# Patient Record
Sex: Male | Born: 1960 | Race: White | Hispanic: No | Marital: Married
Health system: Southern US, Academic
[De-identification: ages and names within clinical notes are randomized; demographics above are authoritative.]

## PROBLEM LIST (undated history)

## (undated) ENCOUNTER — Encounter

## (undated) ENCOUNTER — Ambulatory Visit

## (undated) ENCOUNTER — Encounter: Attending: Family | Primary: Family

## (undated) ENCOUNTER — Encounter: Attending: Infectious Disease | Primary: Infectious Disease

## (undated) ENCOUNTER — Telehealth

## (undated) ENCOUNTER — Ambulatory Visit: Payer: PRIVATE HEALTH INSURANCE

## (undated) ENCOUNTER — Encounter: Attending: Internal Medicine | Primary: Internal Medicine

## (undated) ENCOUNTER — Telehealth: Attending: Family | Primary: Family

## (undated) ENCOUNTER — Ambulatory Visit: Payer: Medicare (Managed Care)

## (undated) ENCOUNTER — Encounter: Attending: Pulmonary Disease | Primary: Pulmonary Disease

## (undated) ENCOUNTER — Encounter
Attending: Student in an Organized Health Care Education/Training Program | Primary: Student in an Organized Health Care Education/Training Program

## (undated) ENCOUNTER — Encounter
Attending: Pharmacist Clinician (PhC)/ Clinical Pharmacy Specialist | Primary: Pharmacist Clinician (PhC)/ Clinical Pharmacy Specialist

## (undated) ENCOUNTER — Ambulatory Visit: Payer: BLUE CROSS/BLUE SHIELD

## (undated) ENCOUNTER — Ambulatory Visit
Payer: BLUE CROSS/BLUE SHIELD | Attending: Student in an Organized Health Care Education/Training Program | Primary: Student in an Organized Health Care Education/Training Program

## (undated) ENCOUNTER — Ambulatory Visit: Payer: BLUE CROSS/BLUE SHIELD | Attending: Internal Medicine | Primary: Internal Medicine

## (undated) ENCOUNTER — Telehealth
Attending: Student in an Organized Health Care Education/Training Program | Primary: Student in an Organized Health Care Education/Training Program

## (undated) ENCOUNTER — Ambulatory Visit
Payer: PRIVATE HEALTH INSURANCE | Attending: Student in an Organized Health Care Education/Training Program | Primary: Student in an Organized Health Care Education/Training Program

## (undated) ENCOUNTER — Other Ambulatory Visit

## (undated) ENCOUNTER — Ambulatory Visit: Payer: PRIVATE HEALTH INSURANCE | Attending: Registered" | Primary: Registered"

## (undated) ENCOUNTER — Ambulatory Visit
Attending: Student in an Organized Health Care Education/Training Program | Primary: Student in an Organized Health Care Education/Training Program

## (undated) ENCOUNTER — Telehealth: Attending: Otolaryngology | Primary: Otolaryngology

## (undated) ENCOUNTER — Encounter: Payer: PRIVATE HEALTH INSURANCE | Attending: Infectious Disease | Primary: Infectious Disease

## (undated) ENCOUNTER — Ambulatory Visit: Payer: BLUE CROSS/BLUE SHIELD | Attending: Family | Primary: Family

## (undated) ENCOUNTER — Encounter: Attending: Dermatology | Primary: Dermatology

## (undated) ENCOUNTER — Ambulatory Visit: Payer: PRIVATE HEALTH INSURANCE | Attending: Internal Medicine | Primary: Internal Medicine

## (undated) ENCOUNTER — Ambulatory Visit: Payer: PRIVATE HEALTH INSURANCE | Attending: MOHS-Micrographic Surgery | Primary: MOHS-Micrographic Surgery

## (undated) ENCOUNTER — Non-Acute Institutional Stay: Payer: PRIVATE HEALTH INSURANCE

## (undated) ENCOUNTER — Ambulatory Visit: Payer: BLUE CROSS/BLUE SHIELD | Attending: Infectious Disease | Primary: Infectious Disease

## (undated) ENCOUNTER — Telehealth: Attending: Internal Medicine | Primary: Internal Medicine

## (undated) ENCOUNTER — Ambulatory Visit: Payer: PRIVATE HEALTH INSURANCE | Attending: Family | Primary: Family

## (undated) ENCOUNTER — Telehealth: Attending: Infectious Disease | Primary: Infectious Disease

## (undated) ENCOUNTER — Encounter: Attending: MOHS-Micrographic Surgery | Primary: MOHS-Micrographic Surgery

## (undated) DIAGNOSIS — J189 Pneumonia, unspecified organism: Secondary | ICD-10-CM

## (undated) DIAGNOSIS — E669 Obesity, unspecified: Secondary | ICD-10-CM

## (undated) DIAGNOSIS — Q348 Other specified congenital malformations of respiratory system: Secondary | ICD-10-CM

## (undated) DIAGNOSIS — R0602 Shortness of breath: Secondary | ICD-10-CM

## (undated) DIAGNOSIS — L309 Dermatitis, unspecified: Secondary | ICD-10-CM

## (undated) HISTORY — PX: APPENDECTOMY: SHX54

## (undated) HISTORY — DX: Dermatitis, unspecified: L30.9

## (undated) HISTORY — DX: Obesity, unspecified: E66.9

## (undated) HISTORY — PX: HERNIA REPAIR: SHX51

## (undated) HISTORY — PX: TONSILLECTOMY: SUR1361

## (undated) HISTORY — DX: Other specified congenital malformations of respiratory system: Q34.8

## (undated) HISTORY — PX: NASAL POLYP EXCISION: SHX2068

## (undated) HISTORY — DX: Pneumonia, unspecified organism: J18.9

---
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
## 2000-08-07 ENCOUNTER — Encounter: Payer: Self-pay | Admitting: Surgery

## 2000-08-07 ENCOUNTER — Encounter (INDEPENDENT_AMBULATORY_CARE_PROVIDER_SITE_OTHER): Payer: Self-pay | Admitting: Specialist

## 2000-08-07 ENCOUNTER — Inpatient Hospital Stay (HOSPITAL_COMMUNITY): Admission: EM | Admit: 2000-08-07 | Discharge: 2000-08-09 | Payer: Self-pay

## 2000-08-08 ENCOUNTER — Encounter: Payer: Self-pay | Admitting: General Surgery

## 2000-08-11 ENCOUNTER — Encounter: Payer: Self-pay | Admitting: Emergency Medicine

## 2000-08-11 ENCOUNTER — Emergency Department (HOSPITAL_COMMUNITY): Admission: EM | Admit: 2000-08-11 | Discharge: 2000-08-11 | Payer: Self-pay | Admitting: Emergency Medicine

## 2000-08-28 ENCOUNTER — Encounter: Admission: RE | Admit: 2000-08-28 | Discharge: 2000-08-28 | Payer: Self-pay | Admitting: Surgery

## 2000-08-28 ENCOUNTER — Encounter: Payer: Self-pay | Admitting: Surgery

## 2002-03-03 ENCOUNTER — Encounter: Payer: Self-pay | Admitting: Emergency Medicine

## 2002-03-03 ENCOUNTER — Emergency Department (HOSPITAL_COMMUNITY): Admission: EM | Admit: 2002-03-03 | Discharge: 2002-03-03 | Payer: Self-pay | Admitting: Emergency Medicine

## 2004-04-21 ENCOUNTER — Ambulatory Visit: Payer: Self-pay | Admitting: Internal Medicine

## 2004-09-15 ENCOUNTER — Ambulatory Visit: Payer: Self-pay | Admitting: Internal Medicine

## 2005-01-12 ENCOUNTER — Ambulatory Visit: Payer: Self-pay | Admitting: Internal Medicine

## 2005-12-19 ENCOUNTER — Ambulatory Visit: Payer: Self-pay | Admitting: Internal Medicine

## 2006-02-06 ENCOUNTER — Ambulatory Visit: Payer: Self-pay | Admitting: Internal Medicine

## 2006-09-18 ENCOUNTER — Ambulatory Visit: Payer: Self-pay | Admitting: Pulmonary Disease

## 2006-11-25 DIAGNOSIS — J310 Chronic rhinitis: Secondary | ICD-10-CM | POA: Insufficient documentation

## 2006-11-25 DIAGNOSIS — Q348 Other specified congenital malformations of respiratory system: Secondary | ICD-10-CM | POA: Insufficient documentation

## 2006-11-25 DIAGNOSIS — J42 Unspecified chronic bronchitis: Secondary | ICD-10-CM | POA: Insufficient documentation

## 2006-12-31 ENCOUNTER — Telehealth (INDEPENDENT_AMBULATORY_CARE_PROVIDER_SITE_OTHER): Payer: Self-pay | Admitting: *Deleted

## 2007-01-30 ENCOUNTER — Encounter: Payer: Self-pay | Admitting: Internal Medicine

## 2007-02-04 ENCOUNTER — Telehealth (INDEPENDENT_AMBULATORY_CARE_PROVIDER_SITE_OTHER): Payer: Self-pay | Admitting: *Deleted

## 2007-02-11 ENCOUNTER — Telehealth: Payer: Self-pay | Admitting: Internal Medicine

## 2007-02-14 ENCOUNTER — Ambulatory Visit: Payer: Self-pay | Admitting: Internal Medicine

## 2007-02-14 DIAGNOSIS — I270 Primary pulmonary hypertension: Secondary | ICD-10-CM

## 2007-02-14 DIAGNOSIS — J329 Chronic sinusitis, unspecified: Secondary | ICD-10-CM | POA: Insufficient documentation

## 2007-04-23 ENCOUNTER — Encounter: Payer: Self-pay | Admitting: Internal Medicine

## 2007-08-08 ENCOUNTER — Telehealth: Payer: Self-pay | Admitting: Internal Medicine

## 2007-11-26 ENCOUNTER — Telehealth (INDEPENDENT_AMBULATORY_CARE_PROVIDER_SITE_OTHER): Payer: Self-pay | Admitting: *Deleted

## 2007-12-31 ENCOUNTER — Encounter: Payer: Self-pay | Admitting: Internal Medicine

## 2008-01-06 ENCOUNTER — Ambulatory Visit: Payer: Self-pay | Admitting: Internal Medicine

## 2008-01-12 ENCOUNTER — Encounter: Payer: Self-pay | Admitting: Internal Medicine

## 2008-01-15 ENCOUNTER — Ambulatory Visit: Payer: Self-pay | Admitting: Internal Medicine

## 2008-01-16 DIAGNOSIS — R0602 Shortness of breath: Secondary | ICD-10-CM | POA: Insufficient documentation

## 2008-01-28 ENCOUNTER — Telehealth: Payer: Self-pay | Admitting: Internal Medicine

## 2008-02-02 ENCOUNTER — Telehealth (INDEPENDENT_AMBULATORY_CARE_PROVIDER_SITE_OTHER): Payer: Self-pay | Admitting: *Deleted

## 2008-02-03 ENCOUNTER — Encounter: Payer: Self-pay | Admitting: Internal Medicine

## 2008-02-03 LAB — CONVERTED CEMR LAB
ALT: 22 units/L (ref 0–53)
AST: 22 units/L (ref 0–37)
Albumin: 3.7 g/dL (ref 3.5–5.2)
Alkaline Phosphatase: 50 units/L (ref 39–117)
BUN: 13 mg/dL (ref 6–23)
Basophils Absolute: 0 10*3/uL (ref 0.0–0.1)
Basophils Relative: 0.3 % (ref 0.0–3.0)
Bilirubin, Direct: 0.1 mg/dL (ref 0.0–0.3)
CO2: 31 meq/L (ref 19–32)
Calcium: 9.4 mg/dL (ref 8.4–10.5)
Chloride: 108 meq/L (ref 96–112)
Creatinine, Ser: 1.1 mg/dL (ref 0.4–1.5)
Eosinophils Absolute: 0.1 10*3/uL (ref 0.0–0.7)
Eosinophils Relative: 2.3 % (ref 0.0–5.0)
GFR calc Af Amer: 93 mL/min
GFR calc non Af Amer: 77 mL/min
Glucose, Bld: 93 mg/dL (ref 70–99)
HCT: 43.5 % (ref 39.0–52.0)
Hemoglobin: 15.2 g/dL (ref 13.0–17.0)
Lymphocytes Relative: 30.3 % (ref 12.0–46.0)
MCHC: 35 g/dL (ref 30.0–36.0)
MCV: 90.1 fL (ref 78.0–100.0)
Monocytes Absolute: 0.4 10*3/uL (ref 0.1–1.0)
Monocytes Relative: 7.7 % (ref 3.0–12.0)
Neutro Abs: 3.5 10*3/uL (ref 1.4–7.7)
Neutrophils Relative %: 59.4 % (ref 43.0–77.0)
Platelets: 173 10*3/uL (ref 150–400)
Potassium: 4.7 meq/L (ref 3.5–5.1)
RBC: 4.83 M/uL (ref 4.22–5.81)
RDW: 12.1 % (ref 11.5–14.6)
Sodium: 143 meq/L (ref 135–145)
Total Bilirubin: 1 mg/dL (ref 0.3–1.2)
Total Protein: 6.7 g/dL (ref 6.0–8.3)
WBC: 5.7 10*3/uL (ref 4.5–10.5)

## 2008-02-04 ENCOUNTER — Encounter: Payer: Self-pay | Admitting: Internal Medicine

## 2008-04-06 ENCOUNTER — Encounter: Payer: Self-pay | Admitting: Internal Medicine

## 2008-08-05 ENCOUNTER — Ambulatory Visit: Payer: Self-pay | Admitting: Internal Medicine

## 2008-09-24 ENCOUNTER — Telehealth: Payer: Self-pay | Admitting: Internal Medicine

## 2008-10-19 ENCOUNTER — Telehealth (INDEPENDENT_AMBULATORY_CARE_PROVIDER_SITE_OTHER): Payer: Self-pay | Admitting: *Deleted

## 2008-12-14 ENCOUNTER — Encounter: Payer: Self-pay | Admitting: Internal Medicine

## 2008-12-28 ENCOUNTER — Encounter: Payer: Self-pay | Admitting: Internal Medicine

## 2009-04-12 ENCOUNTER — Encounter: Payer: Self-pay | Admitting: Internal Medicine

## 2009-06-09 ENCOUNTER — Ambulatory Visit: Payer: Self-pay | Admitting: Internal Medicine

## 2010-01-09 ENCOUNTER — Telehealth (INDEPENDENT_AMBULATORY_CARE_PROVIDER_SITE_OTHER): Payer: Self-pay | Admitting: *Deleted

## 2010-01-10 ENCOUNTER — Ambulatory Visit: Payer: Self-pay | Admitting: Internal Medicine

## 2010-03-14 NOTE — Progress Notes (Signed)
Summary: cough- req to see cy or rx  Phone Note Call from Patient   Caller: Spouse-karen goldsmith Call For: young Summary of Call: pt still coughing green phlegm x 2 wks. levaquin not helping. should pt see dr young or can a rx be called in. pt also having low grade fevers as well "off and on". [facility] [address]. call karen at [phone] asap (she hasn't gone to lunch yet).  Initial call taken by: Tivis Ringer, CNA,  January 09, 2010 12:16 PM  Follow-up for Phone Call        spoke with patients wife-pt is having sinus troubles and congestion increase in lungs (fluid sounds); has had two rds of levaquin and not helping. I offered appt for today but pt can not leave work-id off on Tuesdays so appt was set up for Tuesday 01-10-10 at 130pm.Katie [facility] CMA  January 09, 2010 12:22 PM

## 2010-03-14 NOTE — Assessment & Plan Note (Signed)
Summary: Jesus Cummings   Primary Provider/Referring Provider:  none  CC:  Follow up visit.  History of Present Illness:  01/06/08-  Cystiic fibrosis, chronic bronchitis, chronic sinusitis He feels key is to keep rhinosinusitis controlled- the lungs follow that. Neti pot does help. Levaquin helps sinuses. Cipro clears chest exacerbations better. 21 days of Tobi does help, also using pulmozyme, neti pot. Also swimming regularly. Got flu shot. Sals and Mentos make his nose run. No trend seen He is interested in Cipro as nasal therapy.   08/05/08- Cystic fibrosis, chronic bronchitis, chronic sinusitis Had difficulty last year getting electronic subnmission of prescriptions to go through- discussed. He continues to swim and "sprints" seem to help him open his airway.. Fall and Spring are usually worst times. He is doing fairly well now in mid summer. He has not had problems with vitamins, diet etc. Nose and chest feel really clear now. last used TOBI 2 days ago. 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 PFT- mild to mod obstrucive dis, FEV1/ FVC 0.65, NL volumes and DLCO. 6 MWT- 94, 93, 96% on room air, 673 meters. CXR- over-expansion with basilar scarring.  June 09, 2009- Cystic fibrosis, chronic bronchitis, chronic sinusitis..................Marland Kitchenwife here No major problems in past year. Did need Levaquin a couple of times for infection flares, especialy in sinuses. Coughs "fluid" which is white. Wheezing for last 2 days, which is when he notes any dyspnea. He swims daily.  He still uses Tobi, 3 weeks on and off. We discussed new meds in the pipeline.  Current Medications (verified): 1)  Combivent 103-18 Mcg/act  Aero (Albuterol-Ipratropium) .... 2 Puffs Three Times A Day 2)  Pulmicort Flexhaler 180 Mcg/act  Inha (Budesonide (Inhalation)) .... 4 Puffs Two Times A Day 3)  Guafenisin/pse .Marland Kitchen.. 1 Tab Two Times A Day 4)  Tobi 300 Mg/5ml  Nebu (Tobramycin)  .... Use Twice A Day For 3 Weeks On Then 3 Weeks Off 5)  Nasonex 50 Mcg/act  Susp (Mometasone Furoate) .... 2 Sprays Each Nostril Two Times A Day 6)  Pulmozyme 1 Mg/ml  Soln (Dornase Alfa) .Marland Kitchen.. 1 Neb Daily 7)  Portable Nebulizer and Supplies  Allergies (verified): No Known Drug Allergies  Past History:  Past Medical History: Last updated: Mar 01, 2007 Cystic fibrosis Appendectomy Tonsils Myringotomy tubes Sinus surgery  Past Surgical History: Last updated: 08/05/2008 Nasal polypectomy Appendectomy hernia repair Tonsillectomy  Family History: Last updated: 2007/03/01 No FHX of CF GF died Lung Ca- smoker F- MI age 61 No exposure TB- all PPD's neg  Social History: Last updated: 2007/03/01 Patient never smoked.  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    Vital Signs:  Patient profile:   50 year old male Height:      69 inches Weight:      194.50 pounds BMI:     28.83 O2 Sat:      97 % on Room air Pulse rate:   81 / minute BP sitting:   112 / 70  (left arm) Cuff size:   regular  Vitals Entered By: Reynaldo Minium CMA (June 09, 2009 9:48 AM)  O2 Flow:  Room air  Physical Exam  Additional Exam:  General: A/Ox3; pleasant and  cooperative, NAD, SKIN: no rash, lesions NODES: no lymphadenopathy HEENT: [place]/AT, EOM- WNL, Conjuctivae- clear, PERRLA, periorbital edema and shadowing, TM-WNL, Nose- mucosa normal, no visible polyps, Throat- clear and wnl NECK: Supple w/ fair ROM, JVD- none, normal carotid impulses w/o bruits Thyroid-  CHEST: Trace wheeze right scapula, trace wheeze at left base HEART: RRR, no m/g/r heard ABDOMEN: Soft and nl;  XBM:WUXL,  nl pulses, no edema  NEURO: Grossly intact to observation      Impression & Recommendations:  Problem # 1:  SINUSITIS, CHRONIC (ICD-473.9) Nasonex remains effective. Occasional levaquin has worked  Problem # 2:  CYSTIC FIBROSIS (ICD-277.00) He has a mild chtronic bronchitis pattern. We will continue meds.  Medications Added to Medication List This Visit: 1)  Lloyd Huger Med Sinus Rinse  .Marland Kitchen.. 2 packs three times a day 2)  Mucinex D [phone] Mg Xr12h-tab (Pseudoephedrine-guaifenesin) .Marland Kitchen.. 1 two times a day  Other Orders: Est. Patient Level III (24401)  Patient Instructions: 1)  Please schedule a follow-up appointment in 1 year. Call sooner if needed. 2)  Meds refilled Prescriptions: PULMOZYME 1 MG/ML  SOLN (DORNASE ALFA) 1 neb daily  #30 x prn   Entered and Authorized by:   Waymon Budge MD   Signed by:   Waymon Budge MD on 06/09/2009   Method used:   Print then Give to Patient   RxID:   [id number] NASONEX 50 MCG/ACT  SUSP (MOMETASONE FUROATE) 2 sprays each nostril two times a day  #2 x prn   Entered and Authorized by:   Waymon Budge MD   Signed by:   Waymon Budge MD on 06/09/2009   Method used:   Print then Give to Patient   RxID:   [id number] TOBI 300 MG/5ML  NEBU (TOBRAMYCIN) Use twice a day for 3 weeks on then 3 weeks off  #42 x prn   Entered and Authorized by:   Waymon Budge MD   Signed by:   Waymon Budge MD on 06/09/2009   Method used:   Print then Give to Patient   RxID:   [id number] PULMICORT FLEXHALER 180 MCG/ACT  INHA (BUDESONIDE (INHALATION)) 4 puffs two times a day  #2 x prn   Entered and Authorized by:   Waymon Budge MD   Signed by:   Waymon Budge MD on 06/09/2009   Method used:   Print then Give to Patient   RxID:   [id number] COMBIVENT 103-18 MCG/ACT  AERO (ALBUTEROL-IPRATROPIUM) 2 puffs three times a day  #1 x prn   Entered and Authorized by:   Waymon Budge MD   Signed by:   Waymon Budge MD on 06/09/2009    Method used:   Print then Give to Patient   RxID:   [id number] VPXTGGY D [phone] MG XR12H-TAB (PSEUDOEPHEDRINE-GUAIFENESIN) 1 two times a day  #60 x prn   Entered and Authorized by:   Waymon Budge MD   Signed by:   Waymon Budge MD on 06/09/2009   Method used:   Print then Give to Patient   RxID:   [id number] NEIL MED SINUS RINSE 2 packs three times a day  #180 x prn   Entered and Authorized by:   Waymon Budge MD   Signed by:   Waymon Budge MD on 06/09/2009   Method used:   Print then Give to Patient   RxID:   [id number]

## 2010-03-14 NOTE — Assessment & Plan Note (Signed)
Summary: acute sick visit-congestion/sinus infection/kcw   Primary Nayelie Gionfriddo/Referring Aarica Wax:  none  CC:  Acute visit-sinus troubles and chest congestion-has had 2 rounds of abx(Levaquin); not helping.Marland Kitchen  History of Present Illness: 08/05/08- Cystic fibrosis, chronic bronchitis, chronic sinusitis Had difficulty last year getting electronic subnmission of prescriptions to go through- discussed. He continues to swim and "sprints" seem to help him open his airway.. Fall and Spring are usually worst times. He is doing fairly well now in mid summer. He has not had problems with vitamins, diet etc. Nose and chest feel really clear now. last used TOBI 2 days ago. 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 PFT- mild to mod obstrucive dis, FEV1/ FVC 0.65, NL volumes and DLCO. 6 MWT- 94, 93, 96% on room air, 673 meters. CXR- over-expansion with basilar scarring.  June 09, 2009-Cystic fibrosis, chronic bronchitis, chronic sinusitis............Marland Kitchenwife here No major problems in past year. Did need Levaquin a couple of times for infection flares, especialy in sinuses. Coughs "fluid" which is white. Wheezing for last 2 days, which is when he notes any dyspnea. He swims daily.  He still uses Tobi, 3 weeks on and off. We discussed new meds in the pipeline.  January 10, 2010 Cystic fibrosis, chronic bronchitis, chronic sinusitis..........Marland Kitchenwife here Nurse-CC: Acute visit-sinus troubles and chest congestion-has had 2 rounds of abx(Levaquin); not helping. Acute visit- 3 weeks  "increased fluid" from nose and chest- green.. Few headaches, some chills/ achey,  Now on Tobi at his regular cycle and continues mucinex, vibrating vest, pulmozyme and swimming. He associates this exacerbation with Fall and Spring.   Preventive Screening-Counseling & Management  Alcohol-Tobacco     Smoking Status: never  Current Medications (verified): 1)  Combivent 103-18 Mcg/act   Aero (Albuterol-Ipratropium) .... 2 Puffs Three Times A Day 2)  Pulmicort Flexhaler 180 Mcg/act  Inha (Budesonide (Inhalation)) .... 4 Puffs Two Times A Day 3)  Tobi 300 Mg/59ml  Nebu (Tobramycin) .... Use Twice A Day For 3 Weeks On Then 3 Weeks Off 4)  Nasonex 50 Mcg/act  Susp (Mometasone Furoate) .... 2 Sprays Each Nostril Two Times A Day 5)  Pulmozyme 1 Mg/ml  Soln (Dornase Alfa) .Marland Kitchen.. 1 Neb Daily 6)  Portable Nebulizer and Supplies 7)  Neil Med Sinus Rinse .Marland Kitchen.. 2 Packs Three Times A Day 8)  Mucinex D [phone] Mg Xr12h-Tab (Pseudoephedrine-Guaifenesin) .Marland Kitchen.. 1 Two Times A Day 9)  Levaquin 500 Mg Tabs (Levofloxacin) .... Take One Tablet By Mouth Once Daily  Allergies (verified): No Known Drug Allergies  Past History:  Past Medical History: Last updated: 04-Mar-2007 Cystic fibrosis Appendectomy Tonsils Myringotomy tubes Sinus surgery  Past Surgical History: Last updated: 08/05/2008 Nasal polypectomy Appendectomy hernia repair Tonsillectomy  Family History: Last updated: 2007-03-04 No FHX of CF GF died Lung Ca- smoker F- MI age 79 No exposure TB- all PPD's neg  Social History: Last updated: Mar 04, 2007 Patient never smoked.  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  The patient denies shortness of breath at rest, coughing up blood, chest pain, irregular heartbeats, acid heartburn, indigestion, loss of appetite, weight change, abdominal pain, difficulty swallowing, sore throat, tooth/dental problems, headaches, sneezing, itching, hand/feet swelling, rash, change in color of mucus, and fever.    Vital Signs:  Patient profile:   50 year old male Height:      69 inches Weight:      196.25 pounds BMI:     29.09 O2 Sat:      93 % on Room air Pulse rate:    84 / minute BP sitting:   116 / 72  (left arm) Cuff size:   regular  Vitals Entered By: Reynaldo Minium CMA (January 10, 2010 1:42 PM)  O2 Flow:  Room air CC: Acute visit-sinus troubles and chest congestion-has had 2 rounds of abx(Levaquin); not helping.   Physical Exam  Additional Exam:  General: A/Ox3; pleasant and cooperative, NAD, SKIN: no rash, lesions NODES: no lymphadenopathy HEENT: [place]/AT, EOM- WNL, Conjuctivae- clear, PERRLA, periorbital edema and shadowing, TM-WNL, Nose- mucosa normal, no visible polyps, Throat- clear and wnl NECK: Supple w/ fair ROM, JVD- none, normal carotid impulses w/o bruits Thyroid-  CHEST: Coarse rhonchi bilaterally HEART: RRR, no m/g/r heard ABDOMEN: Soft and nl;  ZOX:WRUE, nl pulses, no edema  NEURO: Grossly intact to observation      Impression & Recommendations:  Problem # 1:  BRONCHITIS, CHRONIC (ICD-491.9)  Exacerbation of chronic bronchitis with impression that he may have an unidentified CF variant. We are going to try Factive. We reviewed his meds and response to exacerbations.  Consider trial of Daliresp as an alternative to reduce episodes fo bronchitis.   Medications Added to Medication List This Visit: 1)  Factive 320 Mg Tabs (Gemifloxacin mesylate) .Marland Kitchen.. 1 daily  Other Orders: Est. Patient Level III (45409)  Patient Instructions: 1)  Please schedule a follow-up appointment in 6 months. 2)  Try Factive as an antibiotic, continuing the usual treatments as you have done. Call if problems  Prescriptions: FACTIVE 320 MG TABS (GEMIFLOXACIN MESYLATE) 1 daily  #7 x 0   Entered and Authorized by:   Waymon Budge MD   Signed by:   Waymon Budge MD on 01/10/2010   Method used:   Print then Give to Patient   RxID:   [id number]

## 2010-03-14 NOTE — Letter (Signed)
Summary: [facility]   Imported By: Lester Hudson 04/14/2009 10:07:08  _____________________________________________________________________  External Attachment:    Type:   Image     Comment:   External Document

## 2010-05-09 ENCOUNTER — Other Ambulatory Visit: Payer: Self-pay | Admitting: Internal Medicine

## 2010-05-11 ENCOUNTER — Telehealth: Payer: Self-pay | Admitting: Internal Medicine

## 2010-05-11 NOTE — Telephone Encounter (Signed)
This was sent as a escribe med and was just appoved by CDY-left message at number given that this was taken care of.

## 2010-05-11 NOTE — Telephone Encounter (Signed)
Please advise if okay to refill. Thanks.  

## 2010-05-11 NOTE — Telephone Encounter (Signed)
Spoke with pt's spouse.  She states that pt has been c/o prod cough with green sputum and lowgrade fever. Also has green nasal d/c. Would like rx for cipro to be sent.  Please advise thanks. NKDA

## 2010-05-11 NOTE — Telephone Encounter (Signed)
Ok to refill this time 

## 2010-05-11 NOTE — Telephone Encounter (Signed)
LMOMTCB x 1 

## 2010-06-02 ENCOUNTER — Telehealth: Payer: Self-pay | Admitting: Internal Medicine

## 2010-06-02 ENCOUNTER — Telehealth: Payer: Self-pay

## 2010-06-02 MED ORDER — CIPROFLOXACIN HCL 500 MG PO TABS: ORAL_TABLET | ORAL | Status: DC

## 2010-06-02 NOTE — Telephone Encounter (Signed)
Ok to send per KC---rx was sent in under CY name--katie called and spoke with pts wife and she is aware of rx for cipro sent to the pharmacy.

## 2010-06-02 NOTE — Telephone Encounter (Signed)
lmomtcb for pts spouse karen

## 2010-06-02 NOTE — Telephone Encounter (Signed)
Jesus Cummings aware rx was sent to pharmacy

## 2010-06-02 NOTE — Telephone Encounter (Signed)
Returned call--stated that nothing is helping with the fluid in the left lung-requesting a refill of the cipro.  Stated that the pollen may be causing some of these symptoms but feels the cipro would clear all of this up.  Please advise if ok to send in refill. thanks

## 2010-06-02 NOTE — Telephone Encounter (Signed)
ERROR- WRONG DR. Tivis Ringer

## 2010-06-02 NOTE — Telephone Encounter (Signed)
Ok to send in cipro 500mg  one bid for 10 days,no fills

## 2010-06-07 ENCOUNTER — Encounter: Payer: Self-pay | Admitting: Internal Medicine

## 2010-06-09 ENCOUNTER — Ambulatory Visit: Payer: Self-pay | Admitting: Internal Medicine

## 2010-06-15 ENCOUNTER — Telehealth: Payer: Self-pay | Admitting: Internal Medicine

## 2010-06-15 MED ORDER — TOBRAMYCIN 300 MG/5ML IN NEBU: 300.0000 mg | INHALATION_SOLUTION | Freq: Two times a day (BID) | RESPIRATORY_TRACT | Status: DC

## 2010-06-15 NOTE — Telephone Encounter (Signed)
RX printed and placed on CDY for signature. Pt NOS appt on 06/09/2010. Unable to reach pt to rescheduled a f/u appt. Pls advise.

## 2010-06-15 NOTE — Telephone Encounter (Signed)
ATC x 1 NA WCB 

## 2010-06-15 NOTE — Telephone Encounter (Signed)
ATC x 2 NA  WCB 

## 2010-06-15 NOTE — Telephone Encounter (Signed)
Okay per CDY to fax prescription. We will try to reach the patient and make sure he has an appt and explain that he needs to keep his follow-up.

## 2010-06-15 NOTE — Telephone Encounter (Signed)
[facility] calling to verify prescription for TOBI. Done.

## 2010-06-16 NOTE — Telephone Encounter (Signed)
ATC pt NA and no option to leave msg, WCB 

## 2010-06-19 NOTE — Telephone Encounter (Signed)
ATC pt.  NA. And no options to leave a message.  WCB

## 2010-06-20 ENCOUNTER — Encounter: Payer: Self-pay | Admitting: *Deleted

## 2010-06-20 NOTE — Telephone Encounter (Signed)
We have tried on several occasions to reach the pt via phone. We have not been able to leave a msg. Will send a letter requesting that the pt call for an appt with CDY. Will close phone note.

## 2010-06-21 ENCOUNTER — Other Ambulatory Visit: Payer: Self-pay | Admitting: Internal Medicine

## 2010-06-21 ENCOUNTER — Telehealth: Payer: Self-pay | Admitting: Internal Medicine

## 2010-06-21 NOTE — Telephone Encounter (Signed)
?   What meds? LMTCB

## 2010-06-22 ENCOUNTER — Telehealth: Payer: Self-pay | Admitting: Internal Medicine

## 2010-06-22 MED ORDER — IPRATROPIUM-ALBUTEROL 18-103 MCG/ACT IN AERO: 2.0000 | INHALATION_SPRAY | Freq: Three times a day (TID) | RESPIRATORY_TRACT | Status: DC

## 2010-06-22 MED ORDER — BUDESONIDE 180 MCG/ACT IN AEPB: 4.0000 | INHALATION_SPRAY | Freq: Two times a day (BID) | RESPIRATORY_TRACT | Status: DC

## 2010-06-22 MED ORDER — MOMETASONE FUROATE 50 MCG/ACT NA SUSP: 2.0000 | Freq: Two times a day (BID) | NASAL | Status: DC

## 2010-06-22 NOTE — Telephone Encounter (Signed)
Spoke with pt and advised will send refills, but needs rov sched - sched appt with CDY for Monday 06/26/10. Rx refills sent to pharm.

## 2010-06-22 NOTE — Telephone Encounter (Signed)
This has already been addressed.  See phone note from today (5/10) for complete details.

## 2010-06-26 ENCOUNTER — Encounter: Payer: Self-pay | Admitting: Internal Medicine

## 2010-06-26 ENCOUNTER — Ambulatory Visit (INDEPENDENT_AMBULATORY_CARE_PROVIDER_SITE_OTHER): Payer: Self-pay | Admitting: Internal Medicine

## 2010-06-26 VITALS — BP 118/78 | HR 102 | Ht 69.0 in | Wt 193.6 lb

## 2010-06-26 DIAGNOSIS — I270 Primary pulmonary hypertension: Secondary | ICD-10-CM

## 2010-06-26 DIAGNOSIS — J42 Unspecified chronic bronchitis: Secondary | ICD-10-CM

## 2010-06-26 DIAGNOSIS — J329 Chronic sinusitis, unspecified: Secondary | ICD-10-CM

## 2010-06-26 NOTE — Patient Instructions (Signed)
We will continue present treatment. Please call as needed.

## 2010-06-26 NOTE — Assessment & Plan Note (Signed)
Stable within his range, with potential for pseudomonas exacerbation

## 2010-06-26 NOTE — Progress Notes (Signed)
  Subjective:    Patient ID: Jesus Cummings, male    DOB: Aug 29, 1960, 50 y.o.   MRN: [id number]  HPI 28 yoM never smoker followed for chronic sinusitis and a CF-variant with chronic bronchitis, complicated by hx pulmonary hypertension.  Last here January 10, 2010. He has been getting his aerosol Tobi recently through a face mask to deliver it to nose and lower airway. He wants to try that for his pulmozyme as well. We discussed salinity of aerosols and airway irritability.  Swimming keeps his airways open, otherwise cough gets productive. He does use Flutter and VEST for pulmonary toilet. Denies change in exercise tolerance over time. Cipro has been his best fall-back drug. We talked about newer drugs. Worst time seems to be Fall and Spring. Most likely it is virus infections rather than seasonal allergy at those times.   Review of Systems Constitutional:   No weight loss, night sweats,  Fevers, chills, fatigue, lassitude. HEENT:   No headaches,  Difficulty swallowing,  Tooth/dental problems,  Sore throat,                No sneezing, itching, ear ache,  post nasal drip,   CV:  No chest pain,  Orthopnea, PND, swelling in lower extremities, anasarca, dizziness, palpitations  GI  No heartburn, indigestion, abdominal pain, nausea, vomiting, diarrhea, change in bowel habits, loss of appetite  Resp: Stable  shortness of breath with exertion or at rest. ,  No coughing up of blood.  No change in color of mucus.  No wheezing.  No chest wall deformity  Skin: no rash or lesions.  GU: no dysuria, change in color of urine, no urgency or frequency.  No flank pain.  MS:  No joint pain or swelling.  No decreased range of motion.  No back pain.  Psych:  No change in mood or affect. No depression or anxiety.  No memory loss.      Objective:   Physical Exam General- Alert, Oriented, Affect-appropriate, Distress- none acute  Skin- rash-none, lesions- none, excoriation- none  Lymphadenopathy-  none  Head- atraumatic  Eyes- Gross vision intact, PERRLA, conjunctivae clear secretions  Ears- Hearing, canals, Tm - normal  Nose- Clear,  No- Septal dev, mucus, polyps, erosion, perforation   Throat- Mallampati II , mucosa clear , drainage- none, tonsils- atrophic  Neck- flexible , trachea midline, no stridor , thyroid nl, carotid no bruit  Chest - symmetrical excursion , unlabored     Heart/CV- RRR , no murmur , no gallop  , no rub, nl s1 s2                     - JVD- none , edema- none, stasis changes- none, varices- none     Lung -Raspy breath sounds, fine rhonchi, unlabored           cough- none , dullness-none, rub- none     Chest wall-  Abd- tender-no, distended-no, bowel sounds-present, HSM- no  Br/ Gen/ Rectal- Not done, not indicated  Extrem- cyanosis- none, clubbing, none, atrophy- none, strength- nl  Neuro- grossly intact to observation         Assessment & Plan:

## 2010-06-27 ENCOUNTER — Encounter: Payer: Self-pay | Admitting: Internal Medicine

## 2010-06-27 ENCOUNTER — Other Ambulatory Visit: Payer: Self-pay | Admitting: Internal Medicine

## 2010-06-27 NOTE — Assessment & Plan Note (Signed)
He realizes sinus disease tracks lower respiratory problems and he works hard to control sinusitis with measures described.

## 2010-06-27 NOTE — Assessment & Plan Note (Signed)
Will want to reassess as newer interventions make it relevant.

## 2010-06-27 NOTE — Assessment & Plan Note (Signed)
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

## 2010-06-28 ENCOUNTER — Telehealth: Payer: Self-pay | Admitting: Internal Medicine

## 2010-06-28 NOTE — Telephone Encounter (Signed)
Pr CY-okay with him for patient to use [facility] brand-spoke with pharmacy and they are aware and will relay message to patient.

## 2010-06-28 NOTE — Telephone Encounter (Signed)
Spoke [facility] and they states pt is wanting to use [facility] brand nasal wash instead of the hypertonic nasal kit if Dr. Maple Hudson was okay with this. [facility] states the [facility] is cheaper and  it is the same medication. Please advise if okay Dr. Maple Hudson. Thanks  Carver Fila, CMA

## 2010-06-28 NOTE — Telephone Encounter (Signed)
Refill sent and pt aware.Jesus Cummings

## 2010-06-29 ENCOUNTER — Telehealth: Payer: Self-pay | Admitting: Internal Medicine

## 2010-06-29 ENCOUNTER — Other Ambulatory Visit: Payer: Self-pay | Admitting: Internal Medicine

## 2010-06-29 DIAGNOSIS — J40 Bronchitis, not specified as acute or chronic: Secondary | ICD-10-CM

## 2010-06-29 MED ORDER — CIPROFLOXACIN HCL 500 MG PO TABS: ORAL_TABLET | ORAL | Status: DC

## 2010-06-29 NOTE — Telephone Encounter (Signed)
Spoke with pt's spouse. She states that pt is requesting abx other than cipro. He was just seen 5/14 and has since then developed a prod cough with green sputum. Pls advise thanks

## 2010-06-29 NOTE — Telephone Encounter (Signed)
Per CY if pt can wait until tomorrow then he wants him to come by and leave a sputum for c+s for bronchitis, and then start cipro 500 #20 one twice daily.  I called and spoke with pt wife and she is aware of this and they will come by and leave sample tomorrow. [facility] [address]. They are aware not to start abx until after specimen is left.  Carron Curie, CMA

## 2010-06-30 ENCOUNTER — Other Ambulatory Visit: Payer: BC Managed Care – PPO

## 2010-06-30 DIAGNOSIS — J40 Bronchitis, not specified as acute or chronic: Secondary | ICD-10-CM

## 2010-06-30 NOTE — Discharge Summary (Signed)
[facility]  Patient:    YAREL, RUSHLOW                   MRN: [id number] Adm. Date:  [id number] Disc. Date: [id number] Attending:  Tobey Bride CC:         [facility]  [facility] D. Maple Hudson, M.D.   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  Appendicitis was diagnosed.  He was taken to the operating room where a necrotic appendix was removed laparoscopically.  Postoperatively, he had some pulmonary complications.  This was not unexpected since he is followed over at [facility] by Dr. Otto Herb with an adult  cystic-fibrosis- like syndrome.  Despite vigorous pulmonary toilet, this did prolong his stay a bit, but he was ready for discharge on June 28 and was discharged by Dr. Johna Sheriff.  He did bounce back to the emergency room on June 30 where he saw Dr. Jamey Ripa for fever postoperatively and was felt to have possibly some pneumonitis.  FINAL DIAGNOSIS:  Acute suppurative appendicitis. DD:  09/13/00 TD:  09/13/00 Job: [id number] LKG/[id number]

## 2010-06-30 NOTE — Letter (Signed)
February 15, 2006    [facility]  [address] [place], [place] [address]   RE:  Jesus, Cummings  MRN:  [id number]  /  DOB:  Jan 18, 1961   To Whom It May Concern:   This is a letter of medical authorization request to continue  prescribing Pulmozyme and Tobi.  Jesus Cummings has been under my medical  care since 2002 for cystic fibrosis with chronic bronchitis and allergic  rhinitis with recurrent rhinosinusitis.  He has achieved relative  stability, given the expected chronic progression of his disease.  Maintenance therapy has been extremely important using Pulmozyme by  nebulizer once daily and inhaled tobramycin (Tobi) inhaled by nebulizer  b.i.d. for 3 weeks on and 3 weeks off on a chronic basis.  The Tobi is  provided as 300 mg per 5-mL ampule with packs of 56; the Pulmozyme is  provided as 1-mg/mL ampules using one daily, or 30-31 per month.  His  most recent visit with me was February 14, 2007 and I will continue to see  him every 4 months unless needed sooner.   Thank you for your cooperation and support with his care.    Sincerely,      Clinton D. Maple Hudson, MD, Tonny Bollman, FACP  Electronically Signed    CDY/MedQ  DD: 02/16/2007  DT: 02/16/2007  Job #: [id number]

## 2010-06-30 NOTE — Op Note (Signed)
[facility]  Patient:    Jesus Cummings, Jesus Cummings                   MRN: [id number] Proc. Date: 08/07/00 Adm. Date:  [id number] Attending:  Katha Cabal CC:         Dr. Otto Herb, [facility], Dept. of Pulmonary Med.   Operative Report  PREOPERATIVE DIAGNOSES:  Acute appendicitis.  POSTOPERATIVE DIAGNOSES:  Acute necrotic suppurative appendicitis.  PROCEDURE:  Laparoscopic appendectomy.  SURGEON:  Luretha Murphy, M.D.  ANESTHESIA:  General endotracheal.  INDICATIONS FOR PROCEDURE:  Jesus Cummings is a 50 year old gentleman who was seen at [facility] on the afternoon of June 26 with right lower quadrant abdominal pain for at least a day and a half. 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 The patient was taken to the OR at this time for laparoscopic appendectomy.  DESCRIPTION OF PROCEDURE:  After general anesthesia was administered a Foley catheter was inserted and the abdomen was prepped with Betadine and draped sterilely. I made a longitudinal incision down into the umbilicus and found a small umbilical hernia which I opened and used as a port for the Hasson. A pursestring suture was placed and the Hasson was inserted and the abdomen was insufflated. A 5 mm was placed in the right upper quadrant and another 12 was placed in the left lower quadrant in an oblique fashion. With these three ports, I was able to visualize a very necrotic appearing appendix and I took pictures for the chart.  There was a little bit of purulence which was around this area and I sucked this out and mobilized a very suppurative gray black appendix. I mobilized this and put an endoloop on the tip and then dissected free the base and transected that first with the endoGIA. Two subsequent applications of the  endoGIA through the mesentery enabled me to mobilize it and place it in a bag. The bag was brought out through the umbilicus without difficulty although I did have to slightly enlarge the hole. Prior to doing this, I did inject the port sites with some Marcaine. I repaired the umbilical defect with two sutures including a figure-of-eight suture of #0 Prolene and a simple suture of #0 Prolene. The wounds were all irrigated. The port sites were examined from within and no bleeding was seen. I looked at the appendiceal stump again and no bleeding was seen. The abdomen was deflated. The patient seemed to tolerate the procedure well and was taken to the recovery room in satisfactory condition.  FINAL DIAGNOSES:   Acute appendicitis status post laparoscopic appendectomy. D:  08/07/00 TD:  08/07/00 Job: [id number]

## 2010-06-30 NOTE — Assessment & Plan Note (Signed)
[facility]                               PULMONARY OFFICE NOTE   MASSIE, MEES                   MRN:          [id number]  DATE:12/19/2005                            DOB:          17-Oct-1960    HISTORY OF PRESENT ILLNESS:  The patient is a 50 year old white male patient  of Dr. Roxy Cedar who has a known history of cystic fibrosis, chronic  bronchitis, and allergic rhinitis, presents for an acute office visit.  The  patient complains of a one week history of increased productive cough with  thick green sputum, nasal congestion, and low grade fevers.  The patient is  maintained on Pulmicort 4 puffs twice daily, Combivent 2 puffs t.i.d.,  nebulized tobramycin three weeks on, three weeks off, Pulmozyme daily, and  Ental 2 puffs t.i.d.  The patient has not been seen in greater than nine  months.  His last antibiotic was greater than six  months ago.   PAST MEDICAL HISTORY:  Reviewed.   CURRENT MEDICATIONS:  Reviewed.   PHYSICAL EXAMINATION:  GENERAL:  The patient is white male in no acute distress.  VITAL SIGNS:  He is afebrile with stable vital signs, O2 saturation 94% on  room air.  HEENT:  Nasal mucosa is pale and slightly swollen.  Posterior pharynx is  clear.  NECK:  Supple without adenopathy.  No JVD.  LUNGS:  Coarse rhonchi bilaterally, left greater than right.  CARDIAC:  regular rate.  ABDOMEN:  Soft and benign.  EXTREMITIES:  Warm without any edema.   IMPRESSION/PLAN:  Acute tracheobronchitis with underlying cystic fibrosis.  The patient will begin Cipro 500 mg b.i.d. x14 days.  Continue on his  current pulmonary hygiene regimen.  We will obtain a sputum culture prior to  beginning antibiotic coverage today.  He will be checked here in 2-4 weeks  with Dr. Maple Hudson or sooner if needed.      Rubye Oaks, NP  Electronically Signed      Clinton D. Maple Hudson, MD, Tonny Bollman, FACP  Electronically Signed   TP/MedQ  DD: 12/19/2005  DT:  12/19/2005  Job #: [id number]

## 2010-06-30 NOTE — Assessment & Plan Note (Signed)
[facility]                             PULMONARY OFFICE NOTE   Jesus Cummings, Jesus Cummings                     MRN:          [id number]  DATE:01/12/2005                            DOB:          16-Feb-1960    PROBLEMS:  1. Cystic fibrosis.  2. Chronic bronchitis  3. Allergic rhinitis.   HISTORY:  This is a late dictation for record completion.  He had last  been seen in August and returned as scheduled.  Medications were  reviewed with particular discussion of Pulmozyme and TOBI.  He is  swimming for exercise.  He had recently seen Dr. Gerilyn Pilgrim who offered a  [name] nasal rinse and advised b.i.d. Nasonex.  Currently he feels well  with little to no routine cough, no phlegm, no chest pain.  He had had  the flu vaccine.  Pneumococcal vaccine first dose was in 2001.   MEDICATIONS:  1. Combivent two puffs t.i.d. p.r.n.  2. Pulmicort four puffs b.i.d.  3. Intal two puffs t.i.d.  4. Guaifenesin/PFC one b.i.d.  5. Pulmozyme by nebulizer once daily.  6. Tobramycin (TOBI) inhaled b.i.d. three weeks on and three weeks      off.  7. Nasonex two sprays b.i.d.  8. Entex PSE b.i.d. p.r.n.  9. Occasional Advil.   ALLERGIES:  No medication allergy.   OBJECTIVE:  VITAL SIGNS:  Weight 189 pounds, BP 113/77.  Pulse regular  at 69.  Room air saturation 95%.  LUNGS:  Mild scattered rhonchi.  HEART:  Regular heart sounds without murmur or gallop.  NECK:  No neck vein distention.  EXTREMITIES:  No cyanosis, clubbing, or edema.   IMPRESSION:  Currently stable with a relatively mild form of cystic  fibrosis and mild asthmatic bronchitis pattern on pulmonary function  testing which had shown response to bronchodilator but near normal  spirometry scores after dilator.   PLAN:  Routine medications were refilled.  PFT and chest x-ray were to  be scheduled.  Blood to be drawn for CBC with differential and chemistry  panel.  Scheduled to return in four months.     Clinton D. Maple Hudson, MD, Tonny Bollman, FACP  Electronically Signed    CDY/MedQ  DD: 01/08/2006  DT: 01/09/2006  Job #: [id number]

## 2010-06-30 NOTE — H&P (Signed)
[facility]  Patient:    Jesus Cummings, Jesus Cummings                   MRN: [id number] Adm. Date:  [id number] Disc. Date: [id number] Attending:  Katha Cabal                         History and Physical  CHIEF COMPLAINT:  Fever postop.  HISTORY OF PRESENT ILLNESS:  Mr. Mentink is a 50 year old gentleman who has had a history of cystic fibrosis.  He underwent laparoscopic appendectomy for acute suppurative appendicitis on Wednesday (today is Sunday).  He has been home about 48 hours and came into the emergency room this morning because he was still running a low-grade fever to 101 and really has not had much appetite, some intermittent mild nausea, and had been having trouble having a bowel movement despite heavy use of laxatives.  He actually feels better now. He has not been dyspneic.  He has not had any severe abdominal pain.  PAST MEDICAL HISTORY:  Noted in his chart and not redictated in this note.  PHYSICAL EXAMINATION:  GENERAL:  The patient is alert, awake, healthy, and in no distress.  VITAL SIGNS:  Blood pressure 131/81, temperature 98, pulse 100.  HEENT:  Head:  Normocephalic.  Eyes:  Non-icteric.  Pupils round and regular.  NECK:  Supple.  No masses or thyromegaly.  LUNGS:  Some decreased breath sounds both bases.  The rest of the lungs sound fairly clear.  ABDOMEN:  Nondistended.  Wounds are clean and healing nicely.  Mild ecchymosis at the umbilicus.  No evidence of wound infection.  The abdomen was soft. There is no localizing tenderness except a little peri-incisional tenderness at both the umbilicus and left lower quadrant incisions.  These are minimal findings.  There is no guarding or rebound.  Bowel sounds are somewhat diminished but present.  LABORATORY DATA:  White count 9900 with a hemoglobin of 13.6.  There is a little bit of a left shift with 80 neutrophils.  Urinalysis is negative with specific gravity of  1015.  Chest x-ray does show what appear to be some bibasilar infiltrates, possibly developing pneumonia or just atelectasis.  The remaining lung fields are unchanged.  His KUB and upright show contrast from his preop appendectomy CT to be in his colon.  His colon is not distended.  He has a couple of slightly dilated loops of small bowel but not markedly so and no evidence to suggest obstruction or severe ileus.  IMPRESSION: 1. Possible developing pneumonia in a patient with a history of cystic    fibrosis, status post appendectomy. 2. Probable persistent mild ileus secondary to the acute appendicitis.  PLAN:  He is going to stay on a liquid diet.  We will put him on Levaquin as an antibiotic, since this has apparently been successful in treating some of his pneumonias before and he will follow up by telephone tomorrow to see how he is doing and I told this patient and his wife that should he have any more significant fevers we would need to reconsider whether he would need to be hospitalized.  At the present time, as he is not dyspneic, in no distress, not febrile, and with a normal white count, I believe can be treated safely as an outpatient. DD:  08/11/00 TD:  08/11/00 Job: [id number]

## 2010-07-05 LAB — RESPIRATORY CULTURE OR RESPIRATORY AND SPUTUM CULTURE

## 2010-07-06 NOTE — Telephone Encounter (Signed)
Please advise if okay to refill. 

## 2010-07-21 ENCOUNTER — Telehealth: Payer: Self-pay | Admitting: Internal Medicine

## 2010-07-21 NOTE — Telephone Encounter (Signed)
Spoke with pt's spouse. She states that pt is needing refill on pulmozyme. I do not see where he has had this before- not on med list from recent ov. She states he has been on this med for years. Wants 30 days supply sent to [facility] in [place].  Pls advise thanks!

## 2010-07-23 MED ORDER — TOBRAMYCIN 300 MG/5ML IN NEBU: 300.0000 mg | INHALATION_SOLUTION | Freq: Two times a day (BID) | RESPIRATORY_TRACT | Status: DC

## 2010-07-23 MED ORDER — DORNASE ALFA 2.5 MG/2.5ML IN SOLN: 2.5000 mg | Freq: Every day | RESPIRATORY_TRACT | Status: DC

## 2010-07-23 NOTE — Telephone Encounter (Signed)
I have added Pulmozyme neb solution back to his med list as "no print". Please send as a script.

## 2010-07-25 ENCOUNTER — Telehealth: Payer: Self-pay | Admitting: Internal Medicine

## 2010-07-25 NOTE — Telephone Encounter (Signed)
Called and spoke with pharmacist at [facility] and was advised that rx was received, but it was sent without CDY's signature, so I gave verbal okay for this. Spoke with Clydie Braun and notified this was done and she verbalized understanding.

## 2010-07-26 MED ORDER — DORNASE ALFA 2.5 MG/2.5ML IN SOLN: 2.5000 mg | Freq: Every day | RESPIRATORY_TRACT | Status: DC

## 2010-07-26 NOTE — Telephone Encounter (Signed)
Addended by: Ronny Bacon on: 07/26/2010 09:45 AM   Modules accepted: Orders

## 2010-07-26 NOTE — Telephone Encounter (Signed)
Rx sent 

## 2010-07-28 ENCOUNTER — Encounter: Payer: Self-pay | Admitting: *Deleted

## 2010-07-28 NOTE — Progress Notes (Signed)
Quick Note:  I have attempted to contact patient several times-home number D/C and no answer at work number(school); will send latter to patient to call the office for his results. ______

## 2010-08-18 ENCOUNTER — Telehealth: Payer: Self-pay | Admitting: Internal Medicine

## 2010-08-18 NOTE — Telephone Encounter (Signed)
LMOMTCB

## 2010-08-21 ENCOUNTER — Telehealth: Payer: Self-pay | Admitting: Internal Medicine

## 2010-08-21 MED ORDER — CIPROFLOXACIN HCL 500 MG PO TABS: ORAL_TABLET | ORAL | Status: DC

## 2010-08-21 NOTE — Telephone Encounter (Signed)
Patient calling back about message left from Friday.

## 2010-08-21 NOTE — Telephone Encounter (Signed)
Called, spoke with pt.  He is aware to take cipro 500 mg 1 bid  And rx sent to [facility] on [address].  He verbalized understanding of this.

## 2010-08-21 NOTE — Telephone Encounter (Signed)
Called, spoke with pt.  He c/o HA, chills, cough with increase in amount of mucus, and chest tightness which resolves with chest percussion.  Mucus pt coughing up is green.  This started x 1 - 1 1/2 wks.  Pt denies increased SOB, wheezing.  Already using toby nebs bid.  Requesting cipro rx.  Allergies verified - nkda.  [employer].  CDY, pls advise. Thanks!

## 2010-08-21 NOTE — Telephone Encounter (Signed)
Please send Cipro 500 mg # 20, 1 twice daily.

## 2010-08-21 NOTE — Telephone Encounter (Signed)
PATIENT CALLED BACK WITH PHONE NUMBER TO REACH HER:  [phone]

## 2010-09-26 ENCOUNTER — Other Ambulatory Visit: Payer: Self-pay | Admitting: Internal Medicine

## 2010-09-29 NOTE — Telephone Encounter (Signed)
Please advise if okay to refill abx for patient

## 2010-10-02 NOTE — Telephone Encounter (Signed)
OK to refill

## 2010-10-12 ENCOUNTER — Telehealth: Payer: Self-pay | Admitting: Internal Medicine

## 2010-10-12 MED ORDER — GEMIFLOXACIN MESYLATE 320 MG PO TABS: ORAL_TABLET | ORAL | Status: DC

## 2010-10-12 NOTE — Telephone Encounter (Signed)
Per CY-okay to give Factive 320mg  #5 take 1 daily x 5 days no refills.

## 2010-10-12 NOTE — Telephone Encounter (Signed)
I spoke with Jesus Cummings and she states pt c/o night/day sweats, cough w/ yellow to white phlem, irritable, has dark circles around the eyes, feels tired. Jesus Cummings is not sure if pt is running a temp. Jesus Cummings states pt denies any wheezing/chest tightness. Pt is currently on Levaquin but Jesus Cummings states this seems not to be helping. Pt also taking mucinex d 1 q 12 hours. Jesus Cummings is requesting further recs. Please advise Dr. Maple Hudson. Thanks  No Known Allergies  Carver Fila, CMA

## 2010-10-12 NOTE — Telephone Encounter (Signed)
LM on named vm advise factive rx has been sent and to call back if pt is no better

## 2010-10-13 ENCOUNTER — Telehealth: Payer: Self-pay | Admitting: Internal Medicine

## 2010-10-13 MED ORDER — CIPROFLOXACIN HCL 500 MG PO TABS: 500.0000 mg | ORAL_TABLET | Freq: Two times a day (BID) | ORAL | Status: AC

## 2010-10-13 NOTE — Telephone Encounter (Signed)
LMOM informing Clydie Braun of the change from [place] to Cipro.  Rx sent to pharmacy.

## 2010-10-13 NOTE — Telephone Encounter (Signed)
Called and spoke with pt's wife, Jesus Cummings.  Jesus Cummings states CY called in rx yesterday for Factive.  However, pharmacy does not have this in stock- will probably not get it until next week.  Jesus Cummings is wanting to know if pt can try Cipro instead.  CY please advise.  Thanks.  No Known Allergies

## 2010-10-13 NOTE — Telephone Encounter (Signed)
Per CDY, ok to change script for factive to cipro 500 mg bid # 20

## 2010-11-20 ENCOUNTER — Telehealth: Payer: Self-pay | Admitting: Internal Medicine

## 2010-11-20 DIAGNOSIS — J42 Unspecified chronic bronchitis: Secondary | ICD-10-CM

## 2010-11-20 NOTE — Telephone Encounter (Signed)
I spoke with pt wife and she states pt nebulizer broke and needs a new order sent to [facility]. Wife also states she would like to talk to Dr. Maple Hudson personally regarding pt's chronic illness w/o the patient being around. Pt wife states if Dr. Maple Hudson can call her that would be fine. Please advise Dr. Maple Hudson, thanks  Carver Fila, CMA

## 2010-11-21 MED ORDER — TOBRAMYCIN 300 MG/5ML IN NEBU: 300.0000 mg | INHALATION_SOLUTION | Freq: Two times a day (BID) | RESPIRATORY_TRACT | Status: DC

## 2010-11-21 MED ORDER — DORNASE ALFA 2.5 MG/2.5ML IN SOLN: 2.5000 mg | Freq: Every day | RESPIRATORY_TRACT | Status: DC

## 2010-11-21 NOTE — Telephone Encounter (Signed)
Ok to order replacement nebulizer for dx bronchitis- ok to refill nebulizer med prn I tried to return wife's call- LMOM.

## 2010-11-21 NOTE — Telephone Encounter (Signed)
Pt's wife returning call & can be reached at [phone].  Antionette Fairy

## 2010-11-21 NOTE — Telephone Encounter (Signed)
I tried again at the latest number and got the same answering machine message that she was out of the office.

## 2010-11-21 NOTE — Telephone Encounter (Signed)
Wife is aware of order for neb machine and neb medicines; CY to call after office today.

## 2010-11-24 ENCOUNTER — Other Ambulatory Visit: Payer: Self-pay | Admitting: Internal Medicine

## 2010-11-24 ENCOUNTER — Telehealth: Payer: Self-pay | Admitting: Internal Medicine

## 2010-11-24 MED ORDER — IPRATROPIUM-ALBUTEROL 18-103 MCG/ACT IN AERO: 2.0000 | INHALATION_SPRAY | Freq: Three times a day (TID) | RESPIRATORY_TRACT | Status: DC

## 2010-11-24 NOTE — Telephone Encounter (Signed)
Already done

## 2010-11-24 NOTE — Telephone Encounter (Signed)
Rx for combivent was refilled. ATC pt and inform, NA and no option to leave a msg.

## 2010-11-27 NOTE — Telephone Encounter (Signed)
ATC NA and could not leave VM [facility]

## 2010-11-28 NOTE — Telephone Encounter (Signed)
Atc number provided above but received a message stating this number has been temporarily disconnected.  wcb.

## 2010-11-29 NOTE — Telephone Encounter (Signed)
LMOMTCB x 1. There is a 2nd msg for this pt and the callback number was [phone], Romilda Joy. The other msg deals with the pt's nebulizer machine.

## 2010-11-29 NOTE — Telephone Encounter (Addendum)
LMOMTCB x 1 There is a 2nd msg on this pt regarding a refill.

## 2010-11-29 NOTE — Telephone Encounter (Signed)
Ok to replace nebulizer machine through [facility] for dx chronic bronchitis.   I tried to return wife's call. Is there a time when we can return her call, if she still wants, outside of patient visit scheduled times?

## 2010-11-30 NOTE — Telephone Encounter (Signed)
Per CY-okay to put pt on Monday in 1130am slot.

## 2010-11-30 NOTE — Telephone Encounter (Signed)
Spoke to pt's wife and she states [facility] was going to deliver a neb machine and she was expecting Chartered loss adjuster to do this, i told pt i could change the order for her, she states if [facility] is ok with dr young they are fine with this i told whatever was good for them was ok with dr young so she is going to call them and have this redelivered. Wife states she would like appt for pt either Friday pm or on monday

## 2010-11-30 NOTE — Telephone Encounter (Signed)
lmomtcb  

## 2010-12-01 NOTE — Telephone Encounter (Signed)
Pt's wife called back. Pt scheduled to see CY on Monday at 11: 30 am

## 2010-12-03 ENCOUNTER — Other Ambulatory Visit: Payer: Self-pay | Admitting: Internal Medicine

## 2010-12-04 ENCOUNTER — Ambulatory Visit (INDEPENDENT_AMBULATORY_CARE_PROVIDER_SITE_OTHER)
Admission: RE | Admit: 2010-12-04 | Discharge: 2010-12-04 | Disposition: A | Discharge status: Home / Self Care | Payer: BC Managed Care – PPO | Source: Ambulatory Visit | Attending: Internal Medicine | Admitting: Internal Medicine

## 2010-12-04 ENCOUNTER — Ambulatory Visit (INDEPENDENT_AMBULATORY_CARE_PROVIDER_SITE_OTHER): Payer: BC Managed Care – PPO | Admitting: Internal Medicine

## 2010-12-04 ENCOUNTER — Encounter: Payer: Self-pay | Admitting: Internal Medicine

## 2010-12-04 VITALS — BP 108/60 | HR 83 | Ht 69.0 in | Wt 189.6 lb

## 2010-12-04 DIAGNOSIS — J40 Bronchitis, not specified as acute or chronic: Secondary | ICD-10-CM

## 2010-12-04 DIAGNOSIS — J42 Unspecified chronic bronchitis: Secondary | ICD-10-CM

## 2010-12-04 MED ORDER — GEMIFLOXACIN MESYLATE 320 MG PO TABS: 320.0000 mg | ORAL_TABLET | Freq: Every day | ORAL | Status: AC

## 2010-12-04 MED ORDER — FLUTICASONE PROPIONATE 50 MCG/ACT NA SUSP: 2.0000 | Freq: Every day | NASAL | Status: DC

## 2010-12-04 MED ORDER — PEAK FLOW METER DEVI: Status: DC

## 2010-12-04 MED ORDER — SINUS RINSE REFILL NA PACK: PACK | NASAL | Status: AC

## 2010-12-04 MED ORDER — IPRATROPIUM-ALBUTEROL 18-103 MCG/ACT IN AERO: 2.0000 | INHALATION_SPRAY | Freq: Three times a day (TID) | RESPIRATORY_TRACT | Status: DC

## 2010-12-04 MED ORDER — ROFLUMILAST 500 MCG PO TABS: 500.0000 ug | ORAL_TABLET | Freq: Every day | ORAL | Status: DC

## 2010-12-04 MED ORDER — PSEUDOEPHEDRINE-GUAIFENESIN ER 120-1200 MG PO TB12: 1.0000 | ORAL_TABLET | Freq: Two times a day (BID) | ORAL | Status: DC

## 2010-12-04 MED ORDER — BUDESONIDE 180 MCG/ACT IN AEPB: 4.0000 | INHALATION_SPRAY | Freq: Two times a day (BID) | RESPIRATORY_TRACT | Status: DC

## 2010-12-04 NOTE — Progress Notes (Signed)
Patient ID: Jesus Cummings, male    DOB: 1960-04-30, 50 y.o.   MRN: [id number]  HPI 06/26/10- 86 yoM never smoker followed for chronic sinusitis and a CF-variant with chronic bronchitis, complicated by hx pulmonary hypertension.  Last here January 10, 2010. He has been getting his aerosol Tobi recently through a face mask to deliver it to nose and lower airway. He wants to try that for his pulmozyme as well. We discussed salinity of aerosols and airway irritability.  Swimming keeps his airways open, otherwise cough gets productive. He does use Flutter and VEST for pulmonary toilet. Denies change in exercise tolerance over time. Cipro has been his best fall-back drug. We talked about newer drugs. Worst time seems to be Fall and Spring. Most likely it is virus infections rather than seasonal allergy at those times.  12/03/10- 49 yoM never smoker followed for chronic sinusitis and a CF-variant with chronic bronchitis, complicated by hx pulmonary hypertension. ...wife here For at least the last month he reports wheezing too much which he calls asthma. Feels a coating in his airways, but cough is dry. Scant mucus from nose and chest. We had called in Levaquin but he says that never works well but did help some as he finishes his second week. Nasal discharge is still yellow with nose blowing and postnasal drip. We discussed his previous experience with several antibiotics and he remembers [place] working well. 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 Because he was not particularly symptomatic, we chose to wait on treatment in hopes other flora would take over. He is still swimming but feels hot and cold with muscle aches after swimming. Wife has tried to take his temperature and suspects low grade fevers. He is taking more naps. He feels his chest stays clear only about a week after each antibiotic. 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   Review of Systems- see HPI Constitutional:   No-   weight loss, night sweats,  +fevers, chills, fatigue, lassitude. HEENT:   No-  headaches, difficulty swallowing, tooth/dental problems, sore throat,       No-  sneezing, itching, ear ache,  +nasal congestion, post nasal drip,  CV:  No-   chest pain, orthopnea, PND, swelling in lower extremities, anasarca, dizziness, palpitations Resp: No-   shortness of breath with exertion or at rest.              No-   productive cough,  No non-productive cough,  No- coughing up of blood.              + change in color of mucus.  No- wheezing.   Skin: No-   rash or lesions. GI:  No-   heartburn, indigestion, abdominal pain, nausea, vomiting, diarrhea,                 change in bowel habits, loss of appetite GU: No-   dysuria, change in color of urine, no urgency or frequency.  No- flank pain. MS:  No-   joint pain or swelling.  No- decreased range of motion.  No- back pain. Neuro-     nothing unusual Psych:  No- change in mood or affect. No depression or anxiety.  No memory loss.      Objective:   Physical Exam General- Alert, Oriented, Affect-appropriate, Distress- none acute Skin- rash-none, lesions- none, excoriation- none Lymphadenopathy- none Head-  atraumatic            Eyes- Gross vision intact, PERRLA, conjunctivae clear secretions            Ears- Hearing, canals-normal            Nose- Clear, no-Septal dev, mucus, polyps, erosion, perforation             Throat- Mallampati II , mucosa clear , drainage- none, tonsils- atrophic Neck- flexible , trachea midline, no stridor , thyroid nl, carotid no bruit Chest - symmetrical excursion , unlabored           Heart/CV- RRR , no murmur , no gallop  , no rub, nl s1 s2                           - JVD- none , edema- none, stasis changes- none, varices- none           Lung- raspy breath sounds, wheeze- none, cough- none , dullness-none, rub- none. Unlabored           Chest  wall-  Abd- tender-no, distended-no, bowel sounds-present, HSM- no Br/ Gen/ Rectal- Not done, not indicated Extrem- cyanosis- none, clubbing, none, atrophy- none, strength- nl Neuro- grossly intact to observation

## 2010-12-04 NOTE — Patient Instructions (Addendum)
Order- Peak Flow meter             PPD skin test             Flu vax -(pt declined at this time; will come back later)              CXR- dx bronchitis              Sputum C&S, gram stain     Routine and AFB w/ smear  Scripts for Daliresp and for Factive antibiotic

## 2010-12-05 ENCOUNTER — Telehealth: Payer: Self-pay | Admitting: Internal Medicine

## 2010-12-05 MED ORDER — FLUTICASONE PROPIONATE 50 MCG/ACT NA SUSP: 2.0000 | Freq: Two times a day (BID) | NASAL | Status: DC

## 2010-12-05 NOTE — Telephone Encounter (Signed)
Returning call.

## 2010-12-05 NOTE — Telephone Encounter (Signed)
I don't see any plan for labs other than sputum culture in my notes either. Did she remember anything specific.

## 2010-12-05 NOTE — Telephone Encounter (Signed)
Pt's spouse says the Aleda Grana was never sent to [facility] [place]. I had to apologize because the RX had been sent to [facility] in [place] by mistake. This has been corrected. The spouse also says the pt was to have labs drawn and the only thing they gave the pt was a sputum cup. Pls advise if any labs should have been drawn for this patient.

## 2010-12-05 NOTE — Telephone Encounter (Signed)
lmomtcb for Jesus Cummings. Shows rx was sent yesterday

## 2010-12-05 NOTE — Assessment & Plan Note (Signed)
We associated his current symptoms with on going low grade chronic bronchitis as an exacerbation of his CF. Plan-PPD skin test, flu vaccine, peak flow meter, sputum for culture and sensitivity, prescription Daliresp with discussion, prescription Factive

## 2010-12-05 NOTE — Telephone Encounter (Signed)
I spoke with Jesus Cummings and made her aware of cy response. She states she thought Dr. Maple Hudson had mentioned doing some labs but since he states he didn't see any plan for labs in his note then not to worry about. Nothing further was needed

## 2010-12-06 ENCOUNTER — Other Ambulatory Visit: Payer: BC Managed Care – PPO

## 2010-12-06 ENCOUNTER — Telehealth: Payer: Self-pay | Admitting: *Deleted

## 2010-12-06 DIAGNOSIS — J40 Bronchitis, not specified as acute or chronic: Secondary | ICD-10-CM

## 2010-12-06 LAB — TB SKIN TEST: Induration: 0

## 2010-12-06 NOTE — Telephone Encounter (Signed)
Pt walked in this afternoon to have TB skin test read, which was neg.  Pt states per his job, hey require him to have a second PPD placed and read.  Pt would like to have this done here on Monday.  Cy, please advise if ok to do a 2nd ppd on pt.  Thanks.

## 2010-12-06 NOTE — Telephone Encounter (Signed)
Yes- ok to repeat PPD - we want to make sure our charting shows he had it twice.

## 2010-12-07 NOTE — Progress Notes (Signed)
Quick Note:  Pt is aware of results and will have PPD repeated per work request on Monday 12-11-10 ______

## 2010-12-07 NOTE — Telephone Encounter (Signed)
Called and spoke with pt's wife and informed her of CY's response.  She will notify pt of appt date and time for ppd placement.

## 2010-12-07 NOTE — Telephone Encounter (Signed)
Please call patient and have him come back in on Monday at 845am to have PPD placed-we can add patient on to my schedule then. Thanks.

## 2010-12-07 NOTE — Telephone Encounter (Signed)
Carma Lair ok'd for pt to have PPD placed again.  I am forwarding this message to you so you can come up with a date and time with pt for him to come in and have this placed by you.

## 2010-12-11 ENCOUNTER — Ambulatory Visit (INDEPENDENT_AMBULATORY_CARE_PROVIDER_SITE_OTHER): Payer: BC Managed Care – PPO | Admitting: Internal Medicine

## 2010-12-11 ENCOUNTER — Telehealth: Payer: Self-pay | Admitting: Internal Medicine

## 2010-12-11 DIAGNOSIS — J42 Unspecified chronic bronchitis: Secondary | ICD-10-CM

## 2010-12-11 LAB — RESPIRATORY CULTURE OR RESPIRATORY AND SPUTUM CULTURE: Gram Stain: NONE SEEN

## 2010-12-11 NOTE — Telephone Encounter (Signed)
I called and spoke with the pharmacy-they are aware of the change in qty for Factive RX. I attempted to call the number given to let contact person know this has been taken care of-unable to get an answer and no voicemail option. Will attempt to call later.

## 2010-12-11 NOTE — Telephone Encounter (Signed)
Per CY-okay to give #7 of Factive.

## 2010-12-11 NOTE — Telephone Encounter (Signed)
On 10/22 dr young ordered factive #10pills for pt but package comes as 5 or 7 pills not ten pls advise which one to change pt to or would he like 2 packages of the 5.

## 2010-12-11 NOTE — Assessment & Plan Note (Signed)
Second PPD again negative, needed documentation for work.

## 2010-12-11 NOTE — Progress Notes (Signed)
Here for nurse to read PPD- negative

## 2010-12-11 NOTE — Telephone Encounter (Signed)
ATC pt Jesus Cummings, and no option to leave a msg, WCB.

## 2010-12-11 NOTE — Progress Notes (Signed)
Quick Note:  Pt is aware of results. ______ 

## 2010-12-12 NOTE — Telephone Encounter (Signed)
ATCx2 to advise Clydie Braun that rx was corrected. NA, no voicemail. Carron Curie, CMA

## 2010-12-13 NOTE — Telephone Encounter (Signed)
ATC NA unable to leav vm wcb

## 2010-12-14 ENCOUNTER — Encounter: Payer: Self-pay | Admitting: *Deleted

## 2010-12-14 NOTE — Telephone Encounter (Signed)
Spoke w/ Clydie Braun & she confirmed that pt rec'd his medication & can be reached today at [phone] or tomorrow at  [phone].  Antionette Fairy

## 2010-12-14 NOTE — Telephone Encounter (Signed)
This was changed already

## 2010-12-14 NOTE — Telephone Encounter (Signed)
Left a msg on mobile number listed for the pt. Asked that someone return triage's call to make sure pt received his medication.

## 2010-12-14 NOTE — Telephone Encounter (Signed)
Called and spoke with karen.  Clydie Braun stated pt received his full rx that was needed.  Nothing further needed.

## 2010-12-15 ENCOUNTER — Telehealth: Payer: Self-pay | Admitting: Internal Medicine

## 2010-12-15 MED ORDER — CIPROFLOXACIN HCL 500 MG PO TABS: 500.0000 mg | ORAL_TABLET | Freq: Two times a day (BID) | ORAL | Status: DC

## 2010-12-15 NOTE — Telephone Encounter (Signed)
OK to send Cipro 500, # 20, 1 twice daily

## 2010-12-15 NOTE — Telephone Encounter (Signed)
Pt wife is aware of cdy recs and rx has been called into pharmacy. Wife needed nothing further and rx has been sent for cipro 500mg 

## 2010-12-15 NOTE — Telephone Encounter (Signed)
Called, spoke with Clydie Braun.  States pt is still coughing with mostly yellowish brown mucus but did start coughing up green yesterday.  Also, does have chest tightness, increased SOB when swimming, and sweats hs.  Wheezing has improved.  Still taking the Factive that was given for 10 days but, per Clydie Braun, pt has been fighting this for a long time and is tired of fighting it -- Requesting rx for cipro.  nkda - verified.  [employer].  Dr. Maple Hudson, pls advise.  Thanks!

## 2010-12-21 ENCOUNTER — Telehealth: Payer: Self-pay | Admitting: Internal Medicine

## 2010-12-21 NOTE — Telephone Encounter (Signed)
Pt calling for sputum results. Please advise. Thanks.

## 2010-12-22 NOTE — Telephone Encounter (Signed)
ATC all numbers on file for patient and Clydie Braun; unable to reach anyone-Karen states she is out of the office today. Will need to try again later to reach patient to inform him of results and place order to [facility] Infectious Disease.

## 2010-12-22 NOTE — Telephone Encounter (Signed)
I tried to reach him last night but got no answer.  His sputum culture is again growing Alcaligines, a bacterium resistant to all of our oral meds. We probably will need to treat with outpatient IV therapy.  He will be best served by -     Order referral to [facility] for dx cystic fibrosis, chronic bronchitis- Dr Ninetta Lights, Frazier Richards etc.

## 2010-12-22 NOTE — Telephone Encounter (Signed)
Spoke with Clydie Braun and notified of results/recs per CDY. She verbalized understanding and order was sent to [facility] for ID ref.

## 2010-12-26 ENCOUNTER — Other Ambulatory Visit: Payer: Self-pay | Admitting: Internal Medicine

## 2010-12-26 ENCOUNTER — Ambulatory Visit (INDEPENDENT_AMBULATORY_CARE_PROVIDER_SITE_OTHER): Payer: BC Managed Care – PPO | Admitting: Internal Medicine

## 2010-12-26 ENCOUNTER — Encounter: Payer: Self-pay | Admitting: Internal Medicine

## 2010-12-26 VITALS — BP 125/79 | HR 79 | Temp 97.4°F | Ht 69.0 in | Wt 187.0 lb

## 2010-12-26 DIAGNOSIS — Z23 Encounter for immunization: Secondary | ICD-10-CM

## 2010-12-26 DIAGNOSIS — J42 Unspecified chronic bronchitis: Secondary | ICD-10-CM

## 2010-12-26 NOTE — Assessment & Plan Note (Signed)
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 I did discuss with patient that it's hard to tell if this is colonizer or causing significant symptoms. However certainly with his history of the ciliary dysfunction that treatment for this resistant organism may give some benefit. He is followed closely by Dr. Maple Hudson and I will have him started on imipenem 3 times a day by ID and have a PICC line placed as soon as possible to. I did express to him I hope that this does help him symptomatically and that it persists after antibiotic therapy. We'll have him followup in about 2-3 weeks to see if he is improving. I have put him on a 21 day course at this time and he will have followup with pulmonary as well.

## 2010-12-26 NOTE — Progress Notes (Signed)
  Subjective:    Patient ID: Jesus Cummings, male    DOB: 1960-06-03, 50 y.o.   MRN: [id number]  HPI this is a new patient who comes in to the infectious disease clinic for a history of chronic pulmonary symptoms. 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 He has had this problem for many years and most recently has had more and more difficulty with cough and with sputum production. He's had multiple rounds of oral antibiotics in the past however most recently has had little relief with oral antibiotics. A recent sputum culture has grown out Alcaligenes which is persistent from a previous culture in the past. Unfortunately the resistance pattern is such that it is only sensitive to Zosyn and imipenem. He otherwise denies any recent fever or chills. He does describe some change in sputum color to yellow or brownish. He also does relate some fatigue and vague malaise. He otherwise has been in good spirits and denies any recent weight loss.    Review of Systems  Constitutional: Positive for activity change. Negative for fever, chills and appetite change.  HENT: Positive for congestion.   Respiratory: Positive for cough. Negative for shortness of breath.   Cardiovascular: Negative for chest pain.  Gastrointestinal: Negative for diarrhea and constipation.  Musculoskeletal: Negative for myalgias and arthralgias.  Skin: Negative for pallor and rash.  Neurological: Negative for headaches.       Objective:   Physical Exam  Constitutional: He appears well-developed and well-nourished. No distress.  HENT:  Mouth/Throat: Oropharynx is clear and moist. No oropharyngeal exudate.  Eyes: No scleral icterus.  Cardiovascular: Normal rate, regular rhythm and normal heart sounds.   No murmur heard. Pulmonary/Chest: Effort normal. He has rales.  Abdominal: Soft. Bowel sounds are normal. There is no tenderness.  Lymphadenopathy:    He has no cervical  adenopathy.  Skin: Skin is warm and dry. No erythema.          Assessment & Plan:

## 2010-12-27 ENCOUNTER — Ambulatory Visit (HOSPITAL_COMMUNITY)
Admission: RE | Admit: 2010-12-27 | Discharge: 2010-12-27 | Disposition: A | Discharge status: Home / Self Care | Payer: BC Managed Care – PPO | Source: Ambulatory Visit | Attending: Internal Medicine | Admitting: Internal Medicine

## 2010-12-27 ENCOUNTER — Encounter (HOSPITAL_COMMUNITY): Payer: Self-pay

## 2010-12-27 ENCOUNTER — Encounter (HOSPITAL_COMMUNITY)
Admission: RE | Admit: 2010-12-27 | Discharge: 2010-12-27 | Disposition: A | Discharge status: Home / Self Care | Payer: BC Managed Care – PPO | Source: Ambulatory Visit | Attending: Internal Medicine | Admitting: Internal Medicine

## 2010-12-27 ENCOUNTER — Ambulatory Visit: Payer: BC Managed Care – PPO | Admitting: Internal Medicine

## 2010-12-27 ENCOUNTER — Telehealth: Payer: Self-pay | Admitting: *Deleted

## 2010-12-27 ENCOUNTER — Other Ambulatory Visit: Payer: Self-pay | Admitting: Internal Medicine

## 2010-12-27 DIAGNOSIS — J42 Unspecified chronic bronchitis: Secondary | ICD-10-CM

## 2010-12-27 HISTORY — DX: Shortness of breath: R06.02

## 2010-12-27 MED ORDER — SODIUM CHLORIDE 0.9 % IV SOLN
1000.0000 mg | INTRAVENOUS | Status: AC
  Administered 2010-12-27: 1000 mg via INTRAVENOUS
  Filled 2010-12-27: qty 1000

## 2010-12-27 MED ORDER — SODIUM CHLORIDE 0.9 % IJ SOLN: 10.0000 mL | Freq: Two times a day (BID) | INTRAMUSCULAR | Status: DC

## 2010-12-27 MED ORDER — IMIPENEM-CILASTATIN 500 MG IM SOLR: 1000.0000 mg | Freq: Three times a day (TID) | INTRAMUSCULAR | Status: DC

## 2010-12-27 MED ORDER — HEPARIN SOD (PORK) LOCK FLUSH 100 UNIT/ML IV SOLN
250.0000 [IU] | INTRAVENOUS | Status: DC
  Filled 2010-12-27: qty 3

## 2010-12-27 MED ORDER — SODIUM CHLORIDE 0.9 % IJ SOLN: 10.0000 mL | INTRAMUSCULAR | Status: DC | PRN

## 2010-12-27 MED ORDER — SODIUM CHLORIDE 0.9 % IV SOLN: INTRAVENOUS | Status: AC

## 2010-12-27 NOTE — Progress Notes (Signed)
Addended by: Wendall Mola A on: 12/27/2010 10:49 AM   Modules accepted: Orders

## 2010-12-27 NOTE — Telephone Encounter (Signed)
The pharmacist from [facility] called asking for clarification on the Primaxin. Not in stock, how many days will he be on it? And is the amount ordered-6300 correct? Her # is [phone]

## 2010-12-27 NOTE — Procedures (Signed)
43cm SL PICC R arm to svc/RA jct under [place] and fluoro No compl Ok to use

## 2010-12-27 NOTE — Progress Notes (Signed)
Addended by: Wendall Mola A on: 12/27/2010 12:09 PM   Modules accepted: Orders

## 2010-12-27 NOTE — Telephone Encounter (Signed)
I checked with the staff working with Dr. Luciana Axe. Thi s med was sent in error to the pharmacy. Pt to get it in short stay at hospital

## 2010-12-28 ENCOUNTER — Telehealth: Payer: Self-pay | Admitting: Internal Medicine

## 2010-12-28 NOTE — Telephone Encounter (Signed)
LMTCB

## 2010-12-28 NOTE — Telephone Encounter (Signed)
Spoke with Clydie Braun and notified of recs per CDY. She verbalized understanding.

## 2010-12-28 NOTE — Telephone Encounter (Signed)
Called and spoke wit pt's wife, Jesus Cummings.  Jesus Cummings states pt had PICC line placed yesterday.  Jesus Cummings wanted to know if they can still do "percussion" on pt and how hard.  States pt has been c/o his "lung pinching and seizing up on him" and really needs wife to percussion them.  CY, please advise if this is ok or not.  Thanks.

## 2010-12-28 NOTE — Telephone Encounter (Signed)
Per CY-their usual percussion should be fine.

## 2011-01-18 ENCOUNTER — Encounter: Payer: Self-pay | Admitting: Internal Medicine

## 2011-01-18 ENCOUNTER — Ambulatory Visit (INDEPENDENT_AMBULATORY_CARE_PROVIDER_SITE_OTHER): Payer: BC Managed Care – PPO | Admitting: Internal Medicine

## 2011-01-18 NOTE — Assessment & Plan Note (Signed)
He has about completed 21 days and overall feels better.  I spoke with him and his wife at length the course and hope that he remains relatively symptom free for an extended period.  I discussed the difficulty with multi drug resistant organisms.  All questions were answered.     He can follow up on an as needed basis.

## 2011-01-18 NOTE — Progress Notes (Signed)
  Subjective:    Patient ID: Jesus Cummings, male    DOB: 05/04/1960, 50 y.o.   MRN: [id number]  HPI he returns for follow up after almost completing his 21 day course of IV antibiotics.  He reports improvement of his symptoms and no fever, chills or new symptoms.  No rash or diarrhea.  He is anxious to return to swimming.     Review of Systems  Constitutional: Negative for fever, chills, appetite change, fatigue and unexpected weight change.  Respiratory: Positive for cough. Negative for shortness of breath.        Stable cough, improved  Cardiovascular: Negative for chest pain.  Gastrointestinal: Negative for nausea and diarrhea.  Musculoskeletal: Negative for myalgias and arthralgias.  Skin: Negative for rash.       Objective:   Physical Exam  Constitutional: He appears well-developed and well-nourished. No distress.  Cardiovascular: Normal rate, regular rhythm and normal heart sounds.   No murmur heard. Pulmonary/Chest: Effort normal and breath sounds normal. No respiratory distress. He has no wheezes.  Abdominal: Soft. Bowel sounds are normal. He exhibits no distension. There is no tenderness.  Skin: Skin is warm and dry. No erythema.  Psychiatric: He has a normal mood and affect. His behavior is normal.          Assessment & Plan:

## 2011-01-19 ENCOUNTER — Encounter: Payer: Self-pay | Admitting: Internal Medicine

## 2011-01-22 ENCOUNTER — Other Ambulatory Visit (INDEPENDENT_AMBULATORY_CARE_PROVIDER_SITE_OTHER): Payer: BC Managed Care – PPO

## 2011-01-22 ENCOUNTER — Encounter: Payer: Self-pay | Admitting: Internal Medicine

## 2011-01-22 ENCOUNTER — Ambulatory Visit (INDEPENDENT_AMBULATORY_CARE_PROVIDER_SITE_OTHER)
Admission: RE | Admit: 2011-01-22 | Discharge: 2011-01-22 | Disposition: A | Discharge status: Home / Self Care | Payer: BC Managed Care – PPO | Source: Ambulatory Visit | Attending: Internal Medicine | Admitting: Internal Medicine

## 2011-01-22 ENCOUNTER — Ambulatory Visit: Payer: BC Managed Care – PPO | Admitting: Internal Medicine

## 2011-01-22 ENCOUNTER — Ambulatory Visit (INDEPENDENT_AMBULATORY_CARE_PROVIDER_SITE_OTHER): Payer: BC Managed Care – PPO | Admitting: Internal Medicine

## 2011-01-22 VITALS — BP 118/80 | HR 84 | Ht 69.0 in | Wt 194.8 lb

## 2011-01-22 DIAGNOSIS — J40 Bronchitis, not specified as acute or chronic: Secondary | ICD-10-CM

## 2011-01-22 DIAGNOSIS — J42 Unspecified chronic bronchitis: Secondary | ICD-10-CM

## 2011-01-22 LAB — CBC WITH DIFFERENTIAL/PLATELET
Basophils Relative: 0.4 % (ref 0.0–3.0)
Eosinophils Absolute: 0.2 10*3/uL (ref 0.0–0.7)
Hemoglobin: 14.3 g/dL (ref 13.0–17.0)
MCHC: 34.7 g/dL (ref 30.0–36.0)
MCV: 90.2 fl (ref 78.0–100.0)
Monocytes Absolute: 0.5 10*3/uL (ref 0.1–1.0)
Neutro Abs: 5.1 10*3/uL (ref 1.4–7.7)
RBC: 4.56 Mil/uL (ref 4.22–5.81)

## 2011-01-22 NOTE — Progress Notes (Signed)
Patient ID: Jesus Cummings, male    DOB: 1961/02/10, 50 y.o.   MRN: [id number]  HPI 06/26/10- 17 yoM never smoker followed for chronic sinusitis and a CF-variant with chronic bronchitis, complicated by hx pulmonary hypertension.  Last here January 10, 2010. He has been getting his aerosol Tobi recently through a face mask to deliver it to nose and lower airway. He wants to try that for his pulmozyme as well. We discussed salinity of aerosols and airway irritability.  Swimming keeps his airways open, otherwise cough gets productive. He does use Flutter and VEST for pulmonary toilet. Denies change in exercise tolerance over time. Cipro has been his best fall-back drug. We talked about newer drugs. Worst time seems to be Fall and Spring. Most likely it is virus infections rather than seasonal allergy at those times.  12/03/10- 49 yoM never smoker followed for chronic sinusitis and a CF-variant with chronic bronchitis, complicated by hx pulmonary hypertension. ...wife here For at least the last month he reports wheezing too much which he calls asthma. Feels a coating in his airways, but cough is dry. Scant mucus from nose and chest. We had called in Levaquin but he says that never works well but did help some as he finishes his second week. Nasal discharge is still yellow with nose blowing and postnasal drip. We discussed his previous experience with several antibiotics and he remembers [place] working well. 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 Because he was not particularly symptomatic, we chose to wait on treatment in hopes other flora would take over. He is still swimming but feels hot and cold with muscle aches after swimming. Wife has tried to take his temperature and suspects low grade fevers. He is taking more naps. He feels his chest stays clear only about a week after each antibiotic. 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  01/22/11- 49 yoM never smoker followed for chronic sinusitis and a CF-variant with chronic bronchitis, complicated by hx pulmonary hypertension. ...wife here Had flu vaccine. Has been working with Dr Comer/ ID. 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 AFB smear and culture were negative.  This weekend his chest felt tight her with deep breath and he was more dyspneic. No fever or sweat. Today he feels much improved and went for a good walk yesterday although he still doesn't feel well enough to swimming. Chest x-ray 12/04/2010 showed scarring with no active process. He describes getting extremely smothered trying to lie supine long enough to have the PICC line placed. This is unusual for him. His feet have not been swelling. He has no residual chest discomfort other than his chronic cough. He has been using a Estate agent, Flutter, and wife does chest PT. we discussed using an incentive spirometer to help him assess how deep of breath he is taking.  Review of Systems- see HPI Constitutional:   No-   weight loss, night sweats,  +fevers, chills, fatigue, lassitude. HEENT:   No-  headaches, difficulty swallowing, tooth/dental problems, sore throat,       No-  sneezing, itching, ear ache,  +nasal congestion, post nasal drip,  CV:  No-   chest pain, orthopnea, PND, swelling in lower extremities, anasarca, dizziness, palpitations Resp: +  shortness of breath with exertion or at rest.  Less   productive cough,  No non-productive cough,  No- coughing up of blood.              + change in color of mucus.  No- wheezing.   Skin: No-   rash or lesions. GI:  No-   heartburn, indigestion, abdominal pain, nausea, vomiting, diarrhea,                 change in bowel habits, loss of appetite GU: No-   dysuria, change in color of urine, no urgency or frequency.  No- flank pain. MS:  No-   joint pain or  swelling.  No- decreased range of motion.  No- back pain. Neuro-     nothing unusual Psych:  No- change in mood or affect. No depression or anxiety.  No memory loss.      Objective:   Physical Exam General- Alert, Oriented, Affect-appropriate, Distress- none acute Skin- rash-none, lesions- none, excoriation- none Lymphadenopathy- none Head- atraumatic            Eyes- Gross vision intact, PERRLA, conjunctivae clear secretions            Ears- Hearing, canals-normal            Nose- Clear, no-Septal dev, mucus, polyps, erosion, perforation             Throat- Mallampati II , mucosa clear , drainage- none, tonsils- atrophic Neck- flexible , trachea midline, no stridor , thyroid nl, carotid no bruit Chest - symmetrical excursion , unlabored           Heart/CV- RRR , no murmur , no gallop  , no rub, nl s1 s2                           - JVD- none , edema- none, stasis changes- none, varices- none           Lung- trace crackles right base, wheeze- none, cough- none , dullness-none, rub- none. Unlabored           Chest wall-  Abd- tender-no, distended-no, bowel sounds-present, HSM- no Br/ Gen/ Rectal- Not done, not indicated Extrem- cyanosis- none, clubbing, none, atrophy- none, strength- nl Neuro- grossly intact to observation

## 2011-01-22 NOTE — Patient Instructions (Signed)
Order- CXR- chronic bronchitis              CBC w/ diff  If labs look ok we can have [facility] pull your Picc line  Script for Incentive spirometer

## 2011-01-24 ENCOUNTER — Other Ambulatory Visit: Payer: BC Managed Care – PPO

## 2011-01-24 ENCOUNTER — Other Ambulatory Visit: Payer: Self-pay | Admitting: Internal Medicine

## 2011-01-24 DIAGNOSIS — J42 Unspecified chronic bronchitis: Secondary | ICD-10-CM

## 2011-01-25 ENCOUNTER — Telehealth: Payer: Self-pay | Admitting: Internal Medicine

## 2011-01-25 NOTE — Assessment & Plan Note (Signed)
The home health nurse from [facility] can remove his PICC line.

## 2011-01-26 NOTE — Telephone Encounter (Signed)
I spoke with Clydie Braun about this; she is faxing the paperwork over that he needs help with as far as TB skin test dates and such.

## 2011-01-29 ENCOUNTER — Telehealth: Payer: Self-pay | Admitting: Internal Medicine

## 2011-01-29 LAB — RESPIRATORY CULTURE OR RESPIRATORY AND SPUTUM CULTURE: Gram Stain: NONE SEEN

## 2011-01-29 NOTE — Telephone Encounter (Signed)
Duplicate, see prev phone note. Carron Curie, CMA

## 2011-01-29 NOTE — Telephone Encounter (Signed)
TB skin test results recorded on paperwork; Wife is aware that paperwork is ready and all other shots/tests will need to go through PCP. I have faxed the information back to [phone] at wife's request; ok per patient to do this.  Copy sent to scan in EPIC.

## 2011-01-29 NOTE — Telephone Encounter (Signed)
Pt wife called and stated that the pt will also need an MMR titer as well as anything else the paperwork states. I will forward this message back to [facility] because she has the paperwork. Carron Curie, CMA

## 2011-02-01 ENCOUNTER — Encounter: Payer: Self-pay | Admitting: *Deleted

## 2011-02-02 ENCOUNTER — Ambulatory Visit: Payer: Self-pay | Admitting: Internal Medicine

## 2011-02-02 NOTE — Progress Notes (Signed)
Quick Note:  LMTCB ______ 

## 2011-02-07 ENCOUNTER — Other Ambulatory Visit: Payer: Self-pay | Admitting: Internal Medicine

## 2011-02-07 MED ORDER — FLUTICASONE PROPIONATE 50 MCG/ACT NA SUSP: 2.0000 | Freq: Every day | NASAL | Status: DC

## 2011-02-07 MED ORDER — BUDESONIDE 180 MCG/ACT IN AEPB: 4.0000 | INHALATION_SPRAY | Freq: Two times a day (BID) | RESPIRATORY_TRACT | Status: DC

## 2011-02-07 MED ORDER — IPRATROPIUM-ALBUTEROL 18-103 MCG/ACT IN AERO: 2.0000 | INHALATION_SPRAY | Freq: Three times a day (TID) | RESPIRATORY_TRACT | Status: DC

## 2011-02-07 NOTE — Telephone Encounter (Signed)
Per CY-- ok to fill 

## 2011-02-12 NOTE — Progress Notes (Signed)
Quick Note:  Left message to call back-will send letter if no response on Wednesday1-03-2011 ______

## 2011-02-14 ENCOUNTER — Telehealth: Payer: Self-pay | Admitting: Internal Medicine

## 2011-02-14 NOTE — Telephone Encounter (Signed)
Jesus Cummings not at work at this time-will need to call back in the morning.

## 2011-02-16 ENCOUNTER — Other Ambulatory Visit: Payer: Self-pay | Admitting: *Deleted

## 2011-02-16 MED ORDER — FLUTICASONE PROPIONATE 50 MCG/ACT NA SUSP: 2.0000 | Freq: Two times a day (BID) | NASAL | Status: DC

## 2011-02-16 NOTE — Telephone Encounter (Signed)
Jesus Cummings is aware of results. Stated patient is doing good so far other than a slight temp which he is using Advil and understands to call the office if temp continues. Jesus Cummings requested that medical records be sent to [facility] for research program; was referred to medical records to get release form.

## 2011-02-16 NOTE — Telephone Encounter (Signed)
Clydie Braun aware that this has been updated in chart and sent.

## 2011-02-16 NOTE — Progress Notes (Signed)
Quick Note:  Jesus Cummings is aware of results. Stated patient is doing good so far other than a slight temp which he is using Advil and understands to call the office if temp continues. Jesus Cummings requested that medical records be sent to [facility] for research program; was referred to medical records to get release form. ______

## 2011-02-16 NOTE — Telephone Encounter (Signed)
Not able to tell why Jesus Cummings was returning Katie's call.  Was it the sputum culture results?  Katie please advise, thanks.

## 2011-02-22 ENCOUNTER — Telehealth: Payer: Self-pay | Admitting: Internal Medicine

## 2011-02-22 NOTE — Telephone Encounter (Signed)
Error.Jesus Cummings ° °

## 2011-02-28 ENCOUNTER — Telehealth: Payer: Self-pay | Admitting: Internal Medicine

## 2011-02-28 ENCOUNTER — Other Ambulatory Visit: Payer: Self-pay | Admitting: *Deleted

## 2011-02-28 ENCOUNTER — Other Ambulatory Visit: Payer: BC Managed Care – PPO

## 2011-02-28 DIAGNOSIS — J42 Unspecified chronic bronchitis: Secondary | ICD-10-CM

## 2011-02-28 NOTE — Telephone Encounter (Signed)
Per CY-okay to place order for routine c&s fungal and AFB smear. Also, CY approved to send CT scan to Dr Theresia Bough.

## 2011-02-28 NOTE — Telephone Encounter (Signed)
Below is the note:  Happy New Year!  Hi, Katie,  Just wanted to let you know that I dropped off a sputum sample for Jesus Cummings, 11/08/1960 at 11:15 am on 02/28/11.  He produced it 02/27/11 around 4:00 pm and put in fridge.  He hasn't been feeling well and coughed up more than usual-since he had that [critter] growing in his last sample, thought we'd check again.  Also, is it possible to have his last CT scan e-mailed or mailed to [facility] Dr. Theresia Bough?  Medical recordshas the request but they don't have a release form to talk to me and I know you do!  Could you call them?    Many thanks!! Scherrie Gerlach

## 2011-02-28 NOTE — Telephone Encounter (Signed)
CY-please advise if you would like an order for sputum culture or not. Thanks.

## 2011-03-01 NOTE — Telephone Encounter (Signed)
ATC Clydie Braun to get information as where to send CT scan to-need location and fax number of Dr.

## 2011-03-03 LAB — RESPIRATORY CULTURE OR RESPIRATORY AND SPUTUM CULTURE: Organism ID, Bacteria: NORMAL

## 2011-03-07 NOTE — Telephone Encounter (Signed)
ATC Clydie Braun NA [facility]

## 2011-03-08 NOTE — Telephone Encounter (Signed)
ATC Clydie Braun. Phone rang once and then line went silent x 3. WCB. Carron Curie, CMA

## 2011-03-08 NOTE — Telephone Encounter (Signed)
I have left a message at Karen's work - [phone] for her to call me with information on Dr Theresia Bough to fax the CT to him.

## 2011-03-08 NOTE — Telephone Encounter (Signed)
Pt's wife says she's emailed dr's office and waiting to hear back from them also wants to know what papers Florentina Addison will be sending to his office.Raylene Everts

## 2011-03-12 ENCOUNTER — Telehealth: Payer: Self-pay | Admitting: Internal Medicine

## 2011-03-12 NOTE — Telephone Encounter (Signed)
Returning call can be reached at [phone].Jesus Cummings

## 2011-03-12 NOTE — Telephone Encounter (Signed)
Error.Jesus Cummings ° °

## 2011-03-12 NOTE — Telephone Encounter (Signed)
I have spoken with Jesus Cummings; she is aware that we only have CXR results not CT scan that she was thinking the patient had done. She will get with Dr Cherlyn Roberts office and if anything is needed then she will call to have me fax these for patient.

## 2011-03-12 NOTE — Telephone Encounter (Signed)
Spoke with Clydie Braun She states needs the disc mailed to Dr. Zetta Bills. His fax number is [phone]. This is the only information that she has for this doctor. She is also wanting the results of sputum culture faxed to him.  Will forward to [facility] per her request.

## 2011-03-25 ENCOUNTER — Other Ambulatory Visit: Payer: Self-pay | Admitting: Internal Medicine

## 2011-03-26 NOTE — Telephone Encounter (Signed)
Received a refill request from [facility] for pt's cipro. Please advise if okay to refill Dr. Maple Hudson, thanks

## 2011-03-26 NOTE — Telephone Encounter (Signed)
Per CY-okay to refill cipro Rx. Done.

## 2011-04-02 ENCOUNTER — Other Ambulatory Visit: Payer: Self-pay | Admitting: Internal Medicine

## 2011-04-03 LAB — FUNGUS CULTURE W SMEAR: Smear Result: NONE SEEN

## 2011-04-19 ENCOUNTER — Other Ambulatory Visit: Payer: Self-pay | Admitting: Internal Medicine

## 2011-05-05 ENCOUNTER — Other Ambulatory Visit: Payer: Self-pay | Admitting: Internal Medicine

## 2011-05-07 ENCOUNTER — Telehealth: Payer: Self-pay | Admitting: Internal Medicine

## 2011-05-07 NOTE — Telephone Encounter (Signed)
Please advise if okay to send refill for patient. Thanks.

## 2011-05-07 NOTE — Telephone Encounter (Signed)
Contacted pt and patient states she is having excess fluid in lungs and has a cough with green mucus. Also sob. Denies chest pain and chest tightness. Patient is requesting Cipro. Please advise.

## 2011-05-09 NOTE — Telephone Encounter (Signed)
Called spoke with patient who stated that he has been on levaquin previously and feels that it does not work.  Is requesting cipro.  Pt aware CDY not in office this afternoon and is okay to wait until CDY returns to the office in the morning.

## 2011-05-09 NOTE — Telephone Encounter (Signed)
Katie, do you know anything about this? I do not see and rx for levaquin for this patient in system? Was it a paper refill? Please advise. Carron Curie, CMA

## 2011-05-09 NOTE — Telephone Encounter (Signed)
lmomtcb to inform pt levaquin was sent electronically to his pharm.

## 2011-05-09 NOTE — Telephone Encounter (Signed)
Ok to refill 

## 2011-05-09 NOTE — Telephone Encounter (Signed)
I had just okay'ed Rx levaquin for Mr Dettmer

## 2011-05-09 NOTE — Telephone Encounter (Signed)
Pt wants rx for cipro. Says levaquin is not "cutting it". Lungs very tight and he "specifically" wants cipro. [facility] [address]. Pt ph# [phone]. Hazel Sams

## 2011-05-09 NOTE — Telephone Encounter (Signed)
Levaquin was just sent electronically to his pharmacy.

## 2011-05-10 MED ORDER — CIPROFLOXACIN HCL 500 MG PO TABS: 500.0000 mg | ORAL_TABLET | Freq: Two times a day (BID) | ORAL | Status: DC

## 2011-05-10 NOTE — Telephone Encounter (Signed)
Pt aware of prescription and this has been sent to [facility] on [address] and [address].

## 2011-05-10 NOTE — Telephone Encounter (Signed)
Ok to Rx Cipro 500 mg, # 20, 1 twice daily, refill x 1.

## 2011-05-19 ENCOUNTER — Other Ambulatory Visit: Payer: Self-pay | Admitting: Internal Medicine

## 2011-06-26 ENCOUNTER — Other Ambulatory Visit: Payer: Self-pay | Admitting: Internal Medicine

## 2011-07-05 ENCOUNTER — Encounter: Payer: Self-pay | Admitting: Internal Medicine

## 2011-07-05 ENCOUNTER — Ambulatory Visit (INDEPENDENT_AMBULATORY_CARE_PROVIDER_SITE_OTHER): Payer: BC Managed Care – PPO | Admitting: Internal Medicine

## 2011-07-05 VITALS — BP 110/68 | HR 99 | Ht 69.0 in | Wt 195.2 lb

## 2011-07-05 DIAGNOSIS — J42 Unspecified chronic bronchitis: Secondary | ICD-10-CM

## 2011-07-05 DIAGNOSIS — J310 Chronic rhinitis: Secondary | ICD-10-CM

## 2011-07-05 NOTE — Progress Notes (Signed)
Patient ID: Jesus Cummings, male    DOB: Oct 22, 1960, 51 y.o.   MRN: [id number]  HPI 06/26/10- [age] yoM never smoker followed for chronic sinusitis and a CF-variant with chronic bronchitis, complicated by hx pulmonary hypertension.  Last here January 10, 2010. He has been getting his aerosol Tobi recently through a face mask to deliver it to nose and lower airway. He wants to try that for his pulmozyme as well. We discussed salinity of aerosols and airway irritability.  Swimming keeps his airways open, otherwise cough gets productive. He does use Flutter and VEST for pulmonary toilet. Denies change in exercise tolerance over time. Cipro has been his best fall-back drug. We talked about newer drugs. Worst time seems to be Fall and Spring. Most likely it is virus infections rather than seasonal allergy at those times.  12/03/10- 49 yoM never smoker followed for chronic sinusitis and a CF-variant with chronic bronchitis, complicated by hx pulmonary hypertension. ...wife here For at least the last month he reports wheezing too much which he calls asthma. Feels a coating in his airways, but cough is dry. Scant mucus from nose and chest. We had called in Levaquin but he says that never works well but did help some as he finishes his second week. Nasal discharge is still yellow with nose blowing and postnasal drip. We discussed his previous experience with several antibiotics and he remembers SwedenFactiv working well. 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 Because he was not particularly symptomatic, we chose to wait on treatment in hopes other flora would take over. He is still swimming but feels hot and cold with muscle aches after swimming. Wife has tried to take his temperature and suspects low grade fevers. He is taking more naps. He feels his chest stays clear only about a week after each antibiotic. 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  01/22/11- 49 yoM never smoker followed for chronic sinusitis and a CF-variant with chronic bronchitis, complicated by hx pulmonary hypertension. ...wife here Had flu vaccine. Has been working with Dr Comer/ ID. 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 AFB smear and culture were negative.  This weekend his chest felt tight her with deep breath and he was more dyspneic. No fever or sweat. Today he feels much improved and went for a good walk yesterday although he still doesn't feel well enough to swimming. Chest x-ray 12/04/2010 showed scarring with no active process. He describes getting extremely smothered trying to lie supine long enough to have the PICC line placed. This is unusual for him. His feet have not been swelling. He has no residual chest discomfort other than his chronic cough. He has been using a Estate agentVest percusor, Flutter, and wife does chest PT. we discussed using an incentive spirometer to help him assess how deep of breath he is taking.  07/05/11- 49 yoM never smoker followed for chronic sinusitis and a CF-variant with chronic bronchitis, complicated by hx pulmonary hypertension. ...wife here  "doing pretty good with breathing"; denies SOB, or wheezing, no cough. Still having the usual chest congestion. H. stop his Daliresp as "not needed". He continues to swim regularly for exercise. After an unproductive visit with [facility] Luc[facility] he had evaluation by Dr Nils FlackPedar Noone [facility] He was hospitalized there  for 2 days with intravenous tobramycin and ceftaz been home for 3 weeks with those medications by PICC line. 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 He also went to ENT at [facility] and they're planning to see him 4 times per year for "sinus cleaning". A  different kind of pulmonary toilet device was suggested. He tried a VEST for pulmonary toilet but has not been using it routinely. He continues nebulized tobramycin 3 weeks on and 3 weeks off.  Review of Systems- see HPI Constitutional:   No-   weight loss, night sweats,  +fevers, chills, fatigue, lassitude. HEENT:   No-  headaches, difficulty swallowing, tooth/dental problems, sore throat,       No-  sneezing, itching, ear ache,  +nasal congestion, post nasal drip,  CV:  No-   chest pain, orthopnea, PND, swelling in lower extremities, anasarca, dizziness, palpitations Resp: +  shortness of breath with exertion or at rest.              Less   productive cough,  No non-productive cough,  No- coughing up of blood.              + change in color of mucus.  No- wheezing.   Skin: No-   rash or lesions. GI:  No-   heartburn, indigestion, abdominal pain, nausea, vomiting, GU: ain. MS:  No-   joint pain or swelling.  . Neuro-     nothing unusual Psych:  No- change in mood or affect. No depression or anxiety.  No memory loss.   Objective:   Physical Exam General- Alert, Oriented, Affect-appropriate, Distress- none acute Skin- rash-none, lesions- none, excoriation- none Lymphadenopathy- none Head- atraumatic            Eyes- Gross vision intact, PERRLA, conjunctivae clear secretions            Ears- Hearing, canals-normal            Nose- Clear, no-Septal dev, mucus, polyps, erosion, perforation             Throat- Mallampati II , mucosa clear , drainage- none, tonsils- atrophic Neck- flexible , trachea midline, no stridor , thyroid nl, carotid no bruit Chest - symmetrical excursion , unlabored           Heart/CV- RRR , no murmur , no gallop  , no rub, nl s1 s2                           - JVD- none , edema- none, stasis changes- none, varices- none           Lung- t mild rhonchi in bases right greater than left, wheeze- none, cough- none , dullness-none, rub- none. Unlabored           Chest  wall-  Abd-  Br/ Gen/ Rectal- Not done, not indicated Extrem- cyanosis- none, clubbing, none, atrophy- none, strength- nl Neuro- grossly intact to observation

## 2011-07-05 NOTE — Patient Instructions (Addendum)
Continue present treatments.    Please call as needed 

## 2011-07-10 NOTE — Assessment & Plan Note (Signed)
Chronic bronchitis due to 2 impaired airway clearance. The mechanism is not well-defined. It had been considered a variant of cystic fibrosis but [name] is indicating it may be a ciliary dyskinesia. Mr. Jesus Cummings was not willing to undergo a biopsy procedure for like on microscopy.  assisted mucus clearance remains the primary therapeutic option. He'll continue use of his current medications which are refilled, including Pulmicort, Combivent, Pulmozyme and TOBI

## 2011-07-10 NOTE — Assessment & Plan Note (Signed)
Plan-continue Flonase.

## 2011-08-17 ENCOUNTER — Other Ambulatory Visit: Payer: Self-pay | Admitting: *Deleted

## 2011-08-17 ENCOUNTER — Telehealth: Payer: Self-pay | Admitting: Internal Medicine

## 2011-08-17 MED ORDER — DORNASE ALFA 2.5 MG/2.5ML IN SOLN: 2.5000 mg | Freq: Every day | RESPIRATORY_TRACT | Status: DC

## 2011-08-17 MED ORDER — NONFORMULARY OR COMPOUNDED ITEM: Status: AC

## 2011-08-17 MED ORDER — IPRATROPIUM-ALBUTEROL 20-100 MCG/ACT IN AERS: 1.0000 | INHALATION_SPRAY | Freq: Four times a day (QID) | RESPIRATORY_TRACT | Status: DC

## 2011-08-17 MED ORDER — BUDESONIDE 180 MCG/ACT IN AEPB: 4.0000 | INHALATION_SPRAY | Freq: Two times a day (BID) | RESPIRATORY_TRACT | Status: DC

## 2011-08-17 MED ORDER — SINUS RINSE REFILL NA PACK: PACK | NASAL | Status: DC

## 2011-08-17 MED ORDER — FLUTICASONE PROPIONATE 50 MCG/ACT NA SUSP: NASAL | Status: DC

## 2011-08-17 MED ORDER — NONFORMULARY OR COMPOUNDED ITEM: Status: DC

## 2011-08-17 MED ORDER — FLUTICASONE PROPIONATE 50 MCG/ACT NA SUSP: 2.0000 | Freq: Two times a day (BID) | NASAL | Status: DC

## 2011-08-17 MED ORDER — PSEUDOEPHEDRINE-GUAIFENESIN ER 120-1200 MG PO TB12: ORAL_TABLET | ORAL | Status: DC

## 2011-08-17 MED ORDER — IPRATROPIUM-ALBUTEROL 20-100 MCG/ACT IN AERS: 1.0000 | INHALATION_SPRAY | Freq: Four times a day (QID) | RESPIRATORY_TRACT | Status: DC | PRN

## 2011-08-17 MED ORDER — TOBRAMYCIN 300 MG/5ML IN NEBU: 300.0000 mg | INHALATION_SOLUTION | Freq: Two times a day (BID) | RESPIRATORY_TRACT | Status: DC

## 2011-08-17 NOTE — Telephone Encounter (Signed)
PHONE MSG CLOSED BY ACCIDENT  ---  Combivent changed called into Gina at [facility] who verbalized understanding.  ATC pt's home # to inform him of change but NA and unable to leave msg - will hold in triage to f/u on as msg was closed by accident.

## 2011-08-17 NOTE — Telephone Encounter (Signed)
Pt wants 30 day supply printed RX's for the following: Pulmicort, Combivent,TOBI, Pulmozyme, Flonase, Mucinex DM Mx Strength,Nelimed isotonic saline packets, Neilmed hypertonic saline packets, Neilmed sinus refill kit with bottle, and [facility] isotonic saline packets. Each printed and given to CY to sign. Pt aware and took with him today.

## 2011-08-17 NOTE — Telephone Encounter (Signed)
Change Combivent Respimat sig to     1(one) puff, 4 times daily as needed,   May refill prn

## 2011-08-17 NOTE — Telephone Encounter (Signed)
Dr. Maple Hudson, pt's med list states he takes the combivent inhaler 2 puffs tid.  Pharmacy needs to change the regular inhaler to combivent respimat inhaler.  Do you want the instructions to remain the same?  Pls advise.  Thank you.

## 2011-08-29 ENCOUNTER — Other Ambulatory Visit: Payer: Self-pay | Admitting: Internal Medicine

## 2011-08-31 ENCOUNTER — Telehealth: Payer: Self-pay | Admitting: Internal Medicine

## 2011-08-31 NOTE — Telephone Encounter (Signed)
Pt would like to use albuterol as extra inhaler when swimming-states he was told not to use Combivent too much and when swimming. Please advise.   Pt aware that CY back in office on Monday 09-03-11.

## 2011-09-03 ENCOUNTER — Telehealth: Payer: Self-pay | Admitting: Internal Medicine

## 2011-09-03 NOTE — Telephone Encounter (Signed)
Returning call can be reached at [phone].Jesus Cummings

## 2011-09-03 NOTE — Telephone Encounter (Signed)
okto send script albuterol HFA inhaler, # 1, 2 puffs, every 6 hours if needed, refill prn

## 2011-09-03 NOTE — Telephone Encounter (Signed)
lmomtcb  

## 2011-09-03 NOTE — Telephone Encounter (Signed)
lmovm to cy recommendations

## 2011-09-03 NOTE — Telephone Encounter (Signed)
Called, spoke with Clydie Braun who states pt is having hives every time he swims.  Reports he was using albuterol hfa prior to swimming but stopped this because the hives started when he started the albuterol.  He hasn't been using the albuterol and is still getting the hives.  Would like to know if an antihistamine would help -- if so, which one would Dr. Maple Hudson rec.  Dr. Maple Hudson, pls advise.  Thank you.

## 2011-09-03 NOTE — Telephone Encounter (Signed)
Per cy   ALBUTEROL hfA #1 <> 2 PUFFS  Q 4 HOURS PRN  REFILL PRN

## 2011-09-04 MED ORDER — ALBUTEROL SULFATE HFA 108 (90 BASE) MCG/ACT IN AERS: 2.0000 | INHALATION_SPRAY | Freq: Four times a day (QID) | RESPIRATORY_TRACT | Status: DC | PRN

## 2011-09-04 NOTE — Telephone Encounter (Signed)
rx sent. Pt is aware. Jennifer Castillo, CMA  

## 2011-09-10 ENCOUNTER — Telehealth: Payer: Self-pay | Admitting: Internal Medicine

## 2011-09-10 NOTE — Telephone Encounter (Signed)
ATC, NA and no option to leave a msg, WCB 

## 2011-09-11 ENCOUNTER — Telehealth: Payer: Self-pay | Admitting: Internal Medicine

## 2011-09-11 MED ORDER — IPRATROPIUM-ALBUTEROL 20-100 MCG/ACT IN AERS: 1.0000 | INHALATION_SPRAY | Freq: Four times a day (QID) | RESPIRATORY_TRACT | Status: DC | PRN

## 2011-09-11 MED ORDER — BUDESONIDE 180 MCG/ACT IN AEPB: INHALATION_SPRAY | RESPIRATORY_TRACT | Status: DC

## 2011-09-11 NOTE — Telephone Encounter (Signed)
Done already- see 09/10/11 phone note

## 2011-09-11 NOTE — Telephone Encounter (Signed)
Called, spoke with pt who is requesting we sent combivent respimat rx  and a 90 day supply of Pulmicort Inhaler (uses 4 puffs bid) to [facility].  Rxs sent.  Pt aware.

## 2011-09-17 ENCOUNTER — Telehealth: Payer: Self-pay | Admitting: Internal Medicine

## 2011-09-18 MED ORDER — DORNASE ALFA 2.5 MG/2.5ML IN SOLN: 2.5000 mg | Freq: Every day | RESPIRATORY_TRACT | Status: DC

## 2011-09-18 MED ORDER — IPRATROPIUM-ALBUTEROL 20-100 MCG/ACT IN AERS: 1.0000 | INHALATION_SPRAY | Freq: Four times a day (QID) | RESPIRATORY_TRACT | Status: DC | PRN

## 2011-09-18 NOTE — Telephone Encounter (Signed)
lmomtcb  

## 2011-09-18 NOTE — Telephone Encounter (Signed)
Called, spoke with pt's wife who states [facility] did not receive the directions for the combivent Respimat.  Also, requesting rx to be faxed to [facility] for pulmozyme.  Pulmozyme rx has been sent electronically to [facility].  Called [facility] spoke with Lawson Fiscal, who did verify they needed directions for the combivent respimat.  I informed her this should be Inhale 1 puff into the lungs 4 (four) times daily as needed for wheezing # 3 inhalers with 1 additional refill.  She verbalized understanding of this and voiced no further questions/concerns at this time.  Clydie Braun, pt's wife, aware of above.

## 2011-09-18 NOTE — Telephone Encounter (Signed)
Spouse returned call. Jesus Cummings °

## 2011-09-18 NOTE — Telephone Encounter (Signed)
Returning call can be reached at [phone] says ok to lmom.Jesus Cummings

## 2011-10-24 ENCOUNTER — Other Ambulatory Visit: Payer: Self-pay | Admitting: Internal Medicine

## 2011-10-24 NOTE — Telephone Encounter (Signed)
Please advise if okay to refill. Thanks.  

## 2011-10-25 NOTE — Telephone Encounter (Signed)
Ok to refill cipro

## 2011-11-23 ENCOUNTER — Telehealth: Payer: Self-pay | Admitting: Internal Medicine

## 2011-11-23 MED ORDER — TOBRAMYCIN 300 MG/5ML IN NEBU: 300.0000 mg | INHALATION_SOLUTION | Freq: Two times a day (BID) | RESPIRATORY_TRACT | Status: AC

## 2011-11-23 NOTE — Telephone Encounter (Signed)
I called [facility] and gave verbal order for this. I spoke with Brittia from [facility]. I spoke with karen and is aware of this. Nothing further was needed.

## 2011-11-23 NOTE — Telephone Encounter (Signed)
Spoke with pharmacist at [facility] and gave directions for tobi again Nothing further needed

## 2012-01-07 ENCOUNTER — Ambulatory Visit: Payer: BC Managed Care – PPO | Admitting: Internal Medicine

## 2012-02-15 ENCOUNTER — Telehealth: Payer: Self-pay | Admitting: Internal Medicine

## 2012-02-15 MED ORDER — OSELTAMIVIR PHOSPHATE 75 MG PO CAPS: 75.0000 mg | ORAL_CAPSULE | Freq: Two times a day (BID) | ORAL | Status: DC

## 2012-02-15 NOTE — Telephone Encounter (Signed)
Called, spoke with pt.  C/o swollen glands in neck, coughing but not often, irritation in nose, PND, some SOB, and runny nose with clear to white mucus.  States when he does cough, his chest feels raw.  Had wheezing a few days ago.  Denies HA, sinus pressure, f/c/s, or body aches.  Requesting recs -- Dr. Maple Hudson, pls advise.  Thank you.  Last OV with [facility] 07/05/11 -- was asked to f/u in 6 months.  No pending appt.  [facility]  [facility] - verified with pt

## 2012-02-15 NOTE — Telephone Encounter (Signed)
Spoke with pt and notified of recs per CDY Rx was sent to pharm

## 2012-02-15 NOTE — Telephone Encounter (Signed)
Per CY-okay to give Tamiflu #10 take 1 po bid no refills.

## 2012-07-01 IMAGING — US IR FLUORO GUIDE CV LINE*R*
1 series · 1 of 1 positions shown · non-contrast
Comparison: none

CLINICAL DATA: Cystic fibrosis, needs IV access for antibiotics

PICC PLACEMENT WITH ULTRASOUND AND FLUOROSCOPY
TECHNIQUE: After written informed consent was obtained, patient was
placed in the supine position on angiographic table. Patency of the
right basilic vein was confirmed with ultrasound with image
documentation. An appropriate skin site was determined. Skin site
was marked. Region was prepped using maximum barrier technique
including cap and mask, sterile gown, sterile gloves, large sterile
sheet, and Chlorhexidine   as cutaneous antisepsis.  The region was
infiltrated locally with 1% lidocaine.   Under real-time ultrasound
guidance, the right basilic vein was accessed with a 21 gauge
micropuncture needle; the needle tip within the vein was confirmed
with ultrasound image documentation.   Needle exchanged over a 018
guidewire for a peel-away sheath, through which a 5-French single-
lumen power injectable PICC trimmed to 43cm was advanced,
positioned with its tip near the cavoatrial junction.  Spot chest
radiograph confirms appropriate catheter position.  Catheter was
flushed per protocol and secured externally with 0-Prolene sutures.
The patient tolerated procedure well, with no immediate
complication.

[Series 1: ir fluoro guide cv midline picc *left* · 1 of 1 slices shown]
[im 1/1]
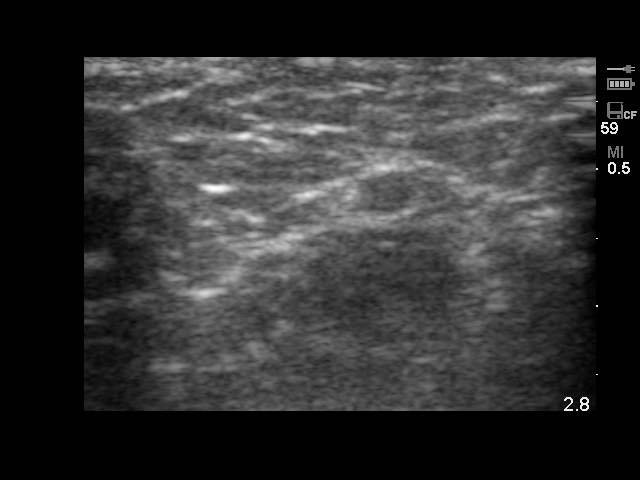

[1 of 1 positions shown; findings below may reference images not displayed]

IMPRESSION: Technically successful five French single lumen power injectable
PICC placement

## 2012-07-27 IMAGING — CR DG CHEST 2V
2 series · 2 of 2 positions shown · non-contrast
Comparison: Chest x-ray of 12/04/2010

CLINICAL DATA: Bronchitis, shortness of breath

CHEST - 2 VIEW

[view not recorded (1 of 2)]
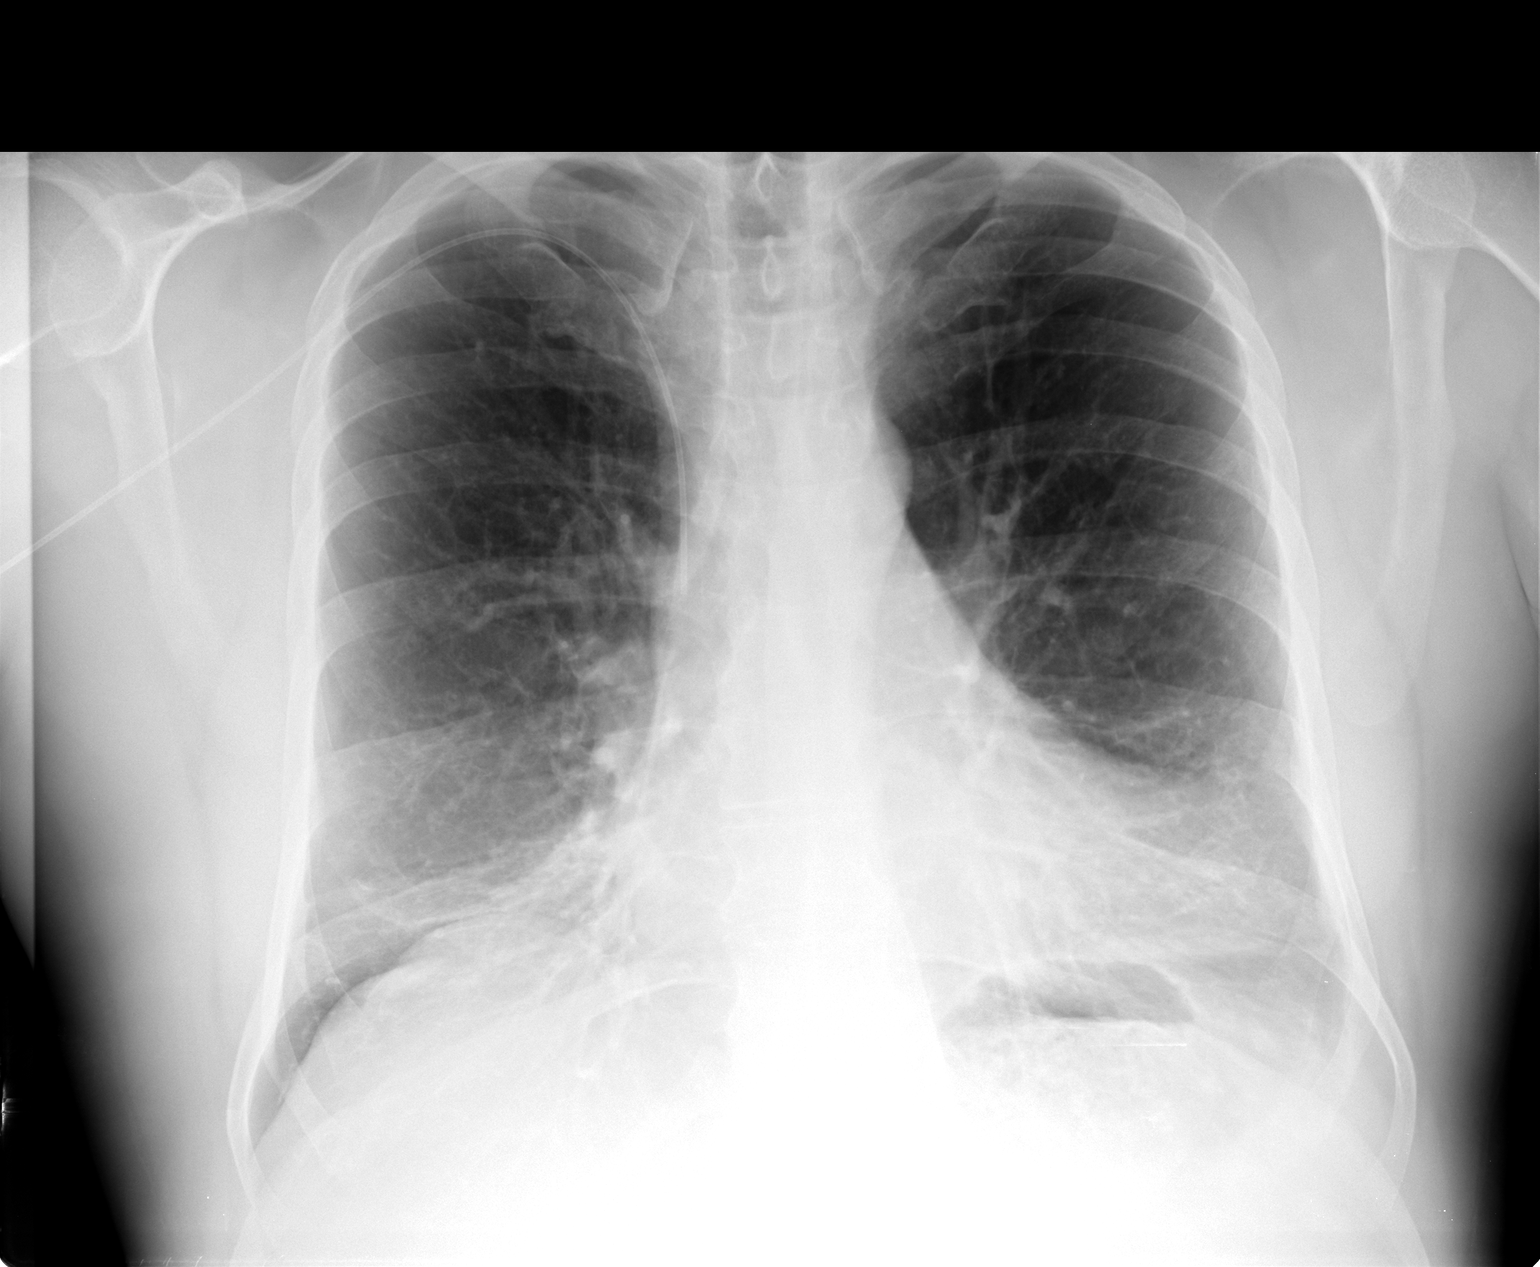

[view not recorded (2 of 2)]
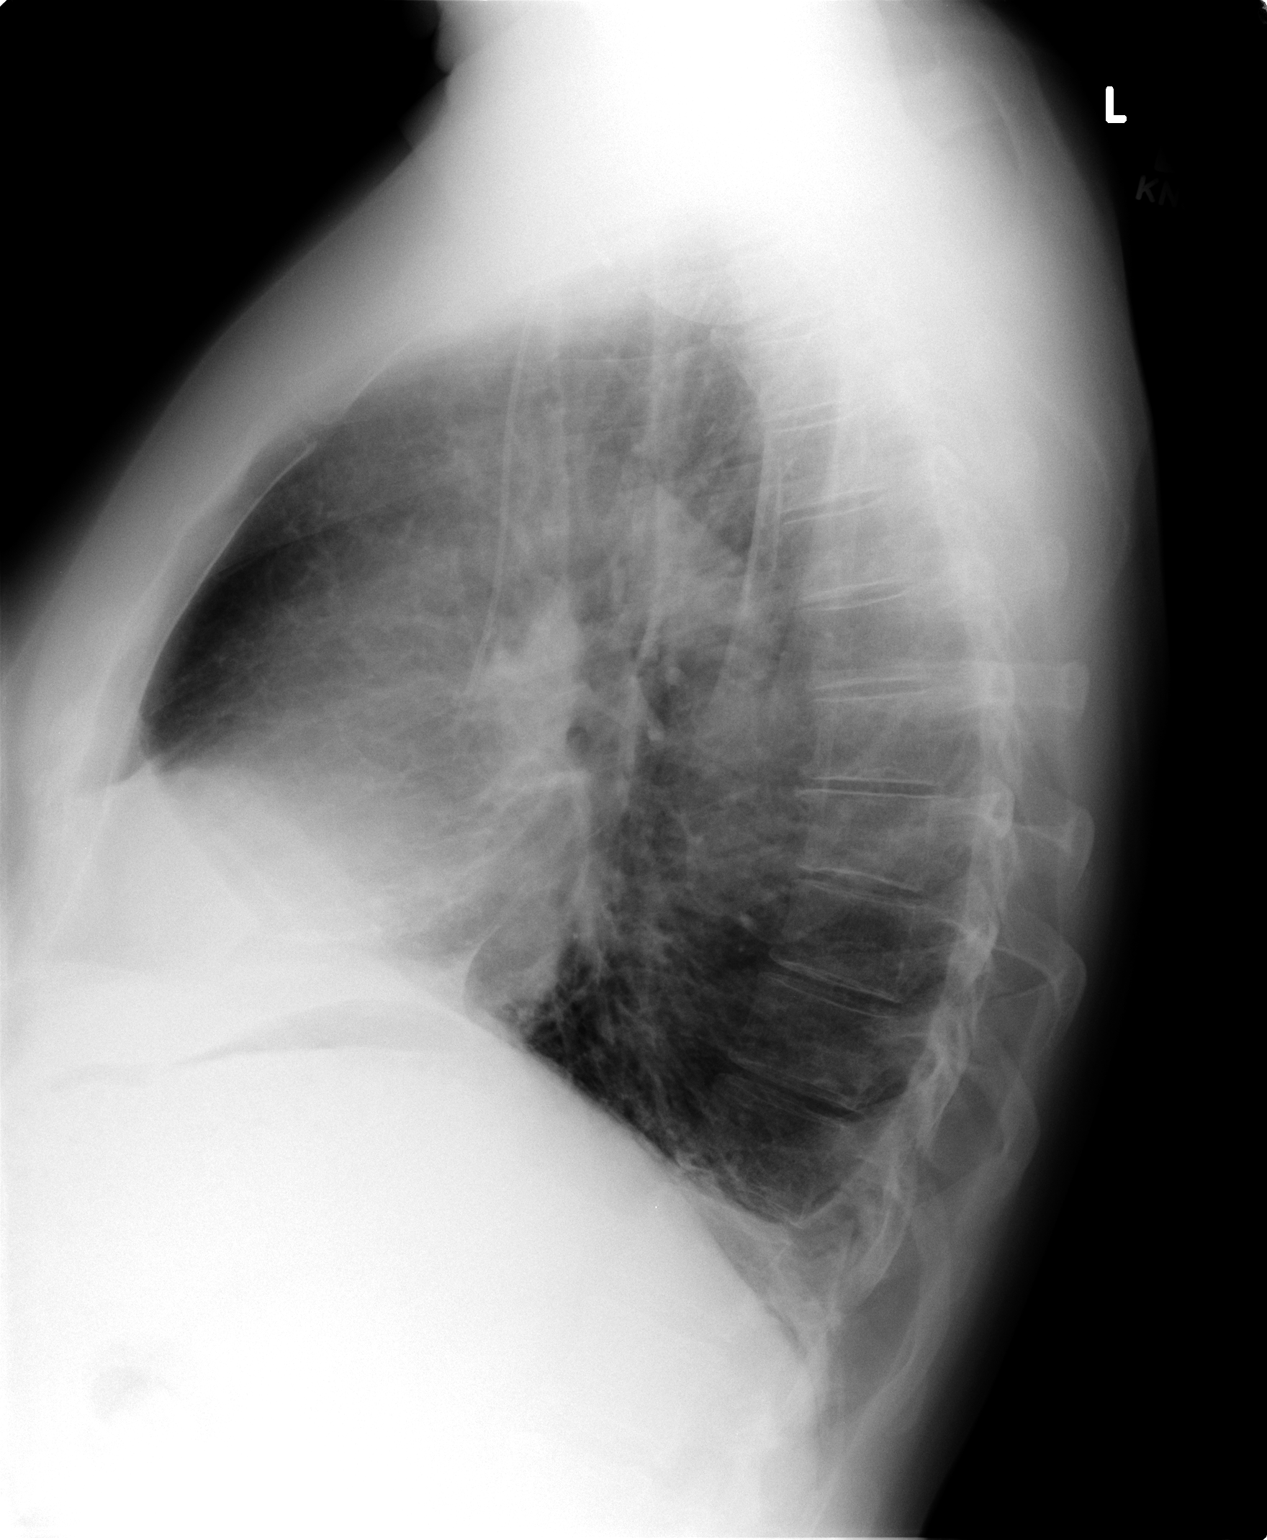

[2 of 2 positions shown; findings below may reference images not displayed]

FINDINGS: The lungs are hyperaerated consistent with COPD.  Basilar
linear scarring is noted.  There does appear to be some blunting of
the left costophrenic angle which appears chronic.  The heart is
within upper limits of normal.  No acute bony abnormality is seen.
A right upper extremity PICC line is present with the tip seen to
the right atrium.
IMPRESSION: Basilar scarring.  No definite active process.  Right PICC line tip
seen to the right atrium.

## 2012-08-05 ENCOUNTER — Encounter: Payer: Self-pay | Admitting: Internal Medicine

## 2012-08-05 ENCOUNTER — Ambulatory Visit (INDEPENDENT_AMBULATORY_CARE_PROVIDER_SITE_OTHER): Payer: BC Managed Care – PPO | Admitting: Internal Medicine

## 2012-08-05 VITALS — BP 122/84 | HR 87 | Ht 68.25 in | Wt 196.4 lb

## 2012-08-05 DIAGNOSIS — J42 Unspecified chronic bronchitis: Secondary | ICD-10-CM

## 2012-08-05 MED ORDER — CIPROFLOXACIN HCL 500 MG PO TABS: ORAL_TABLET | ORAL | Status: DC

## 2012-08-05 NOTE — Patient Instructions (Addendum)
Script to hold for cipro to use when you feel you need  Continue your pulmonary clearing methods

## 2012-08-05 NOTE — Progress Notes (Signed)
Patient ID: Jesus Cummings, male    DOB: Oct 22, 1960, 52 y.o.   MRN: [id number]  HPI 06/26/10- [age] yoM never smoker followed for chronic sinusitis and a CF-variant with chronic bronchitis, complicated by hx pulmonary hypertension.  Last here January 10, 2010. He has been getting his aerosol Tobi recently through a face mask to deliver it to nose and lower airway. He wants to try that for his pulmozyme as well. We discussed salinity of aerosols and airway irritability.  Swimming keeps his airways open, otherwise cough gets productive. He does use Flutter and VEST for pulmonary toilet. Denies change in exercise tolerance over time. Cipro has been his best fall-back drug. We talked about newer drugs. Worst time seems to be Fall and Spring. Most likely it is virus infections rather than seasonal allergy at those times.  12/03/10- 49 yoM never smoker followed for chronic sinusitis and a CF-variant with chronic bronchitis, complicated by hx pulmonary hypertension. ...wife here For at least the last month he reports wheezing too much which he calls asthma. Feels a coating in his airways, but cough is dry. Scant mucus from nose and chest. We had called in Levaquin but he says that never works well but did help some as he finishes his second week. Nasal discharge is still yellow with nose blowing and postnasal drip. We discussed his previous experience with several antibiotics and he remembers SwedenFactiv working well. 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 Because he was not particularly symptomatic, we chose to wait on treatment in hopes other flora would take over. He is still swimming but feels hot and cold with muscle aches after swimming. Wife has tried to take his temperature and suspects low grade fevers. He is taking more naps. He feels his chest stays clear only about a week after each antibiotic. 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  01/22/11- 49 yoM never smoker followed for chronic sinusitis and a CF-variant with chronic bronchitis, complicated by hx pulmonary hypertension. ...wife here Had flu vaccine. Has been working with Dr Comer/ ID. 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 AFB smear and culture were negative.  This weekend his chest felt tight her with deep breath and he was more dyspneic. No fever or sweat. Today he feels much improved and went for a good walk yesterday although he still doesn't feel well enough to swimming. Chest x-ray 12/04/2010 showed scarring with no active process. He describes getting extremely smothered trying to lie supine long enough to have the PICC line placed. This is unusual for him. His feet have not been swelling. He has no residual chest discomfort other than his chronic cough. He has been using a Estate agentVest percusor, Flutter, and wife does chest PT. we discussed using an incentive spirometer to help him assess how deep of breath he is taking.  07/05/11- 49 yoM never smoker followed for chronic sinusitis and a CF-variant with chronic bronchitis, complicated by hx pulmonary hypertension. ...wife here  "doing pretty good with breathing"; denies SOB, or wheezing, no cough. Still having the usual chest congestion. H. stop his Daliresp as "not needed". He continues to swim regularly for exercise. After an unproductive visit with [facility] Luc[facility] he had evaluation by Dr Nils FlackPedar Noone with [facility]. He was hospitalized there  for 2 days with intravenous tobramycin and ceftaz been home for 3 weeks with those medications by PICC line. 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 He also went to ENT at [facility] and they're planning to see him 4 times per year for "sinus cleaning". A  different kind of pulmonary toilet device was suggested. He tried a VEST for pulmonary toilet but has not been using it routinely. He continues nebulized tobramycin 3 weeks on and 3 weeks off.  08/05/12- 51 yoM never smoker followed for chronic sinusitis and a CF-variant with chronic bronchitis, complicated by hx pulmonary hypertension. FOLLOWS FOR: ? start of an infection; past few days had trouble breathing due to humidity. [facility] doubts CT- favors Immotile Cilia.recent thick yellow white mucus. Daily Flutter, swim, continues 3 weeks on and 3 weeks off Tobi neb, does Percussion Vest, Neb Pulmozyme/ Dornase. Never could get nebulized Mucomyst.  Review of Systems- see HPI Constitutional:   No-   weight loss, night sweats,  +fevers, chills, fatigue, lassitude. HEENT:   No-  headaches, difficulty swallowing, tooth/dental problems, sore throat,       No-  sneezing, itching, ear ache,  +nasal congestion, post nasal drip,  CV:  No-   chest pain, orthopnea, PND, swelling in lower extremities, anasarca, dizziness, palpitations Resp: +  shortness of breath with exertion or at rest.              + productive cough,  + non-productive cough,  No- coughing up of blood.              + change in color of mucus.  No- wheezing.   Skin: No-   rash or lesions. GI:  No-   heartburn, indigestion, abdominal pain, nausea, vomiting, GU: ain. MS:  No-   joint pain or swelling.  . Neuro-     nothing unusual Psych:  No- change in mood or affect. No depression or anxiety.  No memory loss.   Objective:   Physical Exam General- Alert, Oriented, Affect-appropriate, Distress- none acute Skin- rash-none, lesions- none, excoriation- none Lymphadenopathy- none Head- atraumatic            Eyes- Gross vision intact, PERRLA, conjunctivae clear secretions            Ears- Hearing, canals-normal            Nose- Clear, no-Septal dev, mucus, polyps, erosion, perforation             Throat- Mallampati II , mucosa clear ,  drainage- none, tonsils- atrophic, + hoarse Neck- flexible , trachea midline, no stridor , thyroid nl, carotid no bruit Chest - symmetrical excursion , unlabored           Heart/CV- RRR , no murmur , no gallop  , no rub, nl s1 s2                           - JVD- none , edema- none, stasis changes- none, varices- none           Lung- no- rhonchi in bases, wheeze-+mild, cough- none , dullness-none, rub- none. Unlabored           Chest wall-  Abd-  Br/ Gen/ Rectal- Not done, not indicated Extrem- cyanosis- none, clubbing, none, atrophy- none, strength- nl Neuro- grossly intact to observation

## 2012-08-19 ENCOUNTER — Encounter: Payer: Self-pay | Admitting: Internal Medicine

## 2012-08-19 NOTE — Assessment & Plan Note (Signed)
Plan-heavy emphasis on pulmonary toilet measures as outlined today's note. Prescription for Cipro to hold.

## 2012-09-12 ENCOUNTER — Telehealth: Payer: Self-pay | Admitting: Internal Medicine

## 2012-09-15 NOTE — Telephone Encounter (Signed)
lmomtcb to let pt know we are checking on the status of thes forms.  Dr. Maple Hudson, pls advise.  Thank you.

## 2012-09-15 NOTE — Telephone Encounter (Signed)
Patient calling to check on the status of paperwork her dropped off Friday.

## 2012-09-15 NOTE — Telephone Encounter (Signed)
Done

## 2012-09-15 NOTE — Telephone Encounter (Signed)
Spouse returned call. Kathleen W Perdue °

## 2012-09-15 NOTE — Telephone Encounter (Signed)
Forms placed at front for pick up.  Pt aware.  ** Copy made and placed in scan folder.

## 2012-09-15 NOTE — Telephone Encounter (Signed)
Crystal did Dr. Maple Hudson give this to you since you are working with him. Please advise thanks

## 2012-11-04 ENCOUNTER — Telehealth: Payer: Self-pay | Admitting: Internal Medicine

## 2012-11-04 MED ORDER — CIPROFLOXACIN HCL 750 MG PO TABS: 750.0000 mg | ORAL_TABLET | Freq: Two times a day (BID) | ORAL | Status: DC

## 2012-11-04 NOTE — Telephone Encounter (Signed)
Clarification per  cipro 750 1 tab twice daily #20. Rx sent and pt is aware. Carron Curie, CMA

## 2012-11-04 NOTE — Telephone Encounter (Signed)
Per CY-okay to give Cipro 750 mg #20 take 1 po TID no refills.

## 2012-11-04 NOTE — Telephone Encounter (Signed)
Last OV 08/05/12. I spoke with Jesus Cummings and he is c/o having a productive cough with yellow phlegm, sinus congestion with yellow mucus, wheezing since Friday. She states if an abx is called in that Cipro 750 usually works the best. Please advise. Carron Curie, CMA No Known Allergies

## 2012-11-25 ENCOUNTER — Other Ambulatory Visit: Payer: Self-pay | Admitting: Internal Medicine

## 2012-11-25 MED ORDER — ALBUTEROL SULFATE HFA 108 (90 BASE) MCG/ACT IN AERS: 2.0000 | INHALATION_SPRAY | Freq: Four times a day (QID) | RESPIRATORY_TRACT | Status: DC | PRN

## 2012-11-25 NOTE — Telephone Encounter (Signed)
Refill sent electronically

## 2013-01-01 ENCOUNTER — Other Ambulatory Visit: Payer: Self-pay | Admitting: *Deleted

## 2013-01-01 MED ORDER — IPRATROPIUM-ALBUTEROL 20-100 MCG/ACT IN AERS: 1.0000 | INHALATION_SPRAY | Freq: Four times a day (QID) | RESPIRATORY_TRACT | Status: DC | PRN

## 2013-01-21 ENCOUNTER — Emergency Department (HOSPITAL_COMMUNITY)
Admission: EM | Admit: 2013-01-21 | Discharge: 2013-01-21 | Disposition: A | Discharge status: Home / Self Care | Payer: BC Managed Care – PPO | Attending: Emergency Medicine | Admitting: Emergency Medicine

## 2013-01-21 DIAGNOSIS — Z79899 Other long term (current) drug therapy: Secondary | ICD-10-CM | POA: Insufficient documentation

## 2013-01-21 DIAGNOSIS — Z8639 Personal history of other endocrine, nutritional and metabolic disease: Secondary | ICD-10-CM | POA: Insufficient documentation

## 2013-01-21 DIAGNOSIS — Z8619 Personal history of other infectious and parasitic diseases: Secondary | ICD-10-CM | POA: Insufficient documentation

## 2013-01-21 DIAGNOSIS — T82598A Other mechanical complication of other cardiac and vascular devices and implants, initial encounter: Secondary | ICD-10-CM | POA: Insufficient documentation

## 2013-01-21 DIAGNOSIS — Y840 Cardiac catheterization as the cause of abnormal reaction of the patient, or of later complication, without mention of misadventure at the time of the procedure: Secondary | ICD-10-CM | POA: Insufficient documentation

## 2013-01-21 DIAGNOSIS — Z95828 Presence of other vascular implants and grafts: Secondary | ICD-10-CM

## 2013-01-21 DIAGNOSIS — Z8709 Personal history of other diseases of the respiratory system: Secondary | ICD-10-CM | POA: Insufficient documentation

## 2013-01-21 DIAGNOSIS — IMO0002 Reserved for concepts with insufficient information to code with codable children: Secondary | ICD-10-CM | POA: Insufficient documentation

## 2013-01-21 DIAGNOSIS — Z862 Personal history of diseases of the blood and blood-forming organs and certain disorders involving the immune mechanism: Secondary | ICD-10-CM | POA: Insufficient documentation

## 2013-01-21 NOTE — ED Provider Notes (Signed)
CSN: [id number]     Arrival date & time 01/21/13  0045 History   First MD Initiated Contact with Patient 01/21/13 0122     Chief Complaint  Patient presents with  . Air pocket in PICC line     HPI  History provided by the patient. Patient is a 52 year old male with history of cystic fibrosis and recent hospitalization with diagnosis of Pseudomonas lung infection who presents with concerns for small air bubble in his PICC line. Patient was discharged from the hospital yesterday with instructions for home antibiotic use to treat his lung infection. Patient has a left arm PICC line which was placed yesterday. Last night and early this morning he noticed a small air bubble in the tubing and was concerned that this may cause a problem prior to his administering antibiotic dose. He denies any other complaints or changes in symptoms. Denies any shortness of breath. No redness or swelling of the arm.    Past Medical History  Diagnosis Date  . Cystic fibrosis   . Shortness of breath    Past Surgical History  Procedure Laterality Date  . Appendectomy    . Tonsillectomy    . Hernia repair    . Nasal polyp excision     Family History  Problem Relation Age of Onset  . Lung cancer      GF  . Heart attack Father 28   History  Substance Use Topics  . Smoking status: Never Smoker   . Smokeless tobacco: Never Used  . Alcohol Use: No    Review of Systems  All other systems reviewed and are negative.    Allergies  Review of patient's allergies indicates no known allergies.  Home Medications   Current Outpatient Rx  Name  Route  Sig  Dispense  Refill  . albuterol (PROVENTIL HFA;VENTOLIN HFA) 108 (90 BASE) MCG/ACT inhaler   Inhalation   Inhale 2 puffs into the lungs every 6 (six) hours as needed.   1 Inhaler   11   . budesonide (PULMICORT FLEXHALER) 180 MCG/ACT inhaler      INHALE 4 PUFFS INTO THE LUNGS TWICE DAILY   6 Inhaler   1   . dextrose 5 % SOLN 50 mL with cefTAZidime  2 G SOLR 2 g   Intravenous   Inject 2 g into the vein once. Patient had medication to be used when he has any kind of infection         . fluticasone (FLONASE) 50 MCG/ACT nasal spray      2 sprays in each nostril two times daily   64 g   4   . Hypertonic Nasal Wash (SINUS RINSE REFILL) PACK      Use 2 packets three times a day   540 each   0   . Ipratropium-Albuterol (COMBIVENT RESPIMAT) 20-100 MCG/ACT AERS respimat   Inhalation   Inhale 1 puff into the lungs 4 (four) times daily as needed for wheezing.   3 Inhaler   1   . NONFORMULARY OR COMPOUNDED ITEM      Neilmed Isotonic Saline packets   100 each   11   . NONFORMULARY OR COMPOUNDED ITEM      Neilmed Sinus Refill Kit with bottle   1 each   11    BP 123/81  Pulse 75  Temp(Src) 98.1 F (36.7 C) (Oral)  Resp 16  SpO2 98% Physical Exam  Nursing note and vitals reviewed. Constitutional: He is oriented to person,  place, and time. He appears well-developed and well-nourished. No distress.  HENT:  Head: Normocephalic.  Cardiovascular: Normal rate and regular rhythm.   Pulmonary/Chest: Effort normal and breath sounds normal.  Musculoskeletal:  PICC line in the left upper arm. No redness, swelling or pain.  Neurological: He is alert and oriented to person, place, and time.    ED Course  Procedures   DIAGNOSTIC STUDIES: Oxygen Saturation is 98% on room air.  COORDINATION OF CARE:  Nursing notes reviewed. Vital signs reviewed. Initial pt interview and examination performed.   1:33 AM-patient seen and evaluated. PICC line was removed by nurse and flushed. Air bubble removed. Patient otherwise without any concerns or complaints. This time ready to return home.   MDM   1. Status post PICC central line placement        Angus Seller, PA-C 01/21/13 4782

## 2013-01-21 NOTE — ED Provider Notes (Signed)
Medical screening examination/treatment/procedure(s) were performed by non-physician practitioner and as supervising physician I was immediately available for consultation/collaboration.     Sunnie Nielsen, MD 01/21/13 [phone]

## 2013-01-21 NOTE — ED Notes (Signed)
Patient is alert and oriented x3.  He was given DC instructions and follow up visit instructions.  Patient gave verbal understanding.  He was DC ambulatory under his own power to home.  V/S stable.  He was not showing any signs of distress on DC 

## 2013-01-22 ENCOUNTER — Encounter (HOSPITAL_COMMUNITY): Payer: Self-pay | Admitting: Emergency Medicine

## 2013-01-22 ENCOUNTER — Emergency Department (HOSPITAL_COMMUNITY)
Admission: EM | Admit: 2013-01-22 | Discharge: 2013-01-22 | Disposition: A | Discharge status: Home / Self Care | Payer: BC Managed Care – PPO | Attending: Emergency Medicine | Admitting: Emergency Medicine

## 2013-01-22 ENCOUNTER — Emergency Department (HOSPITAL_COMMUNITY): Payer: BC Managed Care – PPO

## 2013-01-22 DIAGNOSIS — Y831 Surgical operation with implant of artificial internal device as the cause of abnormal reaction of the patient, or of later complication, without mention of misadventure at the time of the procedure: Secondary | ICD-10-CM | POA: Insufficient documentation

## 2013-01-22 DIAGNOSIS — T82898A Other specified complication of vascular prosthetic devices, implants and grafts, initial encounter: Secondary | ICD-10-CM

## 2013-01-22 DIAGNOSIS — Z862 Personal history of diseases of the blood and blood-forming organs and certain disorders involving the immune mechanism: Secondary | ICD-10-CM | POA: Insufficient documentation

## 2013-01-22 DIAGNOSIS — T82598A Other mechanical complication of other cardiac and vascular devices and implants, initial encounter: Secondary | ICD-10-CM | POA: Insufficient documentation

## 2013-01-22 DIAGNOSIS — Z8619 Personal history of other infectious and parasitic diseases: Secondary | ICD-10-CM | POA: Insufficient documentation

## 2013-01-22 DIAGNOSIS — Z8639 Personal history of other endocrine, nutritional and metabolic disease: Secondary | ICD-10-CM | POA: Insufficient documentation

## 2013-01-22 DIAGNOSIS — Z79899 Other long term (current) drug therapy: Secondary | ICD-10-CM | POA: Insufficient documentation

## 2013-01-22 DIAGNOSIS — IMO0002 Reserved for concepts with insufficient information to code with codable children: Secondary | ICD-10-CM | POA: Insufficient documentation

## 2013-01-22 MED ORDER — ALTEPLASE 2 MG IJ SOLR
2.0000 mg | Freq: Once | INTRAMUSCULAR | Status: AC
  Administered 2013-01-22: 2 mg
  Filled 2013-01-22: qty 2

## 2013-01-22 MED ORDER — SODIUM CHLORIDE 0.9 % IJ SOLN
10.0000 mL | Freq: Two times a day (BID) | INTRAMUSCULAR | Status: DC
  Administered 2013-01-22: 10 mL

## 2013-01-22 MED ORDER — SODIUM CHLORIDE 0.9 % IJ SOLN: 10.0000 mL | INTRAMUSCULAR | Status: DC | PRN

## 2013-01-22 NOTE — Progress Notes (Signed)
   CARE MANAGEMENT ED NOTE 01/22/2013  Patient:  Jesus Cummings, Jesus Cummings   Account Number:  [id number]  Date Initiated:  01/22/2013  Documentation initiated by:  Radford Pax  Subjective/Objective Assessment:   Patient presenst to ED with problems with left upper arm PICC line.     Subjective/Objective Assessment Detail:   Patient currently receiving IV antibiotic therapy at home for a pseudomonas infection.     Action/Plan:   Action/Plan Detail:   Anticipated DC Date:       Status Recommendation to Physician:   Result of Recommendation:    Other ED Services  Consult Working Plan    DC Planning Services  Other  PCP issues    Choice offered to / List presented to:            Status of service:  Completed, signed off  ED Comments:   ED Comments Detail:  Patient confirms he does not have a pcp.  EDCM instructed patient to go to insurance company website or call the phone number on the back of his insurance card to help him fin a physician who is close to him and within network. Patient verbalized understanding.  Patient currently using the services of [facility] for home IV antibiotic therapy.  Patient reports his pulmonologist Dr. Theresia Bough is working with the home health agency.  No further CM needs at this time.

## 2013-01-22 NOTE — ED Notes (Addendum)
IV team changed dressing and was able to flush IV with normal saline. IV team cleared pt for discharge. Pt states he will give his antibiotic while he arrives home so no heparin was administered.

## 2013-01-22 NOTE — ED Provider Notes (Signed)
CSN: [id number]     Arrival date & time 01/22/13  1804 History   First MD Initiated Contact with Patient 01/22/13 1849     Chief Complaint  Patient presents with  . Vascular Access Problem   (Consider location/radiation/quality/duration/timing/severity/associated sxs/prior Treatment) HPI Comments: 52 year old male with history of cystic fibrosis presents with a problem with his left-sided PICC line. He states that he has a PICC line placed 3 days ago for a Pseudomonas infection is currently on tobramycin and ceftaz. He was given his tobramycin a few hours ago and when the nurse tried to draw a blood level His line became occluded. He is due to have his ceftaz and hour ago. He denies any pain, fevers, swelling, or redness. He otherwise feels well.   Past Medical History  Diagnosis Date  . Cystic fibrosis   . Shortness of breath    Past Surgical History  Procedure Laterality Date  . Appendectomy    . Tonsillectomy    . Hernia repair    . Nasal polyp excision     Family History  Problem Relation Age of Onset  . Lung cancer      GF  . Heart attack Father 79   History  Substance Use Topics  . Smoking status: Never Smoker   . Smokeless tobacco: Never Used  . Alcohol Use: No    Review of Systems  Constitutional: Negative for fever.  Respiratory: Negative for shortness of breath.   Cardiovascular: Negative for chest pain.  Gastrointestinal: Negative for vomiting.  Musculoskeletal: Negative for joint swelling.  Skin: Negative for color change and wound.  Neurological: Negative for weakness.    Allergies  Review of patient's allergies indicates no known allergies.  Home Medications   Current Outpatient Rx  Name  Route  Sig  Dispense  Refill  . albuterol (PROVENTIL HFA;VENTOLIN HFA) 108 (90 BASE) MCG/ACT inhaler   Inhalation   Inhale 2 puffs into the lungs every 6 (six) hours as needed for wheezing or shortness of breath.         . budesonide (PULMICORT) 180 MCG/ACT  inhaler      INHALE 4 PUFFS INTO THE LUNGS TWICE DAILY         . dextrose 5 % SOLN 50 mL with cefTAZidime 2 G SOLR 2 g   Intravenous   Inject 2 g into the vein once. Patient had medication to be used when he has any kind of infection         . fluticasone (FLONASE) 50 MCG/ACT nasal spray      2 sprays in each nostril two times daily   64 g   4   . Hypertonic Nasal Wash (SINUS RINSE REFILL) PACK      Use 2 packets three times a day   540 each   0   . Ipratropium-Albuterol (COMBIVENT) 20-100 MCG/ACT AERS respimat   Inhalation   Inhale 1 puff into the lungs 4 (four) times daily.         . Multiple Vitamin (MULTIVITAMIN WITH MINERALS) TABS tablet   Oral   Take 1 tablet by mouth daily.         . NONFORMULARY OR COMPOUNDED ITEM      Neilmed Isotonic Saline packets   100 each   11   . NONFORMULARY OR COMPOUNDED ITEM      Neilmed Sinus Refill Kit with bottle   1 each   11    BP 123/78  Pulse 92  Temp(Src)  98.3 F (36.8 C) (Oral)  Resp 20  SpO2 96% Physical Exam  Nursing note and vitals reviewed. Constitutional: He is oriented to person, place, and time. He appears well-developed and well-nourished. No distress.  HENT:  Head: Normocephalic and atraumatic.  Right Ear: External ear normal.  Left Ear: External ear normal.  Nose: Nose normal.  Eyes: Right eye exhibits no discharge. Left eye exhibits no discharge.  Neck: Neck supple.  Cardiovascular: Normal rate, regular rhythm, normal heart sounds and intact distal pulses.   Pulmonary/Chest: Effort normal.  Abdominal: Soft. He exhibits no distension.  Musculoskeletal: He exhibits no edema.       Arms: Neurological: He is alert and oriented to person, place, and time.  Skin: Skin is warm and dry.    ED Course  Procedures (including critical care time) Labs Review Labs Reviewed - No data to display Imaging Review Dg Chest 2 View  01/22/2013   CLINICAL DATA:  Non functional/clogged PICC line which was  inserted 3 days ago  EXAM: CHEST  2 VIEW  COMPARISON:  01/22/2011  FINDINGS: Left arm PICC line tip projects over SVC above cavoatrial junction.  Mild enlargement of cardiac silhouette.  Mediastinal contours and pulmonary vascularity normal.  Bibasilar atelectasis greater on right.  No acute infiltrate, pleural effusion, or pneumothorax.  No acute osseous findings.  IMPRESSION: Mild enlargement of cardiac silhouette.  Bibasilar atelectasis greater on right.   Electronically Signed   By: Ulyses Southward M.D.   On: 01/22/2013 20:05    EKG Interpretation   None       MDM   1. Occluded PICC line, initial encounter    No signs of infection. IV team consulted, they placed tpa into catheter after confirming with Xray. After this the PICC flushed well and he is stable for discharge.    Audree Camel, MD 01/22/13 [phone]

## 2013-01-22 NOTE — ED Notes (Addendum)
Pt presents with complaint of a clot in his PICC line. Pt states his home health nurse had administered his medications, however the nurse was unable to flush the catheter with saline or heparin. Pt reports this was thirty minutes prior to arrival. Pt is A/O x4, in NAD, and vital signs are WDL. IV team paged to assess PICC upon patient presentation to department, awaiting response.

## 2013-03-20 ENCOUNTER — Telehealth: Payer: Self-pay | Admitting: Internal Medicine

## 2013-03-20 MED ORDER — BUDESONIDE 180 MCG/ACT IN AEPB: INHALATION_SPRAY | RESPIRATORY_TRACT | Status: DC

## 2013-03-20 NOTE — Telephone Encounter (Signed)
Last OV 08/05/12. Pulmicort refill sent. I spoke with pt contact and she states the pt has been in and out of the hospital in [place] 3 times over the last 3 months for breathing issues and would like to see CY to discuss this. Pt needs a late afternoon appt after 3pm. Please advise on available appt. Carron CurieJennifer Castillo, CMA

## 2013-03-20 NOTE — Telephone Encounter (Signed)
Lets have patient come on Friday 03-27-13 at 4pm for 4:15pm appt with CY. Thanks.

## 2013-03-20 NOTE — Telephone Encounter (Signed)
Spoke with Jesus Cummings will be here at 4pm on 03-27-13.

## 2013-03-27 ENCOUNTER — Ambulatory Visit (INDEPENDENT_AMBULATORY_CARE_PROVIDER_SITE_OTHER): Payer: BC Managed Care – PPO | Admitting: Internal Medicine

## 2013-03-27 ENCOUNTER — Encounter: Payer: Self-pay | Admitting: Internal Medicine

## 2013-03-27 DIAGNOSIS — J42 Unspecified chronic bronchitis: Secondary | ICD-10-CM

## 2013-03-27 MED ORDER — FLUTICASONE PROPIONATE 50 MCG/ACT NA SUSP: NASAL | Status: DC

## 2013-03-27 MED ORDER — BUDESONIDE 180 MCG/ACT IN AEPB: INHALATION_SPRAY | RESPIRATORY_TRACT | Status: DC

## 2013-03-27 MED ORDER — TOBRAMYCIN 300 MG/5ML IN NEBU: 300.0000 mg | INHALATION_SOLUTION | Freq: Two times a day (BID) | RESPIRATORY_TRACT | Status: AC

## 2013-03-27 MED ORDER — DORNASE ALFA 2.5 MG/2.5ML IN SOLN: 2.5000 mg | Freq: Every day | RESPIRATORY_TRACT | Status: DC

## 2013-03-27 MED ORDER — IPRATROPIUM-ALBUTEROL 20-100 MCG/ACT IN AERS: 1.0000 | INHALATION_SPRAY | Freq: Four times a day (QID) | RESPIRATORY_TRACT | Status: DC

## 2013-03-27 NOTE — Assessment & Plan Note (Signed)
Plan-continue current treatment

## 2013-03-27 NOTE — Progress Notes (Signed)
Patient ID: Jesus AbernethyMichael J Cummings, male    DOB: Oct 22, 1960, 53 y.o.   MRN: [id number]  HPI 06/26/10- [age] yoM never smoker followed for chronic sinusitis and a CF-variant with chronic bronchitis, complicated by hx pulmonary hypertension.  Last here January 10, 2010. He has been getting his aerosol Tobi recently through a face mask to deliver it to nose and lower airway. He wants to try that for his pulmozyme as well. We discussed salinity of aerosols and airway irritability.  Swimming keeps his airways open, otherwise cough gets productive. He does use Flutter and VEST for pulmonary toilet. Denies change in exercise tolerance over time. Cipro has been his best fall-back drug. We talked about newer drugs. Worst time seems to be Fall and Spring. Most likely it is virus infections rather than seasonal allergy at those times.  12/03/10- 49 yoM never smoker followed for chronic sinusitis and a CF-variant with chronic bronchitis, complicated by hx pulmonary hypertension. ...wife here For at least the last month he reports wheezing too much which he calls asthma. Feels a coating in his airways, but cough is dry. Scant mucus from nose and chest. We had called in Levaquin but he says that never works well but did help some as he finishes his second week. Nasal discharge is still yellow with nose blowing and postnasal drip. We discussed his previous experience with several antibiotics and he remembers SwedenFactiv working well. 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 Because he was not particularly symptomatic, we chose to wait on treatment in hopes other flora would take over. He is still swimming but feels hot and cold with muscle aches after swimming. Wife has tried to take his temperature and suspects low grade fevers. He is taking more naps. He feels his chest stays clear only about a week after each antibiotic. 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  01/22/11- 49 yoM never smoker followed for chronic sinusitis and a CF-variant with chronic bronchitis, complicated by hx pulmonary hypertension. ...wife here Had flu vaccine. Has been working with Dr Comer/ ID. 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 AFB smear and culture were negative.  This weekend his chest felt tight her with deep breath and he was more dyspneic. No fever or sweat. Today he feels much improved and went for a good walk yesterday although he still doesn't feel well enough to swimming. Chest x-ray 12/04/2010 showed scarring with no active process. He describes getting extremely smothered trying to lie supine long enough to have the PICC line placed. This is unusual for him. His feet have not been swelling. He has no residual chest discomfort other than his chronic cough. He has been using a Estate agentVest percusor, Flutter, and wife does chest PT. we discussed using an incentive spirometer to help him assess how deep of breath he is taking.  07/05/11- 49 yoM never smoker followed for chronic sinusitis and a CF-variant with chronic bronchitis, complicated by hx pulmonary hypertension. ...wife here  "doing pretty good with breathing"; denies SOB, or wheezing, no cough. Still having the usual chest congestion. H. stop his Daliresp as "not needed". He continues to swim regularly for exercise. After [facility] he had evaluation [facility]Pedar No[facility]. He was hospitalized there  for 2 days with intravenous tobramycin and ceftaz been home for 3 weeks with those medications by PICC line. 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 He also went to ENT at [facility] and they're planning to see him 4 times per year for "sinus cleaning". A  different kind of pulmonary toilet device was suggested. He tried a VEST for pulmonary toilet but has not been using it routinely. He continues nebulized tobramycin 3 weeks on and 3 weeks off.  08/05/12- 51 yoM never smoker followed for chronic sinusitis and a CF-variant with chronic bronchitis, complicated by hx pulmonary hypertension. FOLLOWS FOR: ? start of an infection; past few days had trouble breathing due to humidity. [facility] doubts CF- favors Immotile Cilia.recent thick yellow white mucus. Daily Flutter, swim, continues 3 weeks on and 3 weeks off Tobi neb, does Percussion Vest, Neb Pulmozyme/ Dornase. Never could get nebulized Mucomyst.  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  Wife here. FOLLOWS FOR:  Hospital follow up [facility] [place] for PNA-Pt reports doing better Had flu dating to pneumonia and hospitalized. Treated IV meropenem/ Tobramycin. Now feels well compared to his baseline with daily cough producing scant white sputum. He is resuming his maintenance treatment including TOBI neb 21 days on/ 21 days off, daily pulmozyme neb, daily hypertonic saline neb., Pulmicort, Combivent, Flonase and swimming.  CXR 01/22/13 IMPRESSION:  Mild enlargement of cardiac silhouette.  Bibasilar atelectasis greater on right.  Electronically Signed  By: Ulyses Southward M.D.  On: 01/22/2013 20:05   Review of Systems- see HPI Constitutional:   No-   weight loss, night sweats,  +fevers, chills, fatigue, lassitude. HEENT:   No-  headaches, difficulty swallowing, tooth/dental problems, sore throat,       No-  sneezing, itching, ear ache,  +nasal congestion, post nasal drip,  CV:  No-   chest pain, orthopnea, PND, swelling in lower extremities, anasarca, dizziness, palpitations Resp: +  shortness of breath with exertion or at rest.              + productive cough,  +  non-productive cough,  No- coughing up of blood.              No- change in color of mucus.  No- wheezing.   Skin: No-   rash or lesions. GI:  No-   heartburn, indigestion, abdominal pain, nausea, vomiting, GU: ain. MS:  No-   joint pain or swelling.  . Neuro-     nothing unusual Psych:  No- change in mood or affect. No depression or anxiety.  No memory loss.   Objective:   Physical Exam General- Alert, Oriented, Affect-appropriate, Distress- none acute Skin- rash-none, lesions- none, excoriation- none.  Lymphadenopathy- none Head- atraumatic            Eyes- Gross vision intact, PERRLA, conjunctivae clear secretions            Ears- Hearing, canals-normal            Nose- Clear, no-Septal dev, mucus, polyps, erosion, perforation             Throat- Mallampati II , mucosa clear , drainage- none, tonsils- atrophic, + hoarse Neck- flexible , trachea midline, no stridor , thyroid nl, carotid no bruit Chest - symmetrical excursion , unlabored  Heart/CV- RRR , no murmur , no gallop  , no rub, nl s1 s2                           - JVD- none , edema- none, stasis changes- none, varices- none           Lung- clear, wheeze-none, cough- none , dullness-none, rub- none. Unlabored           Chest wall-  Abd-  Br/ Gen/ Rectal- Not done, not indicated Extrem- cyanosis- none, clubbing, none, atrophy- none, strength- nl Neuro- grossly intact to observation

## 2013-03-27 NOTE — Patient Instructions (Addendum)
We will enter [facility] as your mail order pharmacy then send 3 month scripts for pulmicort, combivent and flonase  Please call as needed  Order- Schedule PFT and 6 MWT   Dx chronic bronchitis

## 2013-04-03 ENCOUNTER — Telehealth: Payer: Self-pay | Admitting: Internal Medicine

## 2013-04-03 MED ORDER — BUDESONIDE 180 MCG/ACT IN AEPB: INHALATION_SPRAY | RESPIRATORY_TRACT | Status: DC

## 2013-04-03 MED ORDER — IPRATROPIUM-ALBUTEROL 20-100 MCG/ACT IN AERS: 1.0000 | INHALATION_SPRAY | Freq: Four times a day (QID) | RESPIRATORY_TRACT | Status: DC

## 2013-04-03 MED ORDER — FLUTICASONE PROPIONATE 50 MCG/ACT NA SUSP: NASAL | Status: DC

## 2013-04-03 MED ORDER — IPRATROPIUM-ALBUTEROL 20-100 MCG/ACT IN AERS: 1.0000 | INHALATION_SPRAY | Freq: Four times a day (QID) | RESPIRATORY_TRACT | Status: AC

## 2013-04-03 NOTE — Telephone Encounter (Signed)
Rx's have been sent in. They were previously sent in for the wrong quantities. Pt's spouse is aware that these have been fixed. Nothing further is needed.

## 2013-04-16 ENCOUNTER — Telehealth: Payer: Self-pay | Admitting: Internal Medicine

## 2013-04-16 NOTE — Telephone Encounter (Signed)
Pt is still having issues with Pulmicort and Flonase prescriptions. Florentina AddisonKatie has forms that have been faxed from his pharmacy.  Will route message to her per her request.

## 2013-04-17 NOTE — Telephone Encounter (Signed)
Spoke with [facility]- the Rx's needed clarification for 90 day supplies. I gave the verbal clarification to pharmacist-nothing more needed..  Pulmicort inhalers= #6  Flonase nasal spray=#3   No PA's were needed; Please let patient know that this has been corrected. Thanks.

## 2013-04-17 NOTE — Telephone Encounter (Signed)
[name] advised. Carron CurieJennifer Arin Peral, CMA

## 2013-04-27 ENCOUNTER — Telehealth: Payer: Self-pay | Admitting: Internal Medicine

## 2013-04-27 NOTE — Telephone Encounter (Signed)
lmomtcb x1 for Jesus Cummings 

## 2013-04-28 NOTE — Telephone Encounter (Signed)
lmtcb x2 for Jesus Cummings

## 2013-04-29 MED ORDER — BUDESONIDE 180 MCG/ACT IN AEPB: INHALATION_SPRAY | RESPIRATORY_TRACT | Status: DC

## 2013-04-29 MED ORDER — FLUTICASONE PROPIONATE 50 MCG/ACT NA SUSP: NASAL | Status: AC

## 2013-04-29 NOTE — Telephone Encounter (Signed)
Called and spoke with [name]. She reports [facility] is stating they have been faxing us regarding pt pulmicort and flonase. She reports pt is going through 2 boxes of each medication a month d/t directions. So he needs double the medication for 90 day supply. I have sent in new RX's for these 2 medicaitons. I called [facility] and was advised the flonase needs PA. I needed to call another # [phone] to initiate. I spoke with Trey PaulaJeff. Was advised this will have to go into review. We should receive notice in the next 72 hrs. Case #: [id number]. Will forward to Katie to f/u on.

## 2013-05-05 ENCOUNTER — Telehealth: Payer: Self-pay | Admitting: Internal Medicine

## 2013-05-05 NOTE — Telephone Encounter (Signed)
Pt sees Dr. Maple HudsonYoung for breathing.  Called, spoke with pt's wife.  Reports pt has a rash all over his body.  He has lots of red bumps that are the size of a pencil eraser to a dime. Bumps are itchy and do "come to a head" but then pt scratches them.  Pt's had rash for years but has worsened in the past month.  In the past, he has used creams with some relief.  One being desonide but is currently out.  States pt has been on lots of abx recent.  He also was swimming regularly until recently when being sick.  Rash still present even with no swimming recently.  Wife would like to know if CY thinks this rash could be allergy related or if pt should go to a dermatology for evaluation.  Wife thinks pt had skin test done here "years ago."  Dr. Maple HudsonYoung, pls advise if pt should be placed on your schedule for an allergy consult or be seen by dermatology.  Thank you.

## 2013-05-05 NOTE — Telephone Encounter (Signed)
I suggest he see dermatologist for this rash.

## 2013-05-05 NOTE — Telephone Encounter (Signed)
Called made karen aware. Nothing further needed

## 2013-05-18 NOTE — Telephone Encounter (Signed)
Katie, please advise on status so we can close encounter thanks!

## 2013-05-22 NOTE — Telephone Encounter (Signed)
Spoke with rep at [employer] as I have not received any approval or denial for patient on Flonase or Pulmicort. Rep states that the Pulmicort has been picked up by patient and no PA needed-was picked up 9 days ago. Flonase Rx has been fixed and nothing is needed. Insurance will cover for 3 bottles a month. Nothing else is needed.

## 2013-06-23 ENCOUNTER — Telehealth: Payer: Self-pay | Admitting: Internal Medicine

## 2013-06-23 NOTE — Telephone Encounter (Signed)
Filled out forms for PA faxed to us from [facility] for fluticason 50 mcg 2 sprays bid. This form has been placed on CY's chart to sign and answer question 1-4. I have sent / routed this Katie to follow up on once form has been completed.

## 2013-06-23 NOTE — Telephone Encounter (Signed)
I have faxed forms back to insurance-will await a decision from insurance company.

## 2013-06-24 ENCOUNTER — Telehealth: Payer: Self-pay | Admitting: Internal Medicine

## 2013-06-24 MED ORDER — BUDESONIDE 180 MCG/ACT IN AEPB: INHALATION_SPRAY | RESPIRATORY_TRACT | Status: DC

## 2013-06-24 NOTE — Telephone Encounter (Signed)
In March we sent in RX for 6 inhalers. I called Jesus Cummings and made her aware. She reports they received this for only a 30 day. She will need another RX sent in. I have done soNothing further needed

## 2013-06-26 ENCOUNTER — Telehealth: Payer: Self-pay | Admitting: Internal Medicine

## 2013-06-26 NOTE — Telephone Encounter (Signed)
I called spoke with [name]. Confirmed pt admitted since Wed night. Plan to d/c Monday. FYI for CDY

## 2013-06-26 NOTE — Telephone Encounter (Signed)
Noted  

## 2013-07-01 ENCOUNTER — Telehealth: Payer: Self-pay | Admitting: Internal Medicine

## 2013-07-01 NOTE — Telephone Encounter (Signed)
Spoke with Jesus Cummings with [facility] PA was initiated and never completed--nothing documented in chart of initiation. After 72 hours PA was cancelled/denied d/t not being complete. Needing an appeal done for approval -- forms being faxed to triage to complete.  Will hold in triage until forms received and appeal can be completed.

## 2013-07-02 NOTE — Telephone Encounter (Signed)
See phone note dated 07/01/13. Jesus CurieJennifer Kellie Murrill, CMA

## 2013-07-02 NOTE — Telephone Encounter (Signed)
Appeal form received and completed. I have placed this on CY cart to sign. I will forward to [facility] to follow-up on. Carron CurieJennifer Castillo, CMA

## 2013-07-03 NOTE — Telephone Encounter (Signed)
CY signed the appeal form and I have faxed the papers back to [phone]. Will await a decision from [employer].

## 2013-07-03 NOTE — Telephone Encounter (Signed)
CY, Please advise. Thanks.

## 2013-07-14 NOTE — Telephone Encounter (Signed)
Per CY-This has been denied and patient will need to buy OTC now. Thanks.

## 2013-07-14 NOTE — Telephone Encounter (Signed)
ATC home #. Line rang several times and no answer and VM Called mobile and LMTCB x1

## 2013-07-15 NOTE — Telephone Encounter (Signed)
Called home #. NA and no VM Called cell and LMTCB x2

## 2013-07-16 ENCOUNTER — Encounter: Payer: Self-pay | Admitting: Internal Medicine

## 2013-07-17 NOTE — Telephone Encounter (Signed)
Called pt's home # - no answer and no option to LM. ATC pt's cell # - voicemail box is full; unable to LM.

## 2013-07-20 NOTE — Telephone Encounter (Signed)
ATC home #-no answer, no voicemail. Carron Curie, CMA

## 2013-07-21 NOTE — Telephone Encounter (Signed)
I called cell- someone picked up, but did not speak  Called home number, NA and no option to leave msg  Will close encounter since multiple attempts to reach pt not successful

## 2013-07-21 NOTE — Telephone Encounter (Signed)
Spouse called back. Aware of the below. Nothing further needed

## 2013-07-29 ENCOUNTER — Telehealth: Payer: Self-pay | Admitting: Internal Medicine

## 2013-07-29 NOTE — Telephone Encounter (Signed)
I called Jesus Cummings and she will re fax this to triage fax

## 2013-07-29 NOTE — Telephone Encounter (Signed)
Called spoke with HenlawsonSimone from [facility]. She reports they sent over 2nd level appeal fax to triage yesterday regarding pt flonase. She wants to know if this was received. Please advise Florentina AddisonKatie thanks

## 2013-07-29 NOTE — Telephone Encounter (Signed)
Sorry, I have not gotten any papers on Mr. Vonita MossPeterson.

## 2013-07-30 ENCOUNTER — Encounter: Payer: Self-pay | Admitting: Internal Medicine

## 2013-07-30 NOTE — Telephone Encounter (Signed)
Form received and placed on CY cart to complete. Carron CurieJennifer Castillo, CMA

## 2013-07-30 NOTE — Telephone Encounter (Signed)
Letter written and attached to appeal form. They want copy of "denial letter" and our last ov note.

## 2013-07-31 NOTE — Telephone Encounter (Signed)
Please check with Meghan for this information as I am in PFT today. Thanks.

## 2013-07-31 NOTE — Telephone Encounter (Signed)
Jesus Cummings was this giving to you? thanks

## 2013-08-03 NOTE — Telephone Encounter (Signed)
I have called Simone at # provided to see if she can fax us a copy of the denial letter as we have not received this.   Once this is received, we can fax the Prior Auth Appeal Form, [facility], last [facility] note, and denial letter back to [facility].

## 2013-08-05 ENCOUNTER — Ambulatory Visit: Payer: BC Managed Care – PPO | Admitting: Internal Medicine

## 2013-08-06 NOTE — Telephone Encounter (Signed)
Call Randa EvensSimone again at # provided and left message to fax denial letter to go with appeal and letter written by CY. Still haven't received denial letter. Per CY, fax appeal and letter he wrote. This has been faxed [phone].

## 2013-08-07 NOTE — Telephone Encounter (Signed)
Have since received denial letter. Denial letter, appeal and letter from [facility] have been re-faxed to [phone]. Nothing further needed at this time, awaiting final determination.

## 2013-08-18 NOTE — Telephone Encounter (Signed)
Please let patient know that he has been denied again for Flonase Rx-they will only cover for 3 bottles in 3 month time frame; he will need to get additional Rx OTC and use.

## 2013-08-18 NOTE — Telephone Encounter (Signed)
ATC pt at home, line rang multiple times with no answer and no option to LM.  WCB.

## 2013-08-19 NOTE — Telephone Encounter (Signed)
Called mobile # for spouse Spoke with spouse. She reports she spoke with mail order pharm [facility] yesterday. She was told if we did a 3rd appeal it will normally get approved. Please advise Dr. Maple HudsonYoung thanks

## 2013-08-19 NOTE — Telephone Encounter (Signed)
ACT PT and spouse. Line rang several times. No VM and and no answer

## 2013-09-09 ENCOUNTER — Telehealth: Payer: Self-pay | Admitting: Internal Medicine

## 2013-09-09 NOTE — Telephone Encounter (Signed)
LM x 1 for Onalee HuaDavid to return call

## 2013-09-09 NOTE — Telephone Encounter (Signed)
lmtcb X1 with Onalee Huaavid re: PA

## 2013-09-09 NOTE — Telephone Encounter (Signed)
Spoke with Jesus StableDavid-he is aware that CY has stated previously that we will not do 3rd level appeal for patients Flonase-patient can get what amount insurance will allow him for 90 day supply and then pay out of pocket for the additional amounts needed. Onalee HuaDavid placed this information on file. Nothing more needed at this time.

## 2013-09-09 NOTE — Telephone Encounter (Signed)
Would like to make sure this paperwork was received.  States this is a 3rd tier letter sent on 7/27.  Would like status update.  Jesus FairyHolly D Pryor

## 2013-09-24 ENCOUNTER — Encounter (INDEPENDENT_AMBULATORY_CARE_PROVIDER_SITE_OTHER): Payer: Self-pay

## 2013-09-24 ENCOUNTER — Encounter: Payer: Self-pay | Admitting: Internal Medicine

## 2013-09-24 ENCOUNTER — Ambulatory Visit (INDEPENDENT_AMBULATORY_CARE_PROVIDER_SITE_OTHER): Payer: BC Managed Care – PPO | Admitting: Internal Medicine

## 2013-09-24 VITALS — BP 118/74 | HR 83 | Ht 68.25 in | Wt 196.8 lb

## 2013-09-24 DIAGNOSIS — Z23 Encounter for immunization: Secondary | ICD-10-CM

## 2013-09-24 NOTE — Progress Notes (Signed)
Patient ID: Jesus Cummings, male    DOB: Oct 22, 1960, 53 y.o.   MRN: [id number]  HPI 06/26/10- [age] yoM never smoker followed for chronic sinusitis and a CF-variant with chronic bronchitis, complicated by hx pulmonary hypertension.  Last here January 10, 2010. He has been getting his aerosol Tobi recently through a face mask to deliver it to nose and lower airway. He wants to try that for his pulmozyme as well. We discussed salinity of aerosols and airway irritability.  Swimming keeps his airways open, otherwise cough gets productive. He does use Flutter and VEST for pulmonary toilet. Denies change in exercise tolerance over time. Cipro has been his best fall-back drug. We talked about newer drugs. Worst time seems to be Fall and Spring. Most likely it is virus infections rather than seasonal allergy at those times.  12/03/10- 49 yoM never smoker followed for chronic sinusitis and a CF-variant with chronic bronchitis, complicated by hx pulmonary hypertension. ...wife here For at least the last month he reports wheezing too much which he calls asthma. Feels a coating in his airways, but cough is dry. Scant mucus from nose and chest. We had called in Levaquin but he says that never works well but did help some as he finishes his second week. Nasal discharge is still yellow with nose blowing and postnasal drip. We discussed his previous experience with several antibiotics and he remembers SwedenFactiv working well. 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 Because he was not particularly symptomatic, we chose to wait on treatment in hopes other flora would take over. He is still swimming but feels hot and cold with muscle aches after swimming. Wife has tried to take his temperature and suspects low grade fevers. He is taking more naps. He feels his chest stays clear only about a week after each antibiotic. 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  01/22/11- 49 yoM never smoker followed for chronic sinusitis and a CF-variant with chronic bronchitis, complicated by hx pulmonary hypertension. ...wife here Had flu vaccine. Has been working with Dr Comer/ ID. 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 AFB smear and culture were negative.  This weekend his chest felt tight her with deep breath and he was more dyspneic. No fever or sweat. Today he feels much improved and went for a good walk yesterday although he still doesn't feel well enough to swimming. Chest x-ray 12/04/2010 showed scarring with no active process. He describes getting extremely smothered trying to lie supine long enough to have the PICC line placed. This is unusual for him. His feet have not been swelling. He has no residual chest discomfort other than his chronic cough. He has been using a Estate agentVest percusor, Flutter, and wife does chest PT. we discussed using an incentive spirometer to help him assess how deep of breath he is taking.  07/05/11- 49 yoM never smoker followed for chronic sinusitis and a CF-variant with chronic bronchitis, complicated by hx pulmonary hypertension. ...wife here  "doing pretty good with breathing"; denies SOB, or wheezing, no cough. Still having the usual chest congestion. H. stop his Daliresp as "not needed". He continues to swim regularly for exercise. After [facility]productive [facility] he had evaluation by Dr Nils FlackPedar Noone [facility]. He was hospitalized there  for 2 days with intravenous tobramycin and ceftaz been home for 3 weeks with those medications by PICC line. 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 [facility] also [facility] and they're planning to see him 4 times per year for "sinus cleaning". A  different kind of pulmonary toilet device was suggested. He tried a VEST for pulmonary toilet but has not been using it routinely. He continues nebulized tobramycin 3 weeks on and 3 weeks off.  08/05/12- 51 yoM never smoker followed for chronic sinusitis , Primary Ciliary Dyskinesia, chronic bronchitis/ bronchiectasis, complicated by hx pulmonary hypertension. FOLLOWS FOR: ? start of an infection; past few days had trouble breathing due to humidity. [facility] doubts CF- favors Immotile Cilia.recent thick yellow white mucus. Daily Flutter, swim, continues 3 weeks on and 3 weeks off Tobi neb, does Percussion Vest, Neb Pulmozyme/ Dornase. Never could get nebulized Mucomyst.  09/24/13- 52 yoM never smoker followed for chronic sinusitis and a CF-variant with chronic bronchitis, complicated by hx pulmonary hypertension. FOLLOWS FOR: [facility]; had pseudomonal pneumonia. Discharged on O2 Sleep.     Review of Systems- see HPI Constitutional:   No-   weight loss, night sweats,  +fevers, chills, fatigue, lassitude. HEENT:   No-  headaches, difficulty swallowing, tooth/dental problems, sore throat,       No-  sneezing, itching, ear ache,  +nasal congestion, post nasal drip,  CV:  No-   chest pain, orthopnea, PND, swelling in lower extremities, anasarca, dizziness, palpitations Resp: +  shortness of breath with exertion or at rest.              + productive cough,  + non-productive cough,  No- coughing up of blood.              + change in color of mucus.  No- wheezing.   Skin: No-   rash or lesions. GI:  No-   heartburn, indigestion, abdominal pain, nausea, vomiting, GU: ain. MS:  No-   joint pain or swelling.  . Neuro-     nothing unusual Psych:  No- change in mood or affect. No depression or anxiety.  No memory loss.   Objective:   Physical Exam General- Alert, Oriented, Affect-appropriate, Distress- none acute Skin- rash-none, lesions- none, excoriation-  none Lymphadenopathy- none Head- atraumatic            Eyes- Gross vision intact, PERRLA, conjunctivae clear secretions            Ears- Hearing, canals-normal            Nose- Clear, no-Septal dev, mucus, polyps, erosion, perforation             Throat- Mallampati II , mucosa clear , drainage- none, tonsils- atrophic, + hoarse Neck- flexible , trachea midline, no stridor , thyroid nl, carotid no bruit Chest - symmetrical excursion , unlabored           Heart/CV- RRR , no murmur , no gallop  , no rub, nl s1 s2                           - JVD- none , edema- none, stasis changes- none, varices- none           Lung- no- rhonchi in bases, wheeze-+mild, cough- none , dullness-none, rub- none. Unlabored  Chest wall-  Abd-  Br/ Gen/ Rectal- Not done, not indicated Extrem- cyanosis- none, clubbing, none, atrophy- none, strength- nl Neuro- grossly intact to observation

## 2013-09-24 NOTE — Patient Instructions (Addendum)
Order- Prevnar 13 pneumococcal vaccine  Recommend you get your flu shot late September  [facility] the med[facility] as [facility]  Sample Anoro inhaler 1 puff, once daily. Try this instead of Combivent, for comparison

## 2013-10-16 ENCOUNTER — Telehealth: Payer: Self-pay | Admitting: Internal Medicine

## 2013-10-16 MED ORDER — BUDESONIDE 180 MCG/ACT IN AEPB: INHALATION_SPRAY | RESPIRATORY_TRACT | Status: DC

## 2013-10-16 NOTE — Telephone Encounter (Signed)
Called spoke with pt. Aware RX has been sent to the pharmacy. Nothing further needed

## 2013-10-23 ENCOUNTER — Telehealth: Payer: Self-pay | Admitting: Internal Medicine

## 2013-10-23 MED ORDER — SODIUM CHLORIDE 7 % IN NEBU: 1.0000 | INHALATION_SOLUTION | Freq: Four times a day (QID) | RESPIRATORY_TRACT | Status: AC

## 2013-10-23 NOTE — Telephone Encounter (Signed)
rx sent and pt is aware. Jennifer Castillo, CMA  

## 2013-10-23 NOTE — Telephone Encounter (Signed)
Pt is requesting an rx for sodium chloride 7% neb solution. He normally gets this through a doc at [facility]. He takes it 1 vial four times a day #458ml. Please advise if ok to refill. Carron Curie, CMA Allergies  Allergen Reactions  . Cefepime Hives  . Cephalosporins Hives  . Latex Dermatitis  . Tape Dermatitis  . Chlorhexidine Rash    Blisters

## 2013-10-23 NOTE — Telephone Encounter (Signed)
Ok to order 7%  saline for nebulizer use as requested.

## 2013-11-04 ENCOUNTER — Telehealth: Payer: Self-pay | Admitting: Internal Medicine

## 2013-11-04 MED ORDER — UMECLIDINIUM-VILANTEROL 62.5-25 MCG/INH IN AEPB: 1.0000 | INHALATION_SPRAY | Freq: Every day | RESPIRATORY_TRACT | Status: AC

## 2013-11-04 NOTE — Telephone Encounter (Signed)
Called spouse. Aware RX sent in. Nothing further needed 

## 2013-11-04 NOTE — Telephone Encounter (Signed)
Per 09/24/13 OV: Sample Anoro inhaler 1 puff, once daily. Try this instead of Combivent, for comparison   Called spoke with spouse. She reports the anoro has worked well for him. He likes how it has helped with his breathing as well. She wants 90 day RX sent to [facility] order. Please advise thanks

## 2013-11-04 NOTE — Telephone Encounter (Signed)
Hope insurance covers Ok send Rx 3 months, Anoro inhaler, # 3, ref x 3,   1 puff, once daily

## 2013-11-24 ENCOUNTER — Ambulatory Visit: Payer: BC Managed Care – PPO | Admitting: Internal Medicine

## 2013-11-27 ENCOUNTER — Telehealth (HOSPITAL_COMMUNITY): Payer: Self-pay

## 2013-11-27 NOTE — Telephone Encounter (Signed)
I have called and left a message with [place] to inquire about participation in Pulmonary Rehab. Will send letter in mail and follow up.  

## 2013-11-27 NOTE — Telephone Encounter (Signed)
Mr. Jesus Cummings has declined Pulmonary Rehab due to class schedule conflicting with his work schedule. Patient encouraged to contact us in the future if schedule every changes.

## 2014-01-01 ENCOUNTER — Telehealth: Payer: Self-pay | Admitting: Internal Medicine

## 2014-01-01 NOTE — Telephone Encounter (Signed)
LMTCB

## 2014-01-04 ENCOUNTER — Telehealth: Payer: Self-pay | Admitting: Internal Medicine

## 2014-01-04 NOTE — Telephone Encounter (Signed)
Left message voice mail 12:15. Can try later.

## 2014-01-04 NOTE — Telephone Encounter (Signed)
Pt is calling and wanting to speak with CY---would rather not speak with a nurse in the office.  CY please advise. thanks

## 2014-01-04 NOTE — Telephone Encounter (Signed)
I returned call answered by wife, who said husband wanted to discuss steroid side effects. I called home number she gave- [phone]. Multiple rings, no answer. I called wife back and she said he must be asleep and issue could wait till tomorrow. They will call office to suggest time window when I can call to answer his question.

## 2014-01-13 NOTE — Telephone Encounter (Signed)
I had spoken with wife. He can call back if needed. Close this.

## 2014-01-13 NOTE — Telephone Encounter (Signed)
CY are we able to sign off of this message?  Please advise. thanks

## 2014-03-08 ENCOUNTER — Telehealth: Payer: Self-pay | Admitting: Internal Medicine

## 2014-03-08 NOTE — Telephone Encounter (Signed)
lmtcb X1 for pt  

## 2014-03-09 MED ORDER — AZITHROMYCIN 250 MG PO TABS: 250.0000 mg | ORAL_TABLET | Freq: Every day | ORAL | Status: AC

## 2014-03-09 MED ORDER — DORNASE ALFA 2.5 MG/2.5ML IN SOLN: 2.5000 mg | Freq: Every day | RESPIRATORY_TRACT | Status: DC

## 2014-03-09 NOTE — Addendum Note (Signed)
Addended by: Velvet BatheAULFIELD, ASHLEY L on: 03/09/2014 05:17 PM   Modules accepted: Orders

## 2014-03-09 NOTE — Telephone Encounter (Signed)
meds sent in.  Nothing further needed.

## 2014-03-09 NOTE — Telephone Encounter (Signed)
Ok to resume maintenance zithromax 250 mg, # 30, 1 daily, ref x 5

## 2014-03-09 NOTE — Telephone Encounter (Signed)
Spoke with pt's wife (dpr on file) states pt needs refill on Pulmozyme to mail order company [facility], and also needs refill on azithromycin sent to [facility] [address].  I do not see azithromycin on pt's med list.    Dr. Annamaria Boots do you want patient to take daily azithromycin?  If so, please advise of dosage.  Thank you.   Allergies  Allergen Reactions  . Cefepime Hives  . Cephalosporins Hives  . Latex Dermatitis  . Tape Dermatitis  . Chlorhexidine Rash    Blisters   Current Outpatient Prescriptions on File Prior to Visit  Medication Sig Dispense Refill  . albuterol (PROVENTIL HFA;VENTOLIN HFA) 108 (90 BASE) MCG/ACT inhaler Inhale 2 puffs into the lungs every 6 (six) hours as needed for wheezing or shortness of breath.    . budesonide (PULMICORT) 180 MCG/ACT inhaler INHALE 4 PUFFS INTO THE LUNGS TWICE DAILY 6 each 0  . fluticasone (FLONASE) 50 MCG/ACT nasal spray 2 sprays in each nostril two times daily 96 g 3  . Guaifenesin 1200 MG TB12 Take 1 tablet by mouth daily.    . Hypertonic Nasal Wash (SINUS RINSE REFILL) PACK Use 2 packets three times a day 540 each 0  . Ipratropium-Albuterol (COMBIVENT) 20-100 MCG/ACT AERS respimat Inhale 1 puff into the lungs 4 (four) times daily. 3 Inhaler 3  . Multiple Vitamin (MULTIVITAMIN WITH MINERALS) TABS tablet Take 1 tablet by mouth daily.    . NONFORMULARY OR COMPOUNDED ITEM Neilmed Isotonic Saline packets 100 each 11  . NONFORMULARY OR COMPOUNDED ITEM Neilmed Sinus Refill Kit with bottle 1 each 11  . Sodium Chloride, Inhalant, 7 % NEBU Inhale 1 vial into the lungs 4 (four) times daily. 480 mL 6  . tobramycin, PF, (TOBI) 300 MG/5ML nebulizer solution Take 5 mLs (300 mg total) by nebulization 2 (two) times daily. 21 days on then 21 days off 280 mL 12  . Umeclidinium-Vilanterol 62.5-25 MCG/INH AEPB Inhale 1 puff into the lungs daily. 3 each 3   No current facility-administered medications on file prior to visit.

## 2014-03-09 NOTE — Telephone Encounter (Signed)
Pt returned call [phone]

## 2014-03-16 ENCOUNTER — Telehealth: Payer: Self-pay | Admitting: Internal Medicine

## 2014-03-16 NOTE — Telephone Encounter (Signed)
CY has completed the form,signed, and I have faxed back to [phone]. Will hold in my in basket for approval/denial. Thanks.

## 2014-03-16 NOTE — Telephone Encounter (Signed)
Received fax from [facility] Patient [facility] ID [id number] Fax # to return form : [phone]  Please advise Dr Maple HudsonYoung. Thanks.

## 2014-03-24 MED ORDER — DORNASE ALFA 2.5 MG/2.5ML IN SOLN: 2.5000 mg | Freq: Every day | RESPIRATORY_TRACT | Status: AC

## 2014-03-24 NOTE — Telephone Encounter (Signed)
PA has been approved from 03-16-14 through 03-17-15. Thanks.

## 2014-03-24 NOTE — Telephone Encounter (Signed)
What medication is this for?  I dont know what medicine to send to the pharmacy.

## 2014-03-24 NOTE — Telephone Encounter (Signed)
This has been sent to pharmacy.  Nothing further needed .

## 2014-03-24 NOTE — Telephone Encounter (Signed)
Sorry for the confusion. The medication approved is Pulmozyme sol 1mg /ml. Thanks.

## 2014-04-23 ENCOUNTER — Ambulatory Visit: Payer: Self-pay | Admitting: Internal Medicine

## 2014-04-26 ENCOUNTER — Encounter: Payer: Self-pay | Admitting: Internal Medicine

## 2014-05-13 ENCOUNTER — Ambulatory Visit: Payer: Self-pay | Admitting: Internal Medicine

## 2014-06-10 ENCOUNTER — Ambulatory Visit: Payer: Self-pay | Admitting: Internal Medicine

## 2014-07-30 ENCOUNTER — Telehealth: Payer: Self-pay | Admitting: Internal Medicine

## 2014-07-30 MED ORDER — AMOXICILLIN-POT CLAVULANATE 875-125 MG PO TABS: 1.0000 | ORAL_TABLET | Freq: Two times a day (BID) | ORAL | Status: DC

## 2014-07-30 NOTE — Telephone Encounter (Signed)
Spoke with pt's wife. Informed her of CY response. Augmentin 875mg  1 tab BID #20 sent to pharmacy per CY. Nothing further needed at this time.

## 2014-07-30 NOTE — Telephone Encounter (Signed)
Pt returned call [phone]

## 2014-07-30 NOTE — Telephone Encounter (Signed)
If this gets worse he may need intravenous antibiotics in which case he should go to hospital ER.  We can try changing oral antibiotic but won't know for sure if it will cover.  If he wants to try outpatient oral antibiotic, my first thought would be to try augmentin, since he has been on Cipro. Augmentin is a penicillin, which has 10% chance of cross-reacting with his cephalosporin allergy (like cefepeme). If he is willing to try that: Augmentin 875 mg, # 20, 1 twice daily

## 2014-07-30 NOTE — Telephone Encounter (Signed)
LMTCB x 1 

## 2014-07-30 NOTE — Telephone Encounter (Signed)
Return call number listed below is incorrect.  Tried number listed on chart [id number]  Wife states that pt is having some sinus irritation/infection - wife states that infection seems to be going into lungs.  Sinus pressure, HA, head congestion, difficulty talking d/t increased SOB Denies fever - nothing above 99.3.  Patient has been doing nasal rinses daily.  Symptoms flared x 2 days (per pt) , wife states she's been noticing symptoms for a few weeks.  Allergies  Allergen Reactions  . Cefepime Hives  . Cephalosporins Hives  . Latex Dermatitis  . Tape Dermatitis  . Chlorhexidine Rash    Blisters     Medication List       This list is accurate as of: 07/30/14 12:47 PM.  Always use your most recent med list.               albuterol 108 (90 BASE) MCG/ACT inhaler  Commonly known as:  PROVENTIL HFA;VENTOLIN HFA  Inhale 2 puffs into the lungs every 6 (six) hours as needed for wheezing or shortness of breath.     azithromycin 250 MG tablet  Commonly known as:  ZITHROMAX  Take 1 tablet (250 mg total) by mouth daily.     budesonide 180 MCG/ACT inhaler  Commonly known as:  PULMICORT  INHALE 4 PUFFS INTO THE LUNGS TWICE DAILY     ciprofloxacin 750 MG tablet  Commonly known as:  CIPRO  Take 750 mg by mouth as needed.     desonide 0.05 % cream  Commonly known as:  DESOWEN  Apply 1 application topically 2 (two) times daily.     diclofenac sodium 1 % Gel  Commonly known as:  VOLTAREN  Apply 2 g topically 4 (four) times daily.     dornase alpha 1 MG/ML nebulizer solution  Commonly known as:  PULMOZYME  Inhale 2.5 mLs (2.5 mg total) into the lungs daily.     fluocinonide ointment 0.05 %  Commonly known as:  LIDEX  Apply 1 application topically as needed.     fluticasone 50 MCG/ACT nasal spray  Commonly known as:  FLONASE  2 sprays in each nostril two times daily     Guaifenesin 1200 MG Tb12  Take 1 tablet by mouth daily.     ICY HOT 16 % Liqd  Generic drug:   Menthol (Topical Analgesic)  Apply 1 application topically as needed.     Ipratropium-Albuterol 20-100 MCG/ACT Aers respimat  Commonly known as:  COMBIVENT  Inhale 1 puff into the lungs 4 (four) times daily.     levofloxacin 500 MG tablet  Commonly known as:  LEVAQUIN  Take 500 mg by mouth as needed.     lidocaine 5 %  Commonly known as:  LIDODERM  Place 1 patch onto the skin daily. Remove & Discard patch within 12 hours or as directed by MD     Magnesium Oxide 400 (240 MG) MG Tabs  Take 1 capsule by mouth 2 (two) times daily.     MUCINEX D [phone] MG Tb12  Generic drug:  Pseudoephedrine-Guaifenesin  Take 1 capsule by mouth 2 (two) times daily.     multivitamin with minerals Tabs tablet  Take 1 tablet by mouth daily.     mupirocin ointment 2 %  Commonly known as:  Lawrence 1 application into the nose as needed.     NONFORMULARY OR COMPOUNDED ITEM  Neilmed Isotonic Saline packets     NONFORMULARY OR COMPOUNDED ITEM  Neilmed Sinus  Refill Kit with bottle     SINUS RINSE REFILL Pack  Use 2 packets three times a day     Sodium Chloride (Inhalant) 7 % Nebu  Inhale 1 vial into the lungs 4 (four) times daily.     tobramycin (PF) 300 MG/5ML nebulizer solution  Commonly known as:  TOBI  Take 5 mLs (300 mg total) by nebulization 2 (two) times daily. 21 days on then 21 days off     triamcinolone ointment 0.1 %  Commonly known as:  KENALOG  Apply 1 application topically as needed.     Umeclidinium-Vilanterol 62.5-25 MCG/INH Aepb  Inhale 1 puff into the lungs daily.       Dr Annamaria Boots please advise. Thanks.

## 2014-08-03 ENCOUNTER — Telehealth: Payer: Self-pay | Admitting: Internal Medicine

## 2014-08-03 MED ORDER — BUDESONIDE 180 MCG/ACT IN AEPB: INHALATION_SPRAY | RESPIRATORY_TRACT | Status: DC

## 2014-08-03 NOTE — Telephone Encounter (Signed)
Lmomtcb x 1 

## 2014-08-03 NOTE — Telephone Encounter (Signed)
Spoke with spouse. Aware RX sent in. Nothing further needed 

## 2014-08-05 ENCOUNTER — Other Ambulatory Visit: Payer: Self-pay | Admitting: Internal Medicine

## 2014-08-05 MED ORDER — BUDESONIDE 180 MCG/ACT IN AEPB: INHALATION_SPRAY | RESPIRATORY_TRACT | Status: AC

## 2014-08-05 NOTE — Telephone Encounter (Signed)
Received paper refill from [facility] on [address] on Pulmicort 180mg  flexhaler, Inhale 4 puffs into the lungs twice a day, #2 inhalers.   Per CY approved with 11 refills.   Electronically faxed.

## 2014-08-06 ENCOUNTER — Ambulatory Visit: Payer: Self-pay | Admitting: Internal Medicine

## 2014-08-30 ENCOUNTER — Encounter: Payer: Self-pay | Admitting: Internal Medicine

## 2014-08-30 ENCOUNTER — Ambulatory Visit (INDEPENDENT_AMBULATORY_CARE_PROVIDER_SITE_OTHER): Payer: Self-pay | Admitting: Internal Medicine

## 2014-08-30 VITALS — BP 102/70 | HR 94 | Ht 69.0 in | Wt 193.0 lb

## 2014-08-30 DIAGNOSIS — J418 Mixed simple and mucopurulent chronic bronchitis: Secondary | ICD-10-CM

## 2014-08-30 DIAGNOSIS — Q348 Other specified congenital malformations of respiratory system: Secondary | ICD-10-CM

## 2014-08-30 DIAGNOSIS — J984 Other disorders of lung: Secondary | ICD-10-CM

## 2014-08-30 NOTE — Patient Instructions (Signed)
[facility]    Dx Primary Ciliary Dyskinesis

## 2014-08-30 NOTE — Assessment & Plan Note (Signed)
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 He continues aggressive pulmonary toilet measures but is oxygen dependent at night and will eventually develop more severe chronic hypoxic respiratory failure as he nears transplant status.

## 2014-08-30 NOTE — Assessment & Plan Note (Signed)
He asks about intravenous antibiotics every 3 months to suppress Pseudomonas, saying one of his 2 strains is resistant to tobramycin. Since all of [facility] now [facility] and he has had transplant evaluation there, I suggested it would be more appropriate to let them manage antibiotics. He does ask parallel referral to the [facility] lung transplant program for second opinion/comparison

## 2014-08-30 NOTE — Progress Notes (Signed)
Patient ID: Jesus AbernethyMichael J Cummings, male    DOB: Oct 22, 1960, 54 y.o.   MRN: [id number]  HPI 06/26/10- [age] yoM never smoker followed for chronic sinusitis and a CF-variant with chronic bronchitis, complicated by hx pulmonary hypertension.  Last here January 10, 2010. He has been getting his aerosol Tobi recently through a face mask to deliver it to nose and lower airway. He wants to try that for his pulmozyme as well. We discussed salinity of aerosols and airway irritability.  Swimming keeps his airways open, otherwise cough gets productive. He does use Flutter and VEST for pulmonary toilet. Denies change in exercise tolerance over time. Cipro has been his best fall-back drug. We talked about newer drugs. Worst time seems to be Fall and Spring. Most likely it is virus infections rather than seasonal allergy at those times.  12/03/10- 49 yoM never smoker followed for chronic sinusitis and a CF-variant with chronic bronchitis, complicated by hx pulmonary hypertension. ...wife here For at least the last month he reports wheezing too much which he calls asthma. Feels a coating in his airways, but cough is dry. Scant mucus from nose and chest. We had called in Levaquin but he says that never works well but did help some as he finishes his second week. Nasal discharge is still yellow with nose blowing and postnasal drip. We discussed his previous experience with several antibiotics and he remembers SwedenFactiv working well. 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 Because he was not particularly symptomatic, we chose to wait on treatment in hopes other flora would take over. He is still swimming but feels hot and cold with muscle aches after swimming. Wife has tried to take his temperature and suspects low grade fevers. He is taking more naps. He feels his chest stays clear only about a week after each antibiotic. 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  01/22/11- 49 yoM never smoker followed for chronic sinusitis and a CF-variant with chronic bronchitis, complicated by hx pulmonary hypertension. ...wife here Had flu vaccine. Has been working with Dr Comer/ ID. 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 AFB smear and culture were negative.  This weekend his chest felt tight her with deep breath and he was more dyspneic. No fever or sweat. Today he feels much improved and went for a good walk yesterday although he still doesn't feel well enough to swimming. Chest x-ray 12/04/2010 showed scarring with no active process. He describes getting extremely smothered trying to lie supine long enough to have the PICC line placed. This is unusual for him. His feet have not been swelling. He has no residual chest discomfort other than his chronic cough. He has been using a Estate agentVest percusor, Flutter, and wife does chest PT. we discussed using an incentive spirometer to help him assess how deep of breath he is taking.  07/05/11- 49 yoM never smoker followed for chronic sinusitis and a CF-variant with chronic bronchitis, complicated by hx pulmonary hypertension. ...wife here  "doing pretty good with breathing"; denies SOB, or wheezing, no cough. Still having the usual chest congestion. H. stop his Daliresp as "not needed". He continues to swim regularly for exercise. After an unproductive [facility] he had evaluation by [facility] Nils FlackPedar Noone [facility]. He was hospitalized there  for 2 days with intravenous tobramycin and ceftaz been home for 3 weeks with those medications by PICC line. 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 He also went to ENT at [facility] and they're planning to see him 4 times per year for "sinus cleaning". A  different kind of pulmonary toilet device was suggested. He tried a VEST for pulmonary toilet but has not been using it routinely. He continues nebulized tobramycin 3 weeks on and 3 weeks off.  08/05/12- 51 yoM never smoker followed for chronic sinusitis , Primary Ciliary Dyskinesia, chronic bronchitis/ bronchiectasis, complicated by hx pulmonary hypertension. FOLLOWS FOR: ? start of an infection; past few days had trouble breathing due to humidity. [facility] doubts CF- favors Immotile Cilia.recent thick yellow white mucus. Daily Flutter, swim, continues 3 weeks on and 3 weeks off Tobi neb, does Percussion Vest, Neb Pulmozyme/ Dornase. Never could get nebulized Mucomyst.  09/24/13- 52 yoM never smoker followed for chronic sinusitis and Primary Ciliary Dyskinesia complicated by hx pulmonary hypertension. FOLLOWS FOR: Recently at [facility] for 1 week; had pseudomonal pneumonia. Discharged on O2 Sleep.   08/30/14-52 yoM never smoker followed for chronic sinusitis and Primary Ciliary Dyskinesiaa  complicated by hx pulmonary hypertension. Follows For: Pt states breathing is unchanged. Denies any SOB, cough, wheezing.  He has had extensive lung transplant evaluation at [facility], including ongoing pulmonary testing and imaging. He is interested in comparing the program at [facility] and asks our referral to allow him to meet that team. He very much likes maintenance Anoro but complains that there is no response to albuterol or Combivent as a rescue inhaler on top of Anoro. He tries this if his saturations are low some days. Ongoing productive cough "fluid" with little wheeze. Continues inhaled tobramycin. 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 He asks about getting intravenous antibiotics every 3 months. We talked about how this would be managed and I suggested it might be better handled through his current [facility] pulmonary program [facility]. We don't have any of his lab work. He continues  oxygen for sleep 2 L through [facility]. He no longer has enough respiratory reserve to swim for exercise.  Review of Systems- see HPI but is comparing program at [facility] and asks if we would refer him there to open the door for his discussion with that team Constitutional:   No-   weight loss, night sweats,  +fevers, chills, fatigue, lassitude. HEENT:   No-  headaches, difficulty swallowing, tooth/dental problems, sore throat,       No-  sneezing, itching, ear ache,  +nasal congestion, post nasal drip,  CV:  No-   chest pain, orthopnea, PND, swelling in lower extremities, anasarca, dizziness, palpitations Resp: +  shortness of breath with exertion or at rest.              + productive cough,  + non-productive cough,  No- coughing up of blood.               change in color of mucus.  No- wheezing.   Skin: No-   rash or lesions. GI:  No-   heartburn, indigestion, abdominal pain, nausea, vomiting, GU: ain. MS:  No-   joint pain or swelling.  . Neuro-     nothing unusual Psych:  No- change in mood or affect. No depression or anxiety.  No memory loss.  Objective:   Physical Exam General- Alert, Oriented, Affect-appropriate, Distress- none acute Skin- rash-none, lesions- none, excoriation- none Lymphadenopathy- none Head- atraumatic            Eyes- Gross vision intact, PERRLA, conjunctivae clear secretions            Ears- Hearing, canals-normal            Nose- Clear, no-Septal dev, mucus, polyps, erosion, perforation             Throat- Mallampati II , mucosa clear , drainage- none, tonsils- atrophic, + hoarse Neck- flexible , trachea midline, no stridor , thyroid nl, carotid no bruit Chest - symmetrical excursion , unlabored           Heart/CV- RRR , no murmur , no gallop  , no rub, nl s1 s2                           - JVD- none , edema- none, stasis changes- none, varices- none           Lung- + minimal rhonchi in bases, wheeze-none, cough- none , dullness-none, rub- none.  Unlabored           Chest wall-  Abd-  Br/ Gen/ Rectal- Not done, not indicated Extrem- cyanosis- none, clubbing, none, atrophy- none, strength- nl Neuro- grossly intact to observation

## 2014-09-28 ENCOUNTER — Telehealth: Payer: Self-pay | Admitting: Internal Medicine

## 2014-09-28 NOTE — Telephone Encounter (Signed)
Per Alfonso Ramus  Regarding Urgent Referral to [facility] Lung Transplant. Denise with [facility] stated that they have left multiple messages for pt. Angelique Blonder has never spoke with patient only pt's wife. No appointment has been scheduled yet. Wife last request from [facility] was to have the transplant coordinator contact them, when he did on 09/24/14. No return call by pt or wife. Just FYI - no appointment scheduled yet, and [facility] may be close to closing the referral after multiple attempts to scheduleAngelique Blonder with [facility] # [phone]. Thanks, Bjorn Loser

## 2014-09-28 NOTE — Telephone Encounter (Signed)
Noted. He had requested we contact [facility] for him so he could compare to transplant program at [facility]. Apparently not interested in pursuing this now.

## 2014-09-28 NOTE — Telephone Encounter (Signed)
FYI to Dr. Young

## 2014-09-29 NOTE — Telephone Encounter (Signed)
Called and spoke to pt's wife. Informed her [facility] is close to closing the case unless pt calls. Denise's number given. Pt is to call. Nothing further needed at this time.

## 2014-11-12 ENCOUNTER — Other Ambulatory Visit: Payer: Self-pay | Admitting: Internal Medicine

## 2016-08-17 MED FILL — COLISTIMETHATE SODIUM/150MG/SOLR: COLISTIMETHATE SODIUM/150MG/SOLR | 14 days supply | Qty: 28 | Fill #1

## 2016-08-17 MED FILL — STERILE WATER INJ//10ML: STERILE WATER INJ//10ML | 14 days supply | Qty: 14 | Fill #1

## 2016-08-23 MED ORDER — PREDNISONE 10 MG TABLET
ORAL_TABLET | 3 refills | 0 days | Status: CP
Start: 2016-08-23 — End: 2016-09-24

## 2016-08-23 MED ORDER — FUROSEMIDE 20 MG TABLET
ORAL_TABLET | Freq: Every day | ORAL | 3 refills | 0 days | Status: CP
Start: 2016-08-23 — End: 2017-06-28

## 2016-08-23 MED FILL — TACROLIMUS/1MG/CAPS: TACROLIMUS/1MG/CAPS | 30 days supply | Qty: 180 | Fill #2

## 2016-08-23 MED FILL — CYPROHEPTADINE HCL/4MG/TABS: CYPROHEPTADINE HCL/4MG/TABS | 30 days supply | Qty: 30 | Fill #4

## 2016-08-23 MED FILL — PREDNISONE/10MG/TAB: PREDNISONE/10MG/TAB | 90 days supply | Qty: 135 | Fill #0

## 2016-08-27 MED FILL — ALBUTEROL SULFATE/NEBU0.083%(2./NEBU: ALBUTEROL SULFATE/NEBU0.083%(2./NEBU | 30 days supply | Qty: 4 | Fill #2

## 2016-08-28 ENCOUNTER — Ambulatory Visit
Admission: RE | Admit: 2016-08-28 | Discharge: 2016-08-28 | Disposition: A | Discharge status: Home / Self Care | Payer: BC Managed Care – PPO

## 2016-08-28 DIAGNOSIS — Z942 Lung transplant status: Principal | ICD-10-CM

## 2016-08-28 DIAGNOSIS — Q348 Other specified congenital malformations of respiratory system: Secondary | ICD-10-CM

## 2016-08-28 DIAGNOSIS — J479 Bronchiectasis, uncomplicated: Secondary | ICD-10-CM

## 2016-08-28 DIAGNOSIS — Z23 Encounter for immunization: Secondary | ICD-10-CM

## 2016-08-28 MED ORDER — CIPROFLOXACIN 500 MG TABLET
ORAL_TABLET | Freq: Two times a day (BID) | ORAL | 0 refills | 0.00000 days | Status: CP
Start: 2016-08-28 — End: 2016-09-27

## 2016-08-28 MED ORDER — ALBUTEROL SULFATE HFA 90 MCG/ACTUATION AEROSOL INHALER
Freq: Four times a day (QID) | RESPIRATORY_TRACT | 11 refills | 0 days | Status: CP | PRN
Start: 2016-08-28 — End: 2017-09-05

## 2016-08-28 MED FILL — VENTOLIN HFA/90MCG/AER: VENTOLIN HFA/90MCG/AER | 25 days supply | Qty: 1 | Fill #0

## 2016-08-28 MED FILL — CIPROFLOXACIN/500MG/TABS: CIPROFLOXACIN/500MG/TABS | 30 days supply | Qty: 60 | Fill #0

## 2016-08-31 MED FILL — STERILE WATER INJ//10ML: STERILE WATER INJ//10ML | 15 days supply | Qty: 30 | Fill #2

## 2016-09-03 ENCOUNTER — Ambulatory Visit: Admission: RE | Admit: 2016-09-03 | Discharge: 2016-09-03 | Payer: BC Managed Care – PPO

## 2016-09-03 ENCOUNTER — Ambulatory Visit
Admission: RE | Admit: 2016-09-03 | Discharge: 2016-09-03 | Payer: BC Managed Care – PPO | Attending: Otolaryngology | Admitting: Otolaryngology

## 2016-09-03 DIAGNOSIS — H9393 Unspecified disorder of ear, bilateral: Principal | ICD-10-CM

## 2016-09-03 DIAGNOSIS — H6983 Other specified disorders of Eustachian tube, bilateral: Secondary | ICD-10-CM

## 2016-09-07 MED FILL — BD 3mL/25G 5/8 INCH/SYR: BD 3mL/25G 5/8 INCH/SYR | 30 days supply | Qty: 60 | Fill #4

## 2016-09-07 MED FILL — FLUTICASONE/50MCG/ACT/SUSP: FLUTICASONE/50MCG/ACT/SUSP | 90 days supply | Qty: 3 | Fill #1

## 2016-09-07 MED FILL — COLISTIMETHATE SODIUM/150MG/SOLR: COLISTIMETHATE SODIUM/150MG/SOLR | 14 days supply | Qty: 28 | Fill #2

## 2016-09-07 MED FILL — ITRACONAZOLE/100MG/CAPS: ITRACONAZOLE/100MG/CAPS | 30 days supply | Qty: 120 | Fill #2

## 2016-09-11 ENCOUNTER — Ambulatory Visit
Admission: RE | Admit: 2016-09-11 | Discharge: 2016-09-11 | Disposition: A | Discharge status: Home / Self Care | Payer: BC Managed Care – PPO

## 2016-09-11 DIAGNOSIS — Z942 Lung transplant status: Principal | ICD-10-CM

## 2016-09-11 MED ORDER — AMIKACIN 2% OPHTHALMIC SOLN
11 refills | 0 days | Status: CP
Start: 2016-09-11 — End: 2016-12-28

## 2016-09-11 MED ORDER — AMIKACIN 500 MG/2 ML INJECTION SOLUTION
11 refills | 0 days
Start: 2016-09-11 — End: 2017-09-19

## 2016-09-11 MED FILL — AMIKACIN SULFATE/500/2ML/SOLN: AMIKACIN SULFATE/500/2ML/SOLN | 30 days supply | Qty: 60 | Fill #0

## 2016-09-12 DIAGNOSIS — H65493 Other chronic nonsuppurative otitis media, bilateral: Principal | ICD-10-CM

## 2016-09-12 MED FILL — STERILE WATER INJ//10ML: STERILE WATER INJ//10ML | 15 days supply | Qty: 30 | Fill #3

## 2016-09-13 ENCOUNTER — Ambulatory Visit
Admission: RE | Admit: 2016-09-13 | Discharge: 2016-09-13 | Disposition: A | Discharge status: Home / Self Care | Payer: BC Managed Care – PPO | Attending: Certified Registered" | Admitting: Certified Registered"

## 2016-09-13 ENCOUNTER — Ambulatory Visit
Admission: RE | Admit: 2016-09-13 | Discharge: 2016-09-13 | Disposition: A | Discharge status: Home / Self Care | Payer: BC Managed Care – PPO | Attending: Otolaryngology | Admitting: Otolaryngology

## 2016-09-13 DIAGNOSIS — H65493 Other chronic nonsuppurative otitis media, bilateral: Principal | ICD-10-CM

## 2016-09-13 MED FILL — PANTOPRAZOLE/40MG/TBEC: PANTOPRAZOLE/40MG/TBEC | 90 days supply | Qty: 90 | Fill #2

## 2016-09-18 ENCOUNTER — Ambulatory Visit
Admission: RE | Admit: 2016-09-18 | Discharge: 2016-09-18 | Disposition: A | Discharge status: Home / Self Care | Payer: BC Managed Care – PPO | Admitting: Pulmonary Disease

## 2016-09-18 ENCOUNTER — Ambulatory Visit
Admission: RE | Admit: 2016-09-18 | Discharge: 2016-09-18 | Disposition: A | Discharge status: Home / Self Care | Payer: BC Managed Care – PPO

## 2016-09-18 DIAGNOSIS — Z942 Lung transplant status: Principal | ICD-10-CM

## 2016-09-18 MED ORDER — TACROLIMUS 1 MG CAPSULE
ORAL_CAPSULE | Freq: Two times a day (BID) | ORAL | 11 refills | 0 days | Status: SS
Start: 2016-09-18 — End: 2016-09-24

## 2016-09-19 ENCOUNTER — Ambulatory Visit
Admission: RE | Admit: 2016-09-19 | Discharge: 2016-09-19 | Disposition: A | Discharge status: Home / Self Care | Payer: BC Managed Care – PPO | Admitting: Pulmonary Disease

## 2016-09-19 ENCOUNTER — Ambulatory Visit
Admission: RE | Admit: 2016-09-19 | Discharge: 2016-09-19 | Disposition: A | Discharge status: Home / Self Care | Payer: BC Managed Care – PPO

## 2016-09-19 DIAGNOSIS — J479 Bronchiectasis, uncomplicated: Principal | ICD-10-CM

## 2016-09-21 ENCOUNTER — Inpatient Hospital Stay
Admission: EM | Admit: 2016-09-21 | Discharge: 2016-09-24 | Disposition: A | Discharge status: Home Health | Payer: BC Managed Care – PPO | Source: Intra-hospital

## 2016-09-21 ENCOUNTER — Inpatient Hospital Stay
Admission: EM | Admit: 2016-09-21 | Discharge: 2016-09-24 | Disposition: A | Discharge status: Home Health | Payer: BC Managed Care – PPO | Source: Intra-hospital | Attending: Pulmonary Disease | Admitting: Pulmonary Disease

## 2016-09-21 ENCOUNTER — Inpatient Hospital Stay
Admission: EM | Admit: 2016-09-21 | Discharge: 2016-09-24 | Disposition: A | Discharge status: Home Health | Payer: BC Managed Care – PPO | Source: Intra-hospital | Attending: Pulmonary Disease

## 2016-09-21 DIAGNOSIS — L03213 Periorbital cellulitis: Principal | ICD-10-CM

## 2016-09-21 DIAGNOSIS — J329 Chronic sinusitis, unspecified: Secondary | ICD-10-CM

## 2016-09-21 DIAGNOSIS — Z942 Lung transplant status: Principal | ICD-10-CM

## 2016-09-22 MED ORDER — MEROPENEM IVPB 1000 MG CONNECTOR BAG
Freq: Three times a day (TID) | INTRAVENOUS | 0 refills | 0.00000 days | Status: CP
Start: 2016-09-22 — End: 2016-10-13

## 2016-09-24 DIAGNOSIS — L03213 Periorbital cellulitis: Principal | ICD-10-CM

## 2016-09-24 MED ORDER — TACROLIMUS 1 MG CAPSULE: 1 mg | capsule | Freq: Every day | 11 refills | 0 days | Status: AC

## 2016-09-24 MED ORDER — TACROLIMUS 1 MG CAPSULE: 2 mg | capsule | Freq: Every day | 11 refills | 0 days | Status: AC

## 2016-09-24 MED ORDER — TACROLIMUS 1 MG CAPSULE
ORAL_CAPSULE | Freq: Every day | ORAL | 11 refills | 0.00000 days | Status: CP
Start: 2016-09-24 — End: 2016-10-18

## 2016-09-24 MED ORDER — PREDNISONE 10 MG TABLET
ORAL_TABLET | Freq: Every day | ORAL | 4 refills | 0.00000 days | Status: CP
Start: 2016-09-24 — End: 2017-01-25

## 2016-09-24 MED ORDER — TACROLIMUS 1 MG CAPSULE: capsule | 11 refills | 0 days

## 2016-09-24 MED ORDER — PREDNISONE 10 MG TABLET: 10 mg | tablet | Freq: Every day | 4 refills | 0 days | Status: AC

## 2016-09-24 MED ORDER — FLUTICASONE PROPIONATE 50 MCG/ACTUATION NASAL SPRAY,SUSPENSION: 2 | g | 0 refills | 0 days

## 2016-09-24 MED ORDER — FLUTICASONE PROPIONATE 50 MCG/ACTUATION NASAL SPRAY,SUSPENSION
Freq: Two times a day (BID) | NASAL | 0 refills | 0.00000 days | Status: CP
Start: 2016-09-24 — End: 2016-09-24

## 2016-09-24 MED ORDER — DOXYCYCLINE HYCLATE 100 MG TABLET: tablet | 0 refills | 0 days

## 2016-09-24 MED ORDER — DOXYCYCLINE HYCLATE 100 MG TABLET
ORAL_TABLET | Freq: Two times a day (BID) | ORAL | 0 refills | 0.00000 days | Status: CP
Start: 2016-09-24 — End: 2016-09-24

## 2016-09-24 MED FILL — DOXYCYCLINE HYCLATE/100MG/TABS: DOXYCYCLINE HYCLATE/100MG/TABS | 19 days supply | Qty: 38 | Fill #0

## 2016-09-24 MED FILL — TACROLIMUS/1MG/CAPS: TACROLIMUS/1MG/CAPS | 70 days supply | Qty: 210 | Fill #0

## 2016-09-29 ENCOUNTER — Ambulatory Visit
Admission: RE | Admit: 2016-09-29 | Discharge: 2016-09-29 | Disposition: A | Discharge status: Home / Self Care | Payer: BC Managed Care – PPO

## 2016-09-29 DIAGNOSIS — Z942 Lung transplant status: Principal | ICD-10-CM

## 2016-10-05 MED ORDER — VALGANCICLOVIR 450 MG TABLET: 900 mg | tablet | Freq: Two times a day (BID) | 3 refills | 0 days | Status: AC

## 2016-10-05 MED ORDER — MYCOPHENOLATE MOFETIL 500 MG TABLET: tablet | 3 refills | 0 days

## 2016-10-05 MED ORDER — MYCOPHENOLATE MOFETIL 500 MG TABLET
ORAL_TABLET | ORAL | 3 refills | 0.00000 days | Status: CP
Start: 2016-10-05 — End: 2016-10-16

## 2016-10-05 MED ORDER — VALGANCICLOVIR 450 MG TABLET
ORAL_TABLET | Freq: Two times a day (BID) | ORAL | 3 refills | 0.00000 days | Status: CP
Start: 2016-10-05 — End: 2016-12-06

## 2016-10-05 MED FILL — VENTOLIN HFA/90MCG/AER: VENTOLIN HFA/90MCG/AER | 25 days supply | Qty: 1 | Fill #1

## 2016-10-05 MED FILL — VALGANCICLOVIR/450MG/TABS: VALGANCICLOVIR/450MG/TABS | 15 days supply | Qty: 60 | Fill #0

## 2016-10-06 MED FILL — CYPROHEPTADINE HCL/4MG/TABS: CYPROHEPTADINE HCL/4MG/TABS | 30 days supply | Qty: 30 | Fill #5

## 2016-10-16 ENCOUNTER — Ambulatory Visit
Admission: RE | Admit: 2016-10-16 | Discharge: 2016-10-16 | Disposition: A | Discharge status: Home / Self Care | Payer: BC Managed Care – PPO

## 2016-10-16 ENCOUNTER — Ambulatory Visit
Admission: RE | Admit: 2016-10-16 | Discharge: 2016-10-16 | Disposition: A | Discharge status: Home / Self Care | Payer: BC Managed Care – PPO | Attending: Infectious Disease | Admitting: Infectious Disease

## 2016-10-16 DIAGNOSIS — Z942 Lung transplant status: Principal | ICD-10-CM

## 2016-10-16 DIAGNOSIS — B259 Cytomegaloviral disease, unspecified: Secondary | ICD-10-CM

## 2016-10-16 DIAGNOSIS — J329 Chronic sinusitis, unspecified: Principal | ICD-10-CM

## 2016-10-16 MED ORDER — MYCOPHENOLATE MOFETIL 250 MG CAPSULE
Freq: Two times a day (BID) | ORAL | 3 refills | 0.00000 days | Status: CP
Start: 2016-10-16 — End: 2016-10-16

## 2016-10-16 MED ORDER — MYCOPHENOLATE MOFETIL 500 MG TABLET: tablet | 3 refills | 0 days

## 2016-10-16 MED ORDER — MYCOPHENOLATE MOFETIL 250 MG CAPSULE: each | 3 refills | 0 days

## 2016-10-16 MED ORDER — MYCOPHENOLATE MOFETIL 500 MG TABLET
ORAL_TABLET | Freq: Two times a day (BID) | ORAL | 3 refills | 0.00000 days | Status: CP
Start: 2016-10-16 — End: 2016-12-06

## 2016-10-16 MED FILL — ITRACONAZOLE/100MG/CAPS: ITRACONAZOLE/100MG/CAPS | 30 days supply | Qty: 120 | Fill #3

## 2016-10-17 MED FILL — VALGANCICLOVIR/450MG/TABS: VALGANCICLOVIR/450MG/TABS | 15 days supply | Qty: 60 | Fill #1

## 2016-10-18 ENCOUNTER — Ambulatory Visit
Admission: RE | Admit: 2016-10-18 | Discharge: 2016-10-18 | Disposition: A | Discharge status: Home / Self Care | Payer: BC Managed Care – PPO

## 2016-10-18 DIAGNOSIS — Z942 Lung transplant status: Principal | ICD-10-CM

## 2016-10-18 DIAGNOSIS — J329 Chronic sinusitis, unspecified: Principal | ICD-10-CM

## 2016-10-18 MED ORDER — TACROLIMUS 0.5 MG CAPSULE
ORAL_CAPSULE | 2 refills | 0 days
Start: 2016-10-18 — End: 2016-10-23

## 2016-10-23 MED ORDER — TACROLIMUS 0.5 MG CAPSULE
ORAL_CAPSULE | ORAL | 3 refills | 0.00000 days | Status: CP
Start: 2016-10-23 — End: 2017-01-31

## 2016-10-23 MED ORDER — TACROLIMUS 0.5 MG CAPSULE: each | 3 refills | 0 days

## 2016-10-23 MED FILL — TACROLIMUS/0.5MG/CAPS: TACROLIMUS/0.5MG/CAPS | 90 days supply | Qty: 90 | Fill #0

## 2016-10-30 MED FILL — VALGANCICLOVIR/450MG/TABS: VALGANCICLOVIR/450MG/TABS | 15 days supply | Qty: 60 | Fill #2

## 2016-11-07 ENCOUNTER — Ambulatory Visit
Admission: RE | Admit: 2016-11-07 | Discharge: 2016-11-07 | Disposition: A | Discharge status: Home / Self Care | Payer: BC Managed Care – PPO

## 2016-11-07 DIAGNOSIS — Z942 Lung transplant status: Principal | ICD-10-CM

## 2016-11-07 MED ORDER — MYCOPHENOLATE MOFETIL 250 MG CAPSULE
ORAL_CAPSULE | Freq: Two times a day (BID) | ORAL | 3 refills | 0.00000 days | Status: CP
Start: 2016-11-07 — End: 2016-12-06

## 2016-11-07 MED ORDER — TACROLIMUS 1 MG CAPSULE
ORAL_CAPSULE | Freq: Two times a day (BID) | ORAL | 11 refills | 0 days
Start: 2016-11-07 — End: 2017-02-18

## 2016-11-07 MED ORDER — MYCOPHENOLATE MOFETIL 250 MG CAPSULE: each | 3 refills | 0 days

## 2016-11-17 ENCOUNTER — Ambulatory Visit
Admission: RE | Admit: 2016-11-17 | Discharge: 2016-11-17 | Disposition: A | Discharge status: Home / Self Care | Payer: BC Managed Care – PPO

## 2016-11-17 DIAGNOSIS — Z942 Lung transplant status: Principal | ICD-10-CM

## 2016-11-19 MED ORDER — CYPROHEPTADINE 4 MG TABLET: 4 mg | tablet | Freq: Every evening | 0 refills | 0 days | Status: AC

## 2016-11-19 MED ORDER — CYPROHEPTADINE 4 MG TABLET
ORAL_TABLET | Freq: Every evening | ORAL | 99 refills | 0.00000 days | Status: CP
Start: 2016-11-19 — End: 2017-09-19

## 2016-11-19 MED FILL — CYPROHEPTADINE HCL/4MG/TABS: CYPROHEPTADINE HCL/4MG/TABS | 30 days supply | Qty: 30 | Fill #0

## 2016-11-19 MED FILL — VALGANCICLOVIR/450MG/TABS: VALGANCICLOVIR/450MG/TABS | 15 days supply | Qty: 60 | Fill #3

## 2016-11-19 MED FILL — ITRACONAZOLE/100MG/CAPS: ITRACONAZOLE/100MG/CAPS | 30 days supply | Qty: 120 | Fill #4

## 2016-11-26 MED FILL — AMIKACIN SULFATE/500/2ML/SOLN: AMIKACIN SULFATE/500/2ML/SOLN | 30 days supply | Qty: 60 | Fill #1

## 2016-11-26 MED FILL — VENTOLIN HFA/90MCG/AER: VENTOLIN HFA/90MCG/AER | 25 days supply | Qty: 1 | Fill #2

## 2016-12-04 MED FILL — VALGANCICLOVIR/450MG/TABS: VALGANCICLOVIR/450MG/TABS | 15 days supply | Qty: 60 | Fill #4

## 2016-12-06 MED ORDER — MYCOPHENOLATE MOFETIL 500 MG TABLET
ORAL_TABLET | Freq: Two times a day (BID) | ORAL | 3 refills | 0 days | Status: CP
Start: 2016-12-06 — End: 2017-08-19

## 2016-12-06 MED ORDER — TAMIFLU 75 MG CAPSULE
ORAL_CAPSULE | Freq: Every day | ORAL | 0 refills | 0.00000 days | Status: CP
Start: 2016-12-06 — End: 2016-12-07

## 2016-12-06 MED ORDER — VALGANCICLOVIR 450 MG TABLET
ORAL_TABLET | Freq: Two times a day (BID) | ORAL | 3 refills | 0 days | Status: CP
Start: 2016-12-06 — End: 2016-12-07

## 2016-12-06 MED ORDER — MYCOPHENOLATE MOFETIL 250 MG CAPSULE
ORAL_CAPSULE | Freq: Two times a day (BID) | ORAL | 3 refills | 0 days | Status: CP
Start: 2016-12-06 — End: 2017-02-20

## 2016-12-07 MED ORDER — TAMIFLU 75 MG CAPSULE
ORAL_CAPSULE | Freq: Every day | ORAL | 0 refills | 0 days | Status: CP
Start: 2016-12-07 — End: 2016-12-19

## 2016-12-07 MED ORDER — VALGANCICLOVIR 450 MG TABLET
ORAL_TABLET | Freq: Two times a day (BID) | ORAL | 3 refills | 0.00000 days | Status: CP
Start: 2016-12-07 — End: 2016-12-25

## 2016-12-19 MED ORDER — OSELTAMIVIR 75 MG CAPSULE
ORAL_CAPSULE | Freq: Two times a day (BID) | ORAL | 0 refills | 0.00000 days | Status: CP
Start: 2016-12-19 — End: 2017-02-22

## 2016-12-19 MED ORDER — OSELTAMIVIR 75 MG CAPSULE: 75 mg | capsule | Freq: Two times a day (BID) | 0 refills | 0 days | Status: AC

## 2016-12-19 MED FILL — ITRACONAZOLE/100MG/CAPS: ITRACONAZOLE/100MG/CAPS | 30 days supply | Qty: 120 | Fill #5

## 2016-12-21 ENCOUNTER — Ambulatory Visit
Admission: RE | Admit: 2016-12-21 | Discharge: 2016-12-21 | Disposition: A | Discharge status: Home / Self Care | Payer: BC Managed Care – PPO

## 2016-12-21 DIAGNOSIS — B259 Cytomegaloviral disease, unspecified: Principal | ICD-10-CM

## 2016-12-21 DIAGNOSIS — Z942 Lung transplant status: Principal | ICD-10-CM

## 2016-12-21 MED FILL — PANTOPRAZOLE/40MG/TBEC: PANTOPRAZOLE/40MG/TBEC | 90 days supply | Qty: 90 | Fill #3

## 2016-12-21 MED FILL — TACROLIMUS/1MG/CAPS: TACROLIMUS/1MG/CAPS | 70 days supply | Qty: 210 | Fill #1

## 2016-12-21 MED FILL — CYPROHEPTADINE HCL/4MG/TABS: CYPROHEPTADINE HCL/4MG/TABS | 30 days supply | Qty: 30 | Fill #1

## 2016-12-25 MED ORDER — VALGANCICLOVIR 450 MG TABLET
ORAL_TABLET | Freq: Every day | ORAL | 7 refills | 0.00000 days | Status: CP
Start: 2016-12-25 — End: 2016-12-25

## 2016-12-25 MED ORDER — VALGANCICLOVIR 450 MG TABLET: tablet | 7 refills | 0 days

## 2016-12-26 MED ORDER — VALGANCICLOVIR 450 MG TABLET
ORAL_TABLET | Freq: Every day | ORAL | 2 refills | 0 days
Start: 2016-12-26 — End: 2017-06-28

## 2016-12-28 MED ORDER — SYRINGE WITH NEEDLE 3 ML 25 X 5/8"
99 refills | 0.00000 days | Status: CP
Start: 2016-12-28 — End: 2016-12-28

## 2016-12-28 MED ORDER — SYRINGE WITH NEEDLE 3 ML 25 X 5/8": each | 0 refills | 0 days | Status: AC

## 2016-12-28 MED ORDER — COLISTIN (COLISTIMETHATE SODIUM) 150 MG SOLUTION FOR INJECTION
7 refills | 0 days
Start: 2016-12-28 — End: 2017-09-19

## 2016-12-28 MED ORDER — AMIKACIN 500 MG/2 ML INJECTION SOLUTION
11 refills | 0 days | Status: CP
Start: 2016-12-28 — End: 2018-01-23

## 2016-12-28 MED ORDER — COLISTIMETHATE (COLISTIN) INHALATION
Freq: Two times a day (BID) | RESPIRATORY_TRACT | 6 refills | 0 days | Status: CP
Start: 2016-12-28 — End: 2017-06-28

## 2016-12-28 MED ORDER — AMIKACIN 2% OPHTHALMIC SOLN
11 refills | 0 days | Status: CP
Start: 2016-12-28 — End: 2017-06-28

## 2016-12-28 MED FILL — COLISTIMETHATE SODIUM/150MG/SOLR: COLISTIMETHATE SODIUM/150MG/SOLR | 25 days supply | Qty: 50 | Fill #0

## 2016-12-28 MED FILL — BD 3mL/25G 5/8 INCH/SYR: BD 3mL/25G 5/8 INCH/SYR | 30 days supply | Qty: 60 | Fill #0

## 2016-12-28 MED FILL — AMIKACIN SULFATE/500/2ML/SOLN: AMIKACIN SULFATE/500/2ML/SOLN | 30 days supply | Qty: 60 | Fill #0

## 2016-12-29 MED ORDER — WATER FOR INJECTION, STERILE INJECTION SOLUTION: ampule | 11 refills | 0 days | Status: AC

## 2016-12-29 MED ORDER — WATER FOR INJECTION, STERILE INJECTION SOLUTION
INJECTION | ORAL | 11 refills | 0.00000 days | Status: CP
Start: 2016-12-29 — End: 2017-06-28

## 2017-01-14 MED FILL — ITRACONAZOLE/100MG/CAPS: ITRACONAZOLE/100MG/CAPS | 30 days supply | Qty: 120 | Fill #6

## 2017-01-25 ENCOUNTER — Ambulatory Visit
Admission: RE | Admit: 2017-01-25 | Discharge: 2017-01-25 | Disposition: A | Discharge status: Home / Self Care | Payer: BC Managed Care – PPO

## 2017-01-25 DIAGNOSIS — Z13820 Encounter for screening for osteoporosis: Principal | ICD-10-CM

## 2017-01-25 DIAGNOSIS — M80879A Other osteoporosis with current pathological fracture, unspecified ankle and foot, initial encounter for fracture: Secondary | ICD-10-CM

## 2017-01-25 DIAGNOSIS — Z942 Lung transplant status: Principal | ICD-10-CM

## 2017-01-25 MED ORDER — PREDNISONE 10 MG TABLET
ORAL_TABLET | Freq: Every day | ORAL | 4 refills | 0.00000 days | Status: CP
Start: 2017-01-25 — End: 2017-01-25

## 2017-01-25 MED ORDER — PREDNISONE 10 MG TABLET: 10 mg | tablet | 4 refills | 0 days

## 2017-01-25 MED FILL — PREDNISONE/10MG/TAB: PREDNISONE/10MG/TAB | 60 days supply | Qty: 60 | Fill #0

## 2017-01-25 MED FILL — VENTOLIN HFA/90MCG/AER: VENTOLIN HFA/90MCG/AER | 25 days supply | Qty: 1 | Fill #3

## 2017-01-25 MED FILL — CYPROHEPTADINE HCL/4MG/TABS: CYPROHEPTADINE HCL/4MG/TABS | 30 days supply | Qty: 30 | Fill #2

## 2017-01-31 MED ORDER — TACROLIMUS 0.5 MG CAPSULE
ORAL | 11 refills | 0.00000 days | Status: CP
Start: 2017-01-31 — End: 2017-01-31

## 2017-01-31 MED ORDER — TACROLIMUS 0.5 MG CAPSULE: capsule | 3 refills | 0 days | Status: AC

## 2017-02-02 MED ORDER — ALENDRONATE 70 MG TABLET
ORAL_TABLET | ORAL | 3 refills | 0.00000 days | Status: CP
Start: 2017-02-02 — End: 2017-03-21

## 2017-02-02 MED ORDER — ALENDRONATE 70 MG TABLET: 70 mg | tablet | 3 refills | 0 days | Status: AC

## 2017-02-07 ENCOUNTER — Ambulatory Visit
Admission: RE | Admit: 2017-02-07 | Discharge: 2017-02-07 | Disposition: A | Discharge status: Home / Self Care | Payer: BC Managed Care – PPO

## 2017-02-07 DIAGNOSIS — Z942 Lung transplant status: Principal | ICD-10-CM

## 2017-02-15 MED ORDER — ITRACONAZOLE 100 MG CAPSULE
ORAL_CAPSULE | Freq: Two times a day (BID) | ORAL | 11 refills | 0.00000 days | Status: CP
Start: 2017-02-15 — End: 2017-02-15

## 2017-02-15 MED ORDER — ITRACONAZOLE 100 MG CAPSULE: 200 mg | capsule | Freq: Two times a day (BID) | 3 refills | 0 days | Status: AC

## 2017-02-15 MED FILL — TACROLIMUS/1MG/CAPS: TACROLIMUS/1MG/CAPS | 70 days supply | Qty: 210 | Fill #2

## 2017-02-18 MED ORDER — ITRACONAZOLE 100 MG CAPSULE
ORAL_CAPSULE | Freq: Two times a day (BID) | ORAL | 3 refills | 0.00000 days | Status: CP
Start: 2017-02-18 — End: 2017-07-25

## 2017-02-18 MED ORDER — TACROLIMUS 1 MG CAPSULE
11 refills | 0 days | Status: CP
Start: 2017-02-18 — End: 2017-02-18

## 2017-02-18 MED ORDER — TACROLIMUS 0.5 MG CAPSULE
ORAL_CAPSULE | 3 refills | 0 days | Status: CP
Start: 2017-02-18 — End: 2017-07-18

## 2017-02-18 MED ORDER — TACROLIMUS 1 MG CAPSULE: each | 11 refills | 0 days

## 2017-02-18 MED ORDER — ITRACONAZOLE 100 MG CAPSULE: 200 mg | capsule | Freq: Two times a day (BID) | 3 refills | 0 days | Status: AC

## 2017-02-18 MED FILL — TACROLIMUS/0.5MG/CAPS: TACROLIMUS/0.5MG/CAPS | 90 days supply | Qty: 90 | Fill #0

## 2017-02-19 MED FILL — ITRACONAZOLE/100MG/CAPS: ITRACONAZOLE/100MG/CAPS | 90 days supply | Qty: 360 | Fill #0

## 2017-02-20 ENCOUNTER — Encounter: Admit: 2017-02-20 | Discharge: 2017-02-21 | Payer: PRIVATE HEALTH INSURANCE

## 2017-02-20 ENCOUNTER — Ambulatory Visit
Admit: 2017-02-20 | Discharge: 2017-02-21 | Payer: PRIVATE HEALTH INSURANCE | Attending: Internal Medicine | Primary: Internal Medicine

## 2017-02-20 DIAGNOSIS — T86818 Other complications of lung transplant: Secondary | ICD-10-CM

## 2017-02-20 DIAGNOSIS — Z942 Lung transplant status: Principal | ICD-10-CM

## 2017-02-20 DIAGNOSIS — J349 Unspecified disorder of nose and nasal sinuses: Principal | ICD-10-CM

## 2017-02-20 MED ORDER — MYCOPHENOLATE MOFETIL 250 MG CAPSULE
ORAL_CAPSULE | Freq: Two times a day (BID) | ORAL | 3 refills | 0.00000 days | Status: CP
Start: 2017-02-20 — End: 2017-08-19

## 2017-02-20 MED ORDER — MYCOPHENOLATE MOFETIL 250 MG CAPSULE: 750 mg | capsule | Freq: Two times a day (BID) | 3 refills | 0 days | Status: AC

## 2017-02-22 MED ORDER — OSELTAMIVIR 75 MG CAPSULE
ORAL_CAPSULE | Freq: Every day | ORAL | 0 refills | 0.00000 days | Status: CP
Start: 2017-02-22 — End: 2017-06-28

## 2017-02-22 MED ORDER — OSELTAMIVIR 75 MG CAPSULE: 75 mg | capsule | 3 refills | 0 days

## 2017-02-22 MED FILL — OSELTAMIVIR PHOSPHATE/75MG/CAPS: OSELTAMIVIR PHOSPHATE/75MG/CAPS | 20 days supply | Qty: 20 | Fill #0

## 2017-02-26 MED ORDER — AZITHROMYCIN 250 MG TABLET: 250 mg | tablet | 3 refills | 0 days

## 2017-02-26 MED ORDER — AZITHROMYCIN 250 MG TABLET
ORAL_TABLET | Freq: Every day | ORAL | 3 refills | 0.00000 days | Status: CP
Start: 2017-02-26 — End: 2017-11-25

## 2017-02-26 MED ORDER — AZITHROMYCIN 250 MG TABLET: 250 mg | tablet | Freq: Every day | 3 refills | 0 days | Status: AC

## 2017-02-26 MED FILL — AZITHROMYCIN/250MG/TABS: AZITHROMYCIN/250MG/TABS | 90 days supply | Qty: 90 | Fill #0

## 2017-03-08 MED FILL — CYPROHEPTADINE HCL/4MG/TABS: CYPROHEPTADINE HCL/4MG/TABS | 30 days supply | Qty: 30 | Fill #3

## 2017-03-08 MED FILL — VENTOLIN HFA/90MCG/AER: VENTOLIN HFA/90MCG/AER | 25 days supply | Qty: 1 | Fill #4

## 2017-03-08 MED FILL — BD 3mL/25G 5/8 INCH/SYR: BD 3mL/25G 5/8 INCH/SYR | 30 days supply | Qty: 60 | Fill #1

## 2017-03-12 ENCOUNTER — Encounter: Admit: 2017-03-12 | Discharge: 2017-03-13 | Payer: PRIVATE HEALTH INSURANCE

## 2017-03-12 ENCOUNTER — Encounter
Admit: 2017-03-12 | Discharge: 2017-03-13 | Payer: PRIVATE HEALTH INSURANCE | Attending: Internal Medicine | Primary: Internal Medicine

## 2017-03-12 DIAGNOSIS — Z48280 Encounter for aftercare following heart-lung transplant: Principal | ICD-10-CM

## 2017-03-12 DIAGNOSIS — Z942 Lung transplant status: Principal | ICD-10-CM

## 2017-03-12 DIAGNOSIS — Z23 Encounter for immunization: Secondary | ICD-10-CM

## 2017-03-12 MED ORDER — MEROPENEM 1 GRAM/50 ML IN 0.9% SODIUM CHLORIDE INTRAVENOUS PIGGYBACK
Freq: Three times a day (TID) | INTRAVENOUS | 0 refills | 0 days | Status: CP
Start: 2017-03-12 — End: 2017-04-02

## 2017-03-19 ENCOUNTER — Encounter: Admit: 2017-03-19 | Discharge: 2017-03-19 | Payer: PRIVATE HEALTH INSURANCE

## 2017-03-19 DIAGNOSIS — Z942 Lung transplant status: Principal | ICD-10-CM

## 2017-03-21 MED ORDER — DOXYCYCLINE HYCLATE 100 MG TABLET: 100 mg | tablet | 0 refills | 0 days

## 2017-03-21 MED ORDER — ALENDRONATE 70 MG TABLET
ORAL_TABLET | ORAL | 3 refills | 0.00000 days | Status: CP
Start: 2017-03-21 — End: 2017-03-21

## 2017-03-21 MED ORDER — ALENDRONATE 70 MG TABLET: 70 mg | tablet | 3 refills | 0 days

## 2017-03-21 MED ORDER — DOXYCYCLINE HYCLATE 100 MG TABLET
ORAL_TABLET | Freq: Two times a day (BID) | ORAL | 0 refills | 0.00000 days | Status: CP
Start: 2017-03-21 — End: 2017-09-19

## 2017-03-21 MED FILL — ALENDRONATE SODIUM/70MG/TABS: ALENDRONATE SODIUM/70MG/TABS | 84 days supply | Qty: 3 | Fill #0

## 2017-03-21 MED FILL — DOXYCYCLINE HYCLATE/100MG/TABS: DOXYCYCLINE HYCLATE/100MG/TABS | 14 days supply | Qty: 28 | Fill #0

## 2017-03-25 MED FILL — OSELTAMIVIR PHOSPHATE/75MG/CAPS: OSELTAMIVIR PHOSPHATE/75MG/CAPS | 40 days supply | Qty: 40 | Fill #2

## 2017-03-27 ENCOUNTER — Ambulatory Visit: Admit: 2017-03-27 | Discharge: 2017-03-27 | Payer: PRIVATE HEALTH INSURANCE

## 2017-03-27 ENCOUNTER — Encounter: Admit: 2017-03-27 | Discharge: 2017-03-27 | Payer: PRIVATE HEALTH INSURANCE

## 2017-03-27 DIAGNOSIS — Z942 Lung transplant status: Secondary | ICD-10-CM

## 2017-03-27 DIAGNOSIS — D801 Nonfamilial hypogammaglobulinemia: Secondary | ICD-10-CM

## 2017-03-27 DIAGNOSIS — Z4824 Encounter for aftercare following lung transplant: Principal | ICD-10-CM

## 2017-03-29 MED ORDER — PANTOPRAZOLE 40 MG TABLET,DELAYED RELEASE
ORAL_TABLET | ORAL | 3 refills | 0.00000 days | Status: CP
Start: 2017-03-29 — End: 2017-04-09

## 2017-03-29 MED ORDER — PANTOPRAZOLE 40 MG TABLET,DELAYED RELEASE: tablet | 3 refills | 0 days | Status: AC

## 2017-04-08 ENCOUNTER — Non-Acute Institutional Stay: Admit: 2017-04-08 | Discharge: 2017-04-09 | Payer: PRIVATE HEALTH INSURANCE

## 2017-04-08 ENCOUNTER — Encounter: Admit: 2017-04-08 | Discharge: 2017-04-09 | Payer: PRIVATE HEALTH INSURANCE

## 2017-04-08 ENCOUNTER — Encounter: Admit: 2017-04-08 | Discharge: 2017-04-09 | Payer: PRIVATE HEALTH INSURANCE | Attending: Family | Primary: Family

## 2017-04-08 DIAGNOSIS — R05 Cough: Secondary | ICD-10-CM

## 2017-04-08 DIAGNOSIS — Z4824 Encounter for aftercare following lung transplant: Principal | ICD-10-CM

## 2017-04-08 DIAGNOSIS — Z942 Lung transplant status: Principal | ICD-10-CM

## 2017-04-08 DIAGNOSIS — R093 Abnormal sputum: Secondary | ICD-10-CM

## 2017-04-08 MED ORDER — WATER FOR INJECTION, STERILE INJECTION SOLUTION
11 refills | 0 days | Status: CP
Start: 2017-04-08 — End: 2017-06-28

## 2017-04-08 MED ORDER — SYRINGE WITH NEEDLE 3 ML 20 GAUGE X 1"
INJECTION | 11 refills | 0 days | Status: CP
Start: 2017-04-08 — End: 2017-06-28

## 2017-04-08 MED ORDER — ALCOHOL SWABS: each | 0 refills | 0 days | Status: AC

## 2017-04-08 MED ORDER — SYRINGE WITH NEEDLE 3 ML 20 GAUGE X 1 1/2"
80 refills | 0 days
Start: 2017-04-08 — End: 2017-09-20

## 2017-04-08 MED ORDER — COLISTIN (COLISTIMETHATE SODIUM) 150 MG SOLUTION FOR INJECTION
Freq: Two times a day (BID) | RESPIRATORY_TRACT | 0 refills | 0.00000 days | Status: CP
Start: 2017-04-08 — End: 2017-06-28

## 2017-04-08 MED ORDER — COLISTIN (COLISTIMETHATE SODIUM) 150 MG SOLUTION FOR INJECTION: each | 0 refills | 0 days

## 2017-04-08 MED ORDER — ALCOHOL SWABS
99 refills | 0.00000 days | Status: CP
Start: 2017-04-08 — End: 2017-04-08

## 2017-04-08 MED FILL — COLISTIMETHATE SODIUM/150MG/SOLR: COLISTIMETHATE SODIUM/150MG/SOLR | 30 days supply | Qty: 60 | Fill #0

## 2017-04-08 MED FILL — STERILE WATER INJ//10ML: STERILE WATER INJ//10ML | 60 days supply | Qty: 60 | Fill #0

## 2017-04-08 MED FILL — GNP ALCOHOL SWABS//PADS: GNP ALCOHOL SWABS//PADS | 50 days supply | Qty: 1 | Fill #0

## 2017-04-08 MED FILL — BD 3ML LUER-LOK SYRINGE 2/20GX1.5/MISC: BD 3ML LUER-LOK SYRINGE 2/20GX1.5/MISC | 29 days supply | Qty: 59 | Fill #0

## 2017-04-09 MED ORDER — PANTOPRAZOLE 40 MG TABLET,DELAYED RELEASE: 40 mg | tablet | Freq: Every day | 3 refills | 0 days | Status: AC

## 2017-04-09 MED ORDER — PANTOPRAZOLE 40 MG TABLET,DELAYED RELEASE
ORAL_TABLET | Freq: Every day | ORAL | 3 refills | 0.00000 days | Status: CP
Start: 2017-04-09 — End: 2018-04-08

## 2017-04-09 MED ORDER — CYPROHEPTADINE 4 MG TABLET
ORAL_TABLET | Freq: Every evening | ORAL | 3 refills | 0.00000 days | Status: CP
Start: 2017-04-09 — End: 2017-04-09

## 2017-04-09 MED ORDER — CYPROHEPTADINE 4 MG TABLET: 4 mg | tablet | 3 refills | 0 days | Status: AC

## 2017-04-09 MED FILL — PANTOPRAZOLE/40MG/TBEC: PANTOPRAZOLE/40MG/TBEC | 90 days supply | Qty: 90 | Fill #0

## 2017-04-09 MED FILL — CYPROHEPTADINE HCL/4MG/TABS: CYPROHEPTADINE HCL/4MG/TABS | 90 days supply | Qty: 90 | Fill #0

## 2017-04-15 ENCOUNTER — Ambulatory Visit: Admit: 2017-04-15 | Discharge: 2017-04-15 | Payer: PRIVATE HEALTH INSURANCE

## 2017-04-15 ENCOUNTER — Encounter: Admit: 2017-04-15 | Discharge: 2017-04-15 | Payer: PRIVATE HEALTH INSURANCE

## 2017-04-15 DIAGNOSIS — Z942 Lung transplant status: Principal | ICD-10-CM

## 2017-04-15 DIAGNOSIS — R942 Abnormal results of pulmonary function studies: Principal | ICD-10-CM

## 2017-04-15 DIAGNOSIS — Q348 Other specified congenital malformations of respiratory system: Secondary | ICD-10-CM

## 2017-04-24 MED FILL — TACROLIMUS/1MG/CAPS: TACROLIMUS/1MG/CAPS | 70 days supply | Qty: 210 | Fill #3

## 2017-04-24 MED FILL — OSELTAMIVIR PHOSPHATE/75MG/CAPS: OSELTAMIVIR PHOSPHATE/75MG/CAPS | 30 days supply | Qty: 30 | Fill #3

## 2017-04-24 MED FILL — VENTOLIN HFA/90MCG/AER: VENTOLIN HFA/90MCG/AER | 25 days supply | Qty: 1 | Fill #5

## 2017-05-20 ENCOUNTER — Encounter: Admit: 2017-05-20 | Discharge: 2017-05-21 | Payer: PRIVATE HEALTH INSURANCE

## 2017-05-20 DIAGNOSIS — Z942 Lung transplant status: Principal | ICD-10-CM

## 2017-05-20 MED FILL — VENTOLIN HFA/90MCG/AER: VENTOLIN HFA/90MCG/AER | 25 days supply | Qty: 1 | Fill #6

## 2017-05-20 MED FILL — BD 3ML LUER-LOK SYRINGE 2/20GX1.5/MISC: BD 3ML LUER-LOK SYRINGE 2/20GX1.5/MISC | 29 days supply | Qty: 60 | Fill #1

## 2017-05-20 MED FILL — ITRACONAZOLE/100MG/CAPS: ITRACONAZOLE/100MG/CAPS | 90 days supply | Qty: 360 | Fill #1

## 2017-05-20 MED FILL — AZITHROMYCIN/250MG/TABS: AZITHROMYCIN/250MG/TABS | 60 days supply | Qty: 60 | Fill #1

## 2017-05-20 MED FILL — AMIKACIN SULFATE/500/2ML/SOLN: AMIKACIN SULFATE/500/2ML/SOLN | 30 days supply | Qty: 60 | Fill #1

## 2017-06-04 MED ORDER — SODIUM CHLORIDE 0.9 % FOR NEBULIZATION: 3 mL | mL | Freq: Four times a day (QID) | 12 refills | 0 days | Status: AC

## 2017-06-04 MED ORDER — SODIUM CHLORIDE 0.9 % FOR NEBULIZATION
Freq: Four times a day (QID) | RESPIRATORY_TRACT | 12 refills | 0.00000 days | Status: CP | PRN
Start: 2017-06-04 — End: 2017-06-04

## 2017-06-04 MED FILL — SODIUM CHLORIDE 0.9% INH 30X3/0.9%/NEBU: SODIUM CHLORIDE 0.9% INH 30X3/0.9%/NEBU | 7 days supply | Qty: 90 | Fill #0

## 2017-06-28 ENCOUNTER — Encounter
Admit: 2017-06-28 | Discharge: 2017-06-29 | Payer: PRIVATE HEALTH INSURANCE | Attending: Internal Medicine | Primary: Internal Medicine

## 2017-06-28 ENCOUNTER — Encounter: Admit: 2017-06-28 | Discharge: 2017-06-29 | Payer: PRIVATE HEALTH INSURANCE

## 2017-06-28 DIAGNOSIS — L821 Other seborrheic keratosis: Secondary | ICD-10-CM

## 2017-06-28 DIAGNOSIS — D485 Neoplasm of uncertain behavior of skin: Secondary | ICD-10-CM

## 2017-06-28 DIAGNOSIS — Q828 Other specified congenital malformations of skin: Secondary | ICD-10-CM

## 2017-06-28 DIAGNOSIS — Z942 Lung transplant status: Principal | ICD-10-CM

## 2017-06-28 DIAGNOSIS — D2239 Melanocytic nevi of other parts of face: Secondary | ICD-10-CM

## 2017-06-28 DIAGNOSIS — B259 Cytomegaloviral disease, unspecified: Secondary | ICD-10-CM

## 2017-06-28 DIAGNOSIS — L739 Follicular disorder, unspecified: Principal | ICD-10-CM

## 2017-06-28 MED ORDER — ASPIRIN 81 MG TABLET,DELAYED RELEASE
ORAL_TABLET | Freq: Every day | ORAL | 2 refills | 0.00000 days | Status: CP
Start: 2017-06-28 — End: 2017-06-28

## 2017-06-28 MED ORDER — CALCIUM CARBONATE 600 MG (1,500 MG)-VITAMIN D3 400 UNIT TABLET
ORAL | 11 refills | 0 days
Start: 2017-06-28 — End: 2018-01-13

## 2017-06-28 MED ORDER — KETOCONAZOLE 2 % SHAMPOO: mL | 3 refills | 0 days | Status: AC

## 2017-06-28 MED ORDER — ASPIRIN 81 MG TABLET,DELAYED RELEASE: 81 mg | tablet | Freq: Every day | 2 refills | 0 days | Status: AC

## 2017-06-28 MED ORDER — AMIKACIN 2% OPHTHALMIC SOLN
11 refills | 0 days
Start: 2017-06-28 — End: 2017-09-19

## 2017-06-28 MED ORDER — ATORVASTATIN 20 MG TABLET
ORAL_TABLET | Freq: Every day | ORAL | 11 refills | 0 days | Status: CP
Start: 2017-06-28 — End: 2018-07-28
  Filled 2017-09-25: qty 30, 30d supply, fill #0

## 2017-06-28 MED ORDER — VALGANCICLOVIR 450 MG TABLET
ORAL_TABLET | Freq: Every day | ORAL | 11 refills | 0.00000 days
Start: 2017-06-28 — End: 2017-10-07

## 2017-06-28 MED ORDER — HYDROCHLOROTHIAZIDE 12.5 MG TABLET: tablet | 11 refills | 0 days | Status: AC

## 2017-06-28 MED ORDER — MAGNESIUM OXIDE 400 MG (241.3 MG MAGNESIUM) TABLET
ORAL_TABLET | Freq: Two times a day (BID) | ORAL | 11 refills | 0.00000 days | Status: CP
Start: 2017-06-28 — End: 2018-06-28

## 2017-06-28 MED ORDER — CALCIUM CARBONATE-VITAMIN D3 600 MG (1,500 MG)-800 UNIT TABLET
ORAL_TABLET | Freq: Two times a day (BID) | ORAL | 11 refills | 0 days | Status: CP
Start: 2017-06-28 — End: 2017-09-19

## 2017-06-28 MED ORDER — HYDROCHLOROTHIAZIDE 12.5 MG TABLET
ORAL_TABLET | Freq: Every day | ORAL | 11 refills | 0.00000 days | Status: CP
Start: 2017-06-28 — End: 2018-06-23
  Filled 2017-09-25: qty 30, 30d supply, fill #0

## 2017-06-28 MED ORDER — CLINDAMYCIN 1 %-BENZOYL PEROXIDE 5 % TOPICAL GEL
5 refills | 0 days | Status: CP
Start: 2017-06-28 — End: 2018-02-03

## 2017-06-28 MED ORDER — PREDNISONE 2.5 MG TABLET
ORAL_TABLET | Freq: Every day | ORAL | 11 refills | 0.00000 days | Status: CP
Start: 2017-06-28 — End: 2017-07-04

## 2017-06-28 MED ORDER — KETOCONAZOLE 2 % SHAMPOO
TOPICAL | 3 refills | 0.00000 days | Status: CP
Start: 2017-06-28 — End: 2018-06-28

## 2017-06-28 MED ORDER — PREDNISONE 2.5 MG TABLET: each | 11 refills | 0 days

## 2017-06-28 MED ORDER — FUROSEMIDE 20 MG TABLET
ORAL_TABLET | Freq: Every day | ORAL | 2 refills | 0.00000 days | Status: CP
Start: 2017-06-28 — End: 2017-11-25

## 2017-06-28 MED FILL — MAGNESIUM OXIDE/400MG/TABS: MAGNESIUM OXIDE/400MG/TABS | 30 days supply | Qty: 120 | Fill #0

## 2017-06-28 MED FILL — KETOCONAZOLE SHAMP/2%/SHA: KETOCONAZOLE SHAMP/2%/SHA | 30 days supply | Qty: 1 | Fill #0

## 2017-06-28 MED FILL — ATORVASTATIN/20MG/TABS: ATORVASTATIN/20MG/TABS | 30 days supply | Qty: 30 | Fill #0

## 2017-06-28 MED FILL — HYDROCHLOROTHIAZIDE/12.5MG/TABS: HYDROCHLOROTHIAZIDE/12.5MG/TABS | 30 days supply | Qty: 30 | Fill #0

## 2017-06-28 MED FILL — CALCIUM 600/800 D//TABS: CALCIUM 600/800 D//TABS | 30 days supply | Qty: 60 | Fill #0

## 2017-06-28 MED FILL — PREDNISONE/2.5MG/TABS: PREDNISONE/2.5MG/TABS | 30 days supply | Qty: 90 | Fill #0

## 2017-06-28 MED FILL — GNP ASPIRIN LOW DOSE/81MG EC/TBEC: GNP ASPIRIN LOW DOSE/81MG EC/TBEC | 150 days supply | Qty: 150 | Fill #0

## 2017-06-28 MED FILL — CLINDAMYCIN/BENZOYL PEROXIDE/1%-5%/GEL: CLINDAMYCIN/BENZOYL PEROXIDE/1%-5%/GEL | 30 days supply | Qty: 1 | Fill #0

## 2017-07-03 MED FILL — VENTOLIN HFA/90MCG/AER: VENTOLIN HFA/90MCG/AER | 25 days supply | Qty: 1 | Fill #7

## 2017-07-04 MED ORDER — PREDNISONE 5 MG TABLET: tablet | 3 refills | 0 days | Status: AC

## 2017-07-04 MED ORDER — PREDNISONE 5 MG TABLET
ORAL_TABLET | Freq: Every day | ORAL | 3 refills | 0.00000 days | Status: CP
Start: 2017-07-04 — End: 2018-06-23

## 2017-07-04 MED ORDER — PREDNISONE 2.5 MG TABLET: 3 mg | tablet | Freq: Every day | 3 refills | 0 days | Status: AC

## 2017-07-04 MED ORDER — PREDNISONE 2.5 MG TABLET
Freq: Every day | ORAL | 3 refills | 0.00000 days | Status: CP
Start: 2017-07-04 — End: 2018-08-26

## 2017-07-04 MED FILL — AZITHROMYCIN/250MG/TABS: AZITHROMYCIN/250MG/TABS | 60 days supply | Qty: 60 | Fill #2

## 2017-07-04 MED FILL — TACROLIMUS/1MG/CAPS: TACROLIMUS/1MG/CAPS | 70 days supply | Qty: 210 | Fill #4

## 2017-07-04 MED FILL — PREDNISONE/5MG/TAB: PREDNISONE/5MG/TAB | 90 days supply | Qty: 90 | Fill #0

## 2017-07-09 MED FILL — AMIKACIN SULFATE/500/2ML/SOLN: AMIKACIN SULFATE/500/2ML/SOLN | 30 days supply | Qty: 60 | Fill #2

## 2017-07-09 MED FILL — PANTOPRAZOLE/40MG/TBEC: PANTOPRAZOLE/40MG/TBEC | 90 days supply | Qty: 90 | Fill #1

## 2017-07-09 MED FILL — CYPROHEPTADINE HCL/4MG/TABS: CYPROHEPTADINE HCL/4MG/TABS | 90 days supply | Qty: 90 | Fill #1

## 2017-07-18 MED ORDER — TACROLIMUS 0.5 MG CAPSULE: 1 mg | capsule | Freq: Every day | 11 refills | 0 days | Status: AC

## 2017-07-18 MED ORDER — TACROLIMUS 0.5 MG CAPSULE
ORAL_CAPSULE | Freq: Every day | ORAL | 3 refills | 0.00000 days | Status: CP
Start: 2017-07-18 — End: 2017-07-18

## 2017-07-25 MED ORDER — TACROLIMUS 1 MG CAPSULE
ORAL_CAPSULE | Freq: Two times a day (BID) | ORAL | 3 refills | 0 days | Status: CP
Start: 2017-07-25 — End: 2017-08-12

## 2017-07-29 ENCOUNTER — Encounter: Admit: 2017-07-29 | Discharge: 2017-07-30 | Payer: PRIVATE HEALTH INSURANCE

## 2017-07-29 DIAGNOSIS — Z942 Lung transplant status: Principal | ICD-10-CM

## 2017-07-29 MED FILL — HYDROCHLOROTHIAZIDE/12.5MG/TABS: HYDROCHLOROTHIAZIDE/12.5MG/TABS | 30 days supply | Qty: 30 | Fill #1

## 2017-07-29 MED FILL — MAG OXIDE (PER TABLET)/400MG/TAB: MAG OXIDE (PER TABLET)/400MG/TAB | 30 days supply | Qty: 120 | Fill #1

## 2017-07-31 MED FILL — ATORVASTATIN/20MG/TABS: ATORVASTATIN/20MG/TABS | 30 days supply | Qty: 30 | Fill #1

## 2017-08-01 ENCOUNTER — Encounter: Admit: 2017-08-01 | Discharge: 2017-08-02 | Payer: PRIVATE HEALTH INSURANCE

## 2017-08-01 DIAGNOSIS — Z942 Lung transplant status: Principal | ICD-10-CM

## 2017-08-09 ENCOUNTER — Ambulatory Visit: Admit: 2017-08-09 | Discharge: 2017-08-10 | Payer: PRIVATE HEALTH INSURANCE

## 2017-08-09 DIAGNOSIS — Z942 Lung transplant status: Principal | ICD-10-CM

## 2017-08-12 MED ORDER — TACROLIMUS 1 MG CAPSULE
ORAL_CAPSULE | 3 refills | 0 days | Status: CP
Start: 2017-08-12 — End: 2017-08-20

## 2017-08-12 MED FILL — CALCIUM 600/800 D//TABS: CALCIUM 600/800 D//TABS | 30 days supply | Qty: 60 | Fill #1

## 2017-08-12 MED FILL — AMIKACIN SULFATE/500/2ML/SOLN: AMIKACIN SULFATE/500/2ML/SOLN | 30 days supply | Qty: 60 | Fill #3

## 2017-08-19 ENCOUNTER — Encounter: Admit: 2017-08-19 | Discharge: 2017-08-20 | Payer: PRIVATE HEALTH INSURANCE

## 2017-08-19 DIAGNOSIS — Z942 Lung transplant status: Principal | ICD-10-CM

## 2017-08-19 MED ORDER — MYCOPHENOLATE MOFETIL 250 MG CAPSULE
ORAL_CAPSULE | Freq: Two times a day (BID) | ORAL | 3 refills | 0.00000 days | Status: CP
Start: 2017-08-19 — End: 2017-12-31

## 2017-08-19 MED ORDER — MYCOPHENOLATE MOFETIL 500 MG TABLET
ORAL_TABLET | Freq: Two times a day (BID) | ORAL | 3 refills | 0.00000 days | Status: CP
Start: 2017-08-19 — End: 2017-12-31

## 2017-08-20 MED ORDER — TACROLIMUS 1 MG CAPSULE
ORAL_CAPSULE | ORAL | 3 refills | 0 days | Status: CP
Start: 2017-08-20 — End: 2017-08-26

## 2017-08-22 ENCOUNTER — Encounter
Admit: 2017-08-22 | Discharge: 2017-08-23 | Payer: PRIVATE HEALTH INSURANCE | Attending: Dermatology | Primary: Dermatology

## 2017-08-22 DIAGNOSIS — L738 Other specified follicular disorders: Secondary | ICD-10-CM

## 2017-08-22 DIAGNOSIS — R21 Rash and other nonspecific skin eruption: Principal | ICD-10-CM

## 2017-08-23 MED FILL — PREDNISONE/2.5MG/TABS: PREDNISONE/2.5MG/TABS | 30 days supply | Qty: 90 | Fill #1

## 2017-08-23 MED FILL — HYDROCHLOROTHIAZIDE/12.5MG/TABS: HYDROCHLOROTHIAZIDE/12.5MG/TABS | 30 days supply | Qty: 30 | Fill #2

## 2017-08-26 ENCOUNTER — Encounter: Admit: 2017-08-26 | Discharge: 2017-08-27 | Payer: PRIVATE HEALTH INSURANCE

## 2017-08-26 DIAGNOSIS — Z942 Lung transplant status: Principal | ICD-10-CM

## 2017-08-26 MED ORDER — TACROLIMUS 1 MG CAPSULE
ORAL_CAPSULE | ORAL | 3 refills | 0 days | Status: CP
Start: 2017-08-26 — End: 2017-09-05

## 2017-08-30 MED FILL — ATORVASTATIN/20MG/TABS: ATORVASTATIN/20MG/TABS | 30 days supply | Qty: 30 | Fill #2

## 2017-09-02 ENCOUNTER — Ambulatory Visit: Admit: 2017-09-02 | Discharge: 2017-09-03 | Payer: PRIVATE HEALTH INSURANCE

## 2017-09-02 DIAGNOSIS — Z942 Lung transplant status: Principal | ICD-10-CM

## 2017-09-05 MED ORDER — TACROLIMUS 5 MG CAPSULE: capsule | 3 refills | 0 days | Status: AC

## 2017-09-05 MED ORDER — ALBUTEROL SULFATE HFA 90 MCG/ACTUATION AEROSOL INHALER: 2 | Inhaler | Freq: Four times a day (QID) | 11 refills | 0 days | Status: AC

## 2017-09-05 MED ORDER — TACROLIMUS 1 MG CAPSULE
Freq: Two times a day (BID) | ORAL | 3 refills | 0.00000 days | Status: CP
Start: 2017-09-05 — End: 2018-01-27

## 2017-09-05 MED ORDER — TACROLIMUS 5 MG CAPSULE
ORAL_CAPSULE | Freq: Two times a day (BID) | ORAL | 3 refills | 0.00000 days | Status: CP
Start: 2017-09-05 — End: 2017-09-05

## 2017-09-05 MED ORDER — TACROLIMUS 1 MG CAPSULE: 1 mg | capsule | Freq: Two times a day (BID) | 3 refills | 0 days | Status: AC

## 2017-09-05 MED ORDER — ALBUTEROL SULFATE HFA 90 MCG/ACTUATION AEROSOL INHALER
Freq: Four times a day (QID) | RESPIRATORY_TRACT | 11 refills | 0.00000 days | Status: CP | PRN
Start: 2017-09-05 — End: 2017-09-05
  Filled 2017-11-01: qty 18, 16d supply, fill #0

## 2017-09-05 MED FILL — VENTOLIN HFA/90MCG/AER: VENTOLIN HFA/90MCG/AER | 25 days supply | Qty: 1 | Fill #0

## 2017-09-09 ENCOUNTER — Encounter
Admit: 2017-09-09 | Discharge: 2017-09-10 | Payer: PRIVATE HEALTH INSURANCE | Attending: Otolaryngology | Primary: Otolaryngology

## 2017-09-09 ENCOUNTER — Encounter: Admit: 2017-09-09 | Discharge: 2017-09-10 | Payer: PRIVATE HEALTH INSURANCE

## 2017-09-09 DIAGNOSIS — H6123 Impacted cerumen, bilateral: Principal | ICD-10-CM

## 2017-09-09 DIAGNOSIS — H6983 Other specified disorders of Eustachian tube, bilateral: Secondary | ICD-10-CM

## 2017-09-09 DIAGNOSIS — H9190 Unspecified hearing loss, unspecified ear: Principal | ICD-10-CM

## 2017-09-09 DIAGNOSIS — H906 Mixed conductive and sensorineural hearing loss, bilateral: Secondary | ICD-10-CM

## 2017-09-13 ENCOUNTER — Non-Acute Institutional Stay: Admit: 2017-09-13 | Discharge: 2017-09-14 | Payer: PRIVATE HEALTH INSURANCE

## 2017-09-13 DIAGNOSIS — Z942 Lung transplant status: Principal | ICD-10-CM

## 2017-09-16 MED ORDER — AMIKACIN 500 MG/2 ML INJECTION SOLUTION: mL | 5 refills | 0 days | Status: AC

## 2017-09-16 MED ORDER — AMIKACIN 500 MG/2 ML INJECTION SOLUTION
INTRAMUSCULAR | 5 refills | 0.00000 days | Status: CP
Start: 2017-09-16 — End: 2017-09-19
  Filled 2017-10-24: qty 60, 30d supply, fill #0

## 2017-09-20 ENCOUNTER — Encounter: Admit: 2017-09-20 | Discharge: 2017-09-21 | Payer: PRIVATE HEALTH INSURANCE

## 2017-09-20 ENCOUNTER — Ambulatory Visit: Admit: 2017-09-20 | Discharge: 2017-09-21 | Payer: PRIVATE HEALTH INSURANCE

## 2017-09-20 ENCOUNTER — Encounter
Admit: 2017-09-20 | Discharge: 2017-09-21 | Payer: PRIVATE HEALTH INSURANCE | Attending: Audiologist | Primary: Audiologist

## 2017-09-20 DIAGNOSIS — W108XXA Fall (on) (from) other stairs and steps, initial encounter: Secondary | ICD-10-CM

## 2017-09-20 DIAGNOSIS — Z942 Lung transplant status: Principal | ICD-10-CM

## 2017-09-20 DIAGNOSIS — H906 Mixed conductive and sensorineural hearing loss, bilateral: Principal | ICD-10-CM

## 2017-09-20 MED FILL — AMIKACIN SULFATE/500MG/2ML(250/SOLN: AMIKACIN SULFATE/500MG/2ML(250/SOLN | 30 days supply | Qty: 60 | Fill #0

## 2017-09-20 MED FILL — AZITHROMYCIN/250MG/TAB: AZITHROMYCIN/250MG/TAB | 60 days supply | Qty: 60 | Fill #3

## 2017-09-25 MED FILL — ATORVASTATIN 20 MG TABLET: 30 days supply | Qty: 30 | Fill #0 | Status: AC

## 2017-09-25 MED FILL — HYDROCHLOROTHIAZIDE 12.5 MG TABLET: 30 days supply | Qty: 30 | Fill #0 | Status: AC

## 2017-10-03 ENCOUNTER — Encounter: Admit: 2017-10-03 | Discharge: 2017-10-04 | Payer: PRIVATE HEALTH INSURANCE

## 2017-10-03 DIAGNOSIS — Z942 Lung transplant status: Principal | ICD-10-CM

## 2017-10-03 MED FILL — FUROSEMIDE 20 MG TABLET: ORAL | 90 days supply | Qty: 90 | Fill #0

## 2017-10-03 MED FILL — PREDNISONE 2.5 MG TABLET: 90 days supply | Qty: 90 | Fill #0

## 2017-10-03 MED FILL — PREDNISONE 5 MG TABLET: 90 days supply | Qty: 90 | Fill #0

## 2017-10-03 MED FILL — FUROSEMIDE 20 MG TABLET: 90 days supply | Qty: 90 | Fill #0 | Status: AC

## 2017-10-03 MED FILL — PREDNISONE 5 MG TABLET: 90 days supply | Qty: 90 | Fill #0 | Status: AC

## 2017-10-03 MED FILL — PREDNISONE 2.5 MG TABLET: 90 days supply | Qty: 90 | Fill #0 | Status: AC

## 2017-10-07 MED ORDER — VALGANCICLOVIR 450 MG TABLET
ORAL_TABLET | Freq: Every day | ORAL | 3 refills | 0.00000 days | Status: CP
Start: 2017-10-07 — End: 2017-12-24

## 2017-10-11 MED FILL — CYPROHEPTADINE 4 MG TABLET: ORAL | 90 days supply | Qty: 90 | Fill #0

## 2017-10-11 MED FILL — PANTOPRAZOLE 40 MG TABLET,DELAYED RELEASE: 90 days supply | Qty: 90 | Fill #0 | Status: AC

## 2017-10-11 MED FILL — CYPROHEPTADINE 4 MG TABLET: 90 days supply | Qty: 90 | Fill #0 | Status: AC

## 2017-10-11 MED FILL — PANTOPRAZOLE 40 MG TABLET,DELAYED RELEASE: 90 days supply | Qty: 90 | Fill #0

## 2017-10-17 ENCOUNTER — Encounter: Admit: 2017-10-17 | Discharge: 2017-10-18 | Payer: PRIVATE HEALTH INSURANCE

## 2017-10-17 DIAGNOSIS — R7989 Other specified abnormal findings of blood chemistry: Secondary | ICD-10-CM

## 2017-10-17 DIAGNOSIS — Z942 Lung transplant status: Principal | ICD-10-CM

## 2017-10-24 MED ORDER — SYRINGE WITH NEEDLE 3 ML 20 GAUGE X 1 1/2": each | 9 refills | 0 days

## 2017-10-24 MED FILL — AMIKACIN 500 MG/2 ML INJECTION SOLUTION: 30 days supply | Qty: 60 | Fill #0 | Status: AC

## 2017-10-24 MED FILL — BD LUER-LOK SYRINGE 3 ML 20 GAUGE X 1 1/2": 30 days supply | Qty: 60 | Fill #0

## 2017-10-24 MED FILL — HYDROCHLOROTHIAZIDE 12.5 MG TABLET: 30 days supply | Qty: 30 | Fill #1 | Status: AC

## 2017-10-24 MED FILL — HYDROCHLOROTHIAZIDE 12.5 MG TABLET: ORAL | 30 days supply | Qty: 30 | Fill #1

## 2017-10-24 MED FILL — ATORVASTATIN 20 MG TABLET: 30 days supply | Qty: 30 | Fill #1 | Status: AC

## 2017-10-24 MED FILL — BD LUER-LOK SYRINGE 3 ML 20 GAUGE X 1 1/2": 30 days supply | Qty: 60 | Fill #0 | Status: AC

## 2017-10-24 MED FILL — ATORVASTATIN 20 MG TABLET: ORAL | 30 days supply | Qty: 30 | Fill #1

## 2017-10-26 ENCOUNTER — Encounter: Admit: 2017-10-26 | Discharge: 2017-10-27 | Payer: PRIVATE HEALTH INSURANCE

## 2017-10-26 DIAGNOSIS — Z942 Lung transplant status: Principal | ICD-10-CM

## 2017-11-01 MED FILL — VENTOLIN HFA 90 MCG/ACTUATION AEROSOL INHALER: 16 days supply | Qty: 18 | Fill #0 | Status: AC

## 2017-11-22 ENCOUNTER — Encounter: Admit: 2017-11-22 | Discharge: 2017-11-23 | Payer: PRIVATE HEALTH INSURANCE

## 2017-11-22 DIAGNOSIS — Z942 Lung transplant status: Secondary | ICD-10-CM

## 2017-11-22 MED FILL — MYCOPHENOLATE MOFETIL 250 MG CAPSULE: 30 days supply | Qty: 60 | Fill #0 | Status: AC

## 2017-11-22 MED FILL — MYCOPHENOLATE MOFETIL 250 MG CAPSULE: ORAL | 30 days supply | Qty: 60 | Fill #0

## 2017-11-22 MED FILL — MYCOPHENOLATE MOFETIL 500 MG TABLET: ORAL | 30 days supply | Qty: 60 | Fill #0

## 2017-11-22 MED FILL — MYCOPHENOLATE MOFETIL 500 MG TABLET: 30 days supply | Qty: 60 | Fill #0 | Status: AC

## 2017-11-25 MED ORDER — FUROSEMIDE 20 MG TABLET
ORAL_TABLET | Freq: Every day | ORAL | 3 refills | 0.00000 days | Status: CP
Start: 2017-11-25 — End: 2018-11-25
  Filled 2017-11-25: qty 180, 90d supply, fill #0

## 2017-11-25 MED ORDER — AZITHROMYCIN 250 MG TABLET
ORAL_TABLET | Freq: Every day | ORAL | 3 refills | 90 days | Status: CP
Start: 2017-11-25 — End: 2018-10-26
  Filled 2017-11-25: qty 30, 30d supply, fill #0

## 2017-11-25 MED FILL — ATORVASTATIN 20 MG TABLET: ORAL | 30 days supply | Qty: 30 | Fill #2

## 2017-11-25 MED FILL — ATORVASTATIN 20 MG TABLET: 30 days supply | Qty: 30 | Fill #2 | Status: AC

## 2017-11-25 MED FILL — FUROSEMIDE 20 MG TABLET: 90 days supply | Qty: 180 | Fill #0 | Status: AC

## 2017-11-25 MED FILL — HYDROCHLOROTHIAZIDE 12.5 MG TABLET: 30 days supply | Qty: 30 | Fill #2 | Status: AC

## 2017-11-25 MED FILL — ASPIRIN 81 MG TABLET,DELAYED RELEASE: 150 days supply | Qty: 150 | Fill #0 | Status: AC

## 2017-11-25 MED FILL — ASPIRIN 81 MG TABLET,DELAYED RELEASE: ORAL | 150 days supply | Qty: 150 | Fill #0

## 2017-11-25 MED FILL — HYDROCHLOROTHIAZIDE 12.5 MG TABLET: ORAL | 30 days supply | Qty: 30 | Fill #2

## 2017-11-25 MED FILL — AZITHROMYCIN 250 MG TABLET: 30 days supply | Qty: 30 | Fill #0 | Status: AC

## 2017-12-06 MED FILL — BD LUER-LOK SYRINGE 3 ML 20 GAUGE X 1 1/2": 13 days supply | Qty: 27 | Fill #1 | Status: AC

## 2017-12-06 MED FILL — VENTOLIN HFA 90 MCG/ACTUATION AEROSOL INHALER: 16 days supply | Qty: 18 | Fill #1 | Status: AC

## 2017-12-06 MED FILL — VENTOLIN HFA 90 MCG/ACTUATION AEROSOL INHALER: 16 days supply | Qty: 18 | Fill #1

## 2017-12-06 MED FILL — BD LUER-LOK SYRINGE 3 ML 20 GAUGE X 1 1/2": 13 days supply | Qty: 27 | Fill #1

## 2017-12-09 MED FILL — AMIKACIN 500 MG/2 ML INJECTION SOLUTION: 30 days supply | Qty: 60 | Fill #1 | Status: AC

## 2017-12-09 MED FILL — AMIKACIN 500 MG/2 ML INJECTION SOLUTION: 30 days supply | Qty: 60 | Fill #1

## 2017-12-20 MED FILL — AZITHROMYCIN 250 MG TABLET: 30 days supply | Qty: 30 | Fill #1 | Status: AC

## 2017-12-20 MED FILL — ATORVASTATIN 20 MG TABLET: ORAL | 30 days supply | Qty: 30 | Fill #3

## 2017-12-20 MED FILL — HYDROCHLOROTHIAZIDE 12.5 MG TABLET: 30 days supply | Qty: 30 | Fill #3 | Status: AC

## 2017-12-20 MED FILL — MYCOPHENOLATE MOFETIL 500 MG TABLET: ORAL | 30 days supply | Qty: 60 | Fill #1

## 2017-12-20 MED FILL — MYCOPHENOLATE MOFETIL 500 MG TABLET: 30 days supply | Qty: 60 | Fill #1 | Status: AC

## 2017-12-20 MED FILL — ATORVASTATIN 20 MG TABLET: 30 days supply | Qty: 30 | Fill #3 | Status: AC

## 2017-12-20 MED FILL — AZITHROMYCIN 250 MG TABLET: ORAL | 30 days supply | Qty: 30 | Fill #1

## 2017-12-20 MED FILL — MYCOPHENOLATE MOFETIL 250 MG CAPSULE: ORAL | 30 days supply | Qty: 60 | Fill #1

## 2017-12-20 MED FILL — HYDROCHLOROTHIAZIDE 12.5 MG TABLET: ORAL | 30 days supply | Qty: 30 | Fill #3

## 2017-12-20 MED FILL — MYCOPHENOLATE MOFETIL 250 MG CAPSULE: 30 days supply | Qty: 60 | Fill #1 | Status: AC

## 2017-12-23 ENCOUNTER — Encounter: Admit: 2017-12-23 | Discharge: 2017-12-23 | Payer: PRIVATE HEALTH INSURANCE

## 2017-12-23 DIAGNOSIS — D508 Other iron deficiency anemias: Secondary | ICD-10-CM

## 2017-12-23 DIAGNOSIS — D801 Nonfamilial hypogammaglobulinemia: Secondary | ICD-10-CM

## 2017-12-23 DIAGNOSIS — Z942 Lung transplant status: Secondary | ICD-10-CM

## 2017-12-23 DIAGNOSIS — R7989 Other specified abnormal findings of blood chemistry: Secondary | ICD-10-CM

## 2017-12-23 DIAGNOSIS — Z4824 Encounter for aftercare following lung transplant: Principal | ICD-10-CM

## 2017-12-24 MED ORDER — VALGANCICLOVIR 450 MG TABLET
ORAL_TABLET | Freq: Every day | ORAL | 3 refills | 0.00000 days | Status: CP
Start: 2017-12-24 — End: 2018-01-06

## 2017-12-31 MED ORDER — MYCOPHENOLATE MOFETIL 500 MG TABLET
ORAL_TABLET | Freq: Two times a day (BID) | ORAL | 3 refills | 0 days | Status: CP
Start: 2017-12-31 — End: 2018-02-17
  Filled 2018-01-06: qty 60, 30d supply, fill #0

## 2018-01-06 ENCOUNTER — Encounter: Admit: 2018-01-06 | Discharge: 2018-01-06 | Payer: PRIVATE HEALTH INSURANCE

## 2018-01-06 ENCOUNTER — Encounter: Admit: 2018-01-06 | Discharge: 2018-01-06 | Payer: PRIVATE HEALTH INSURANCE | Attending: Family | Primary: Family

## 2018-01-06 DIAGNOSIS — D508 Other iron deficiency anemias: Principal | ICD-10-CM

## 2018-01-06 DIAGNOSIS — Z942 Lung transplant status: Principal | ICD-10-CM

## 2018-01-06 DIAGNOSIS — Z4824 Encounter for aftercare following lung transplant: Secondary | ICD-10-CM

## 2018-01-06 DIAGNOSIS — B349 Viral infection, unspecified: Secondary | ICD-10-CM

## 2018-01-06 DIAGNOSIS — B259 Cytomegaloviral disease, unspecified: Secondary | ICD-10-CM

## 2018-01-06 DIAGNOSIS — D801 Nonfamilial hypogammaglobulinemia: Secondary | ICD-10-CM

## 2018-01-06 MED ORDER — COMPRESSION STOCKING, KNEE HIGH,REGULAR LENGTH,MEDIUM
Freq: Every day | 5 refills | 0 days | Status: CP
Start: 2018-01-06 — End: 2018-02-03

## 2018-01-06 MED ORDER — VALGANCICLOVIR 450 MG TABLET
ORAL_TABLET | Freq: Two times a day (BID) | ORAL | 3 refills | 0.00000 days | Status: SS
Start: 2018-01-06 — End: 2018-02-21

## 2018-01-06 MED ORDER — DOXYCYCLINE HYCLATE 100 MG TABLET
ORAL_TABLET | Freq: Two times a day (BID) | ORAL | 0 refills | 0.00000 days | Status: CP
Start: 2018-01-06 — End: 2018-02-03
  Filled 2018-01-06: qty 28, 14d supply, fill #0

## 2018-01-06 MED FILL — PREDNISONE 5 MG TABLET: 90 days supply | Qty: 90 | Fill #1 | Status: AC

## 2018-01-06 MED FILL — PANTOPRAZOLE 40 MG TABLET,DELAYED RELEASE: 90 days supply | Qty: 90 | Fill #1 | Status: AC

## 2018-01-06 MED FILL — VENTOLIN HFA 90 MCG/ACTUATION AEROSOL INHALER: 25 days supply | Qty: 18 | Fill #2 | Status: AC

## 2018-01-06 MED FILL — DOXYCYCLINE HYCLATE 100 MG TABLET: 14 days supply | Qty: 28 | Fill #0 | Status: AC

## 2018-01-06 MED FILL — VENTOLIN HFA 90 MCG/ACTUATION AEROSOL INHALER: 25 days supply | Qty: 18 | Fill #2

## 2018-01-06 MED FILL — MYCOPHENOLATE MOFETIL 500 MG TABLET: 30 days supply | Qty: 60 | Fill #0 | Status: AC

## 2018-01-06 MED FILL — PREDNISONE 5 MG TABLET: 90 days supply | Qty: 90 | Fill #1

## 2018-01-06 MED FILL — CYPROHEPTADINE 4 MG TABLET: 90 days supply | Qty: 90 | Fill #1 | Status: AC

## 2018-01-06 MED FILL — PANTOPRAZOLE 40 MG TABLET,DELAYED RELEASE: 90 days supply | Qty: 90 | Fill #1

## 2018-01-06 MED FILL — CYPROHEPTADINE 4 MG TABLET: ORAL | 90 days supply | Qty: 90 | Fill #1

## 2018-01-07 ENCOUNTER — Encounter: Admit: 2018-01-07 | Discharge: 2018-01-08 | Payer: PRIVATE HEALTH INSURANCE

## 2018-01-07 DIAGNOSIS — Z942 Lung transplant status: Principal | ICD-10-CM

## 2018-01-07 MED ORDER — MEROPENEM 1 GRAM/50 ML IN 0.9% SODIUM CHLORIDE INTRAVENOUS PIGGYBACK
Freq: Three times a day (TID) | INTRAVENOUS | 0 refills | 0 days | Status: CP
Start: 2018-01-07 — End: 2018-01-21

## 2018-01-08 ENCOUNTER — Encounter: Admit: 2018-01-08 | Discharge: 2018-01-08 | Payer: PRIVATE HEALTH INSURANCE

## 2018-01-08 DIAGNOSIS — Z942 Lung transplant status: Principal | ICD-10-CM

## 2018-01-13 MED ORDER — OSELTAMIVIR 30 MG CAPSULE
ORAL_CAPSULE | Freq: Every day | ORAL | 4 refills | 0 days | Status: CP
Start: 2018-01-13 — End: 2018-05-23
  Filled 2018-01-14: qty 30, 30d supply, fill #0

## 2018-01-13 MED ORDER — CALCIUM CARBONATE 600 MG(1,500 MG)-VITAMIN D3 800 UNIT CHEWABLE TABLET
ORAL_TABLET | Freq: Two times a day (BID) | ORAL | 3 refills | 0.00000 days | Status: CP
Start: 2018-01-13 — End: 2018-04-24

## 2018-01-14 MED FILL — OSELTAMIVIR 30 MG CAPSULE: 30 days supply | Qty: 30 | Fill #0 | Status: AC

## 2018-01-23 MED FILL — HYDROCHLOROTHIAZIDE 12.5 MG TABLET: 30 days supply | Qty: 30 | Fill #4 | Status: AC

## 2018-01-23 MED FILL — ASPIRIN 81 MG TABLET,DELAYED RELEASE: ORAL | 150 days supply | Qty: 150 | Fill #1

## 2018-01-23 MED FILL — BD LUER-LOK SYRINGE 3 ML 20 GAUGE X 1 1/2": 13 days supply | Qty: 27 | Fill #2 | Status: AC

## 2018-01-23 MED FILL — HYDROCHLOROTHIAZIDE 12.5 MG TABLET: ORAL | 30 days supply | Qty: 30 | Fill #4

## 2018-01-23 MED FILL — BD LUER-LOK SYRINGE 3 ML 20 GAUGE X 1 1/2": 13 days supply | Qty: 27 | Fill #2

## 2018-01-23 MED FILL — AZITHROMYCIN 250 MG TABLET: ORAL | 30 days supply | Qty: 30 | Fill #2

## 2018-01-23 MED FILL — ASPIRIN 81 MG TABLET,DELAYED RELEASE: 150 days supply | Qty: 150 | Fill #1 | Status: AC

## 2018-01-23 MED FILL — ATORVASTATIN 20 MG TABLET: ORAL | 30 days supply | Qty: 30 | Fill #4

## 2018-01-23 MED FILL — AZITHROMYCIN 250 MG TABLET: 30 days supply | Qty: 30 | Fill #2 | Status: AC

## 2018-01-23 MED FILL — ATORVASTATIN 20 MG TABLET: 30 days supply | Qty: 30 | Fill #4 | Status: AC

## 2018-01-25 ENCOUNTER — Encounter: Admit: 2018-01-25 | Discharge: 2018-01-26 | Payer: PRIVATE HEALTH INSURANCE

## 2018-01-25 DIAGNOSIS — Z942 Lung transplant status: Principal | ICD-10-CM

## 2018-01-25 MED ORDER — AMIKACIN 500 MG/2 ML INJECTION SOLUTION
11 refills | 0 days | Status: CP
Start: 2018-01-25 — End: 2019-01-25
  Filled 2018-01-25: qty 40, 40d supply, fill #0

## 2018-01-25 MED FILL — AMIKACIN 500 MG/2 ML INJECTION SOLUTION: 40 days supply | Qty: 40 | Fill #0 | Status: AC

## 2018-01-27 MED ORDER — TACROLIMUS 5 MG CAPSULE
ORAL_CAPSULE | Freq: Two times a day (BID) | ORAL | 3 refills | 0 days | Status: CP
Start: 2018-01-27 — End: 2018-03-14
  Filled 2018-02-27: qty 180, 90d supply, fill #0

## 2018-01-27 MED ORDER — TACROLIMUS 1 MG CAPSULE
ORAL_CAPSULE | Freq: Two times a day (BID) | ORAL | 3 refills | 0.00000 days | Status: CP
Start: 2018-01-27 — End: 2018-03-14

## 2018-01-30 MED ORDER — PEG-ELECTROLYTE SOLUTION 420 GRAM ORAL SOLUTION
0 refills | 0 days | Status: CP
Start: 2018-01-30 — End: 2018-03-05

## 2018-02-03 DIAGNOSIS — B259 Cytomegaloviral disease, unspecified: Principal | ICD-10-CM

## 2018-02-03 DIAGNOSIS — Z4824 Encounter for aftercare following lung transplant: Secondary | ICD-10-CM

## 2018-02-03 DIAGNOSIS — Q348 Other specified congenital malformations of respiratory system: Secondary | ICD-10-CM

## 2018-02-03 DIAGNOSIS — Z942 Lung transplant status: Secondary | ICD-10-CM

## 2018-02-03 DIAGNOSIS — D801 Nonfamilial hypogammaglobulinemia: Principal | ICD-10-CM

## 2018-02-03 DIAGNOSIS — B349 Viral infection, unspecified: Secondary | ICD-10-CM

## 2018-02-03 MED ORDER — COMPRESSION STOCKING, KNEE HIGH,REGULAR LENGTH,MEDIUM
Freq: Every day | 5 refills | 0 days | Status: CP
Start: 2018-02-03 — End: 2018-05-04

## 2018-02-04 ENCOUNTER — Encounter: Admit: 2018-02-04 | Discharge: 2018-02-04 | Payer: PRIVATE HEALTH INSURANCE

## 2018-02-04 ENCOUNTER — Ambulatory Visit: Admit: 2018-02-04 | Discharge: 2018-02-04 | Payer: PRIVATE HEALTH INSURANCE

## 2018-02-04 ENCOUNTER — Encounter: Admit: 2018-02-04 | Discharge: 2018-02-04 | Payer: PRIVATE HEALTH INSURANCE | Attending: Family | Primary: Family

## 2018-02-11 ENCOUNTER — Encounter: Admit: 2018-02-11 | Discharge: 2018-02-11 | Payer: PRIVATE HEALTH INSURANCE

## 2018-02-11 ENCOUNTER — Ambulatory Visit: Admit: 2018-02-11 | Discharge: 2018-02-11 | Payer: PRIVATE HEALTH INSURANCE

## 2018-02-11 DIAGNOSIS — Z4824 Encounter for aftercare following lung transplant: Principal | ICD-10-CM

## 2018-02-11 DIAGNOSIS — Z942 Lung transplant status: Principal | ICD-10-CM

## 2018-02-14 MED ORDER — MONTELUKAST 10 MG TABLET
ORAL_TABLET | Freq: Every evening | ORAL | 11 refills | 30 days | Status: CP
Start: 2018-02-14 — End: 2019-02-14
  Filled 2018-02-17: qty 30, 30d supply, fill #0

## 2018-02-14 MED ORDER — BUDESONIDE-FORMOTEROL HFA 160 MCG-4.5 MCG/ACTUATION AEROSOL INHALER
Freq: Two times a day (BID) | RESPIRATORY_TRACT | 11 refills | 15.00000 days | Status: CP
Start: 2018-02-14 — End: 2019-02-14

## 2018-02-17 MED ORDER — MYCOPHENOLATE MOFETIL 500 MG TABLET
ORAL_TABLET | Freq: Two times a day (BID) | ORAL | 3 refills | 0 days | Status: CP
Start: 2018-02-17 — End: 2018-05-02
  Filled 2018-02-17: qty 60, 30d supply, fill #0

## 2018-02-17 MED FILL — AZITHROMYCIN 250 MG TABLET: ORAL | 30 days supply | Qty: 30 | Fill #3

## 2018-02-17 MED FILL — HYDROCHLOROTHIAZIDE 12.5 MG TABLET: 30 days supply | Qty: 30 | Fill #5 | Status: AC

## 2018-02-17 MED FILL — MONTELUKAST 10 MG TABLET: 30 days supply | Qty: 30 | Fill #0 | Status: AC

## 2018-02-17 MED FILL — SYMBICORT 160 MCG-4.5 MCG/ACTUATION HFA AEROSOL INHALER: 30 days supply | Qty: 10 | Fill #0 | Status: AC

## 2018-02-17 MED FILL — VENTOLIN HFA 90 MCG/ACTUATION AEROSOL INHALER: 25 days supply | Qty: 18 | Fill #3

## 2018-02-17 MED FILL — VENTOLIN HFA 90 MCG/ACTUATION AEROSOL INHALER: 25 days supply | Qty: 18 | Fill #3 | Status: AC

## 2018-02-17 MED FILL — SYMBICORT 160 MCG-4.5 MCG/ACTUATION HFA AEROSOL INHALER: RESPIRATORY_TRACT | 30 days supply | Qty: 10.2 | Fill #0

## 2018-02-17 MED FILL — AZITHROMYCIN 250 MG TABLET: 30 days supply | Qty: 30 | Fill #3 | Status: AC

## 2018-02-17 MED FILL — ATORVASTATIN 20 MG TABLET: 30 days supply | Qty: 30 | Fill #5 | Status: AC

## 2018-02-17 MED FILL — OSELTAMIVIR 30 MG CAPSULE: 10 days supply | Qty: 10 | Fill #1 | Status: AC

## 2018-02-17 MED FILL — OSELTAMIVIR 30 MG CAPSULE: ORAL | 10 days supply | Qty: 10 | Fill #1

## 2018-02-17 MED FILL — HYDROCHLOROTHIAZIDE 12.5 MG TABLET: ORAL | 30 days supply | Qty: 30 | Fill #5

## 2018-02-17 MED FILL — MYCOPHENOLATE MOFETIL 500 MG TABLET: 30 days supply | Qty: 60 | Fill #0 | Status: AC

## 2018-02-17 MED FILL — ATORVASTATIN 20 MG TABLET: ORAL | 30 days supply | Qty: 30 | Fill #5

## 2018-02-21 ENCOUNTER — Encounter: Admit: 2018-02-21 | Discharge: 2018-02-21 | Payer: PRIVATE HEALTH INSURANCE

## 2018-02-21 DIAGNOSIS — Z942 Lung transplant status: Principal | ICD-10-CM

## 2018-02-21 MED ORDER — VALGANCICLOVIR 450 MG TABLET
ORAL_TABLET | Freq: Every day | ORAL | 3 refills | 0 days
Start: 2018-02-21 — End: 2018-05-02

## 2018-02-25 MED ORDER — OSELTAMIVIR 30 MG CAPSULE
ORAL_CAPSULE | Freq: Every day | ORAL | 0 refills | 0.00000 days
Start: 2018-02-25 — End: 2018-02-25

## 2018-02-27 MED FILL — OSELTAMIVIR 30 MG CAPSULE: 30 days supply | Qty: 30 | Fill #2 | Status: AC

## 2018-02-27 MED FILL — PREDNISONE 2.5 MG TABLET: 90 days supply | Qty: 90 | Fill #1 | Status: AC

## 2018-02-27 MED FILL — TACROLIMUS 5 MG CAPSULE: 90 days supply | Qty: 180 | Fill #0 | Status: AC

## 2018-02-27 MED FILL — TACROLIMUS 1 MG CAPSULE, IMMEDIATE-RELEASE: ORAL | 90 days supply | Qty: 360 | Fill #0

## 2018-02-27 MED FILL — TACROLIMUS 1 MG CAPSULE: 90 days supply | Qty: 360 | Fill #0 | Status: AC

## 2018-02-27 MED FILL — OSELTAMIVIR 30 MG CAPSULE: ORAL | 30 days supply | Qty: 30 | Fill #2

## 2018-02-27 MED FILL — PREDNISONE 2.5 MG TABLET: 90 days supply | Qty: 90 | Fill #1

## 2018-03-05 MED ORDER — PEG-ELECTROLYTE SOLUTION 420 GRAM ORAL SOLUTION
0 refills | 0 days | Status: CP
Start: 2018-03-05 — End: 2019-03-06

## 2018-03-07 ENCOUNTER — Encounter: Admit: 2018-03-07 | Discharge: 2018-03-08 | Payer: PRIVATE HEALTH INSURANCE

## 2018-03-07 DIAGNOSIS — Z942 Lung transplant status: Principal | ICD-10-CM

## 2018-03-07 DIAGNOSIS — T86819 Unspecified complication of lung transplant: Principal | ICD-10-CM

## 2018-03-07 MED FILL — AMIKACIN 500 MG/2 ML INJECTION SOLUTION: 30 days supply | Qty: 60 | Fill #1 | Status: AC

## 2018-03-07 MED FILL — AMIKACIN 500 MG/2 ML INJECTION SOLUTION: 30 days supply | Qty: 60 | Fill #1

## 2018-03-14 ENCOUNTER — Encounter: Admit: 2018-03-14 | Discharge: 2018-03-15 | Payer: PRIVATE HEALTH INSURANCE

## 2018-03-14 DIAGNOSIS — D801 Nonfamilial hypogammaglobulinemia: Secondary | ICD-10-CM

## 2018-03-14 DIAGNOSIS — B259 Cytomegaloviral disease, unspecified: Principal | ICD-10-CM

## 2018-03-14 DIAGNOSIS — Z4824 Encounter for aftercare following lung transplant: Secondary | ICD-10-CM

## 2018-03-14 DIAGNOSIS — Z942 Lung transplant status: Principal | ICD-10-CM

## 2018-03-14 DIAGNOSIS — B349 Viral infection, unspecified: Secondary | ICD-10-CM

## 2018-03-14 MED ORDER — TACROLIMUS 1 MG CAPSULE
ORAL_CAPSULE | Freq: Two times a day (BID) | ORAL | 3 refills | 0.00000 days | Status: CP
Start: 2018-03-14 — End: 2018-07-17

## 2018-03-14 MED ORDER — TACROLIMUS 5 MG CAPSULE
ORAL_CAPSULE | Freq: Two times a day (BID) | ORAL | 3 refills | 0 days | Status: CP
Start: 2018-03-14 — End: 2018-07-17
  Filled 2018-03-27: qty 180, 90d supply, fill #0

## 2018-03-21 ENCOUNTER — Encounter
Admit: 2018-03-21 | Discharge: 2018-03-22 | Payer: PRIVATE HEALTH INSURANCE | Attending: "Endocrinology | Primary: "Endocrinology

## 2018-03-21 DIAGNOSIS — N183 Chronic kidney disease, stage 3 (moderate): Secondary | ICD-10-CM

## 2018-03-21 DIAGNOSIS — Z942 Lung transplant status: Secondary | ICD-10-CM

## 2018-03-21 DIAGNOSIS — M8580 Other specified disorders of bone density and structure, unspecified site: Principal | ICD-10-CM

## 2018-03-21 DIAGNOSIS — T380X5A Adverse effect of glucocorticoids and synthetic analogues, initial encounter: Secondary | ICD-10-CM

## 2018-03-21 MED FILL — MYCOPHENOLATE MOFETIL 500 MG TABLET: ORAL | 30 days supply | Qty: 60 | Fill #1

## 2018-03-21 MED FILL — SYMBICORT 160 MCG-4.5 MCG/ACTUATION HFA AEROSOL INHALER: RESPIRATORY_TRACT | 30 days supply | Qty: 10.2 | Fill #1

## 2018-03-21 MED FILL — ATORVASTATIN 20 MG TABLET: 30 days supply | Qty: 30 | Fill #6 | Status: AC

## 2018-03-21 MED FILL — MONTELUKAST 10 MG TABLET: 30 days supply | Qty: 30 | Fill #1 | Status: AC

## 2018-03-21 MED FILL — ATORVASTATIN 20 MG TABLET: ORAL | 30 days supply | Qty: 30 | Fill #6

## 2018-03-21 MED FILL — MONTELUKAST 10 MG TABLET: ORAL | 30 days supply | Qty: 30 | Fill #1

## 2018-03-21 MED FILL — HYDROCHLOROTHIAZIDE 12.5 MG TABLET: ORAL | 30 days supply | Qty: 30 | Fill #6

## 2018-03-21 MED FILL — SYMBICORT 160 MCG-4.5 MCG/ACTUATION HFA AEROSOL INHALER: 30 days supply | Qty: 10 | Fill #1 | Status: AC

## 2018-03-21 MED FILL — MYCOPHENOLATE MOFETIL 500 MG TABLET: 30 days supply | Qty: 60 | Fill #1 | Status: AC

## 2018-03-21 MED FILL — HYDROCHLOROTHIAZIDE 12.5 MG TABLET: 30 days supply | Qty: 30 | Fill #6 | Status: AC

## 2018-03-27 ENCOUNTER — Other Ambulatory Visit: Admit: 2018-03-27 | Discharge: 2018-03-27 | Payer: PRIVATE HEALTH INSURANCE

## 2018-03-27 ENCOUNTER — Encounter: Admit: 2018-03-27 | Discharge: 2018-03-27 | Payer: PRIVATE HEALTH INSURANCE

## 2018-03-27 ENCOUNTER — Ambulatory Visit: Admit: 2018-03-27 | Discharge: 2018-03-27 | Payer: PRIVATE HEALTH INSURANCE

## 2018-03-27 DIAGNOSIS — Z942 Lung transplant status: Principal | ICD-10-CM

## 2018-03-27 DIAGNOSIS — M8580 Other specified disorders of bone density and structure, unspecified site: Principal | ICD-10-CM

## 2018-03-27 DIAGNOSIS — T380X5A Adverse effect of glucocorticoids and synthetic analogues, initial encounter: Secondary | ICD-10-CM

## 2018-03-27 MED FILL — VENTOLIN HFA 90 MCG/ACTUATION AEROSOL INHALER: 25 days supply | Qty: 18 | Fill #4 | Status: AC

## 2018-03-27 MED FILL — VENTOLIN HFA 90 MCG/ACTUATION AEROSOL INHALER: 25 days supply | Qty: 18 | Fill #4

## 2018-03-27 MED FILL — OSELTAMIVIR 30 MG CAPSULE: 30 days supply | Qty: 30 | Fill #3 | Status: AC

## 2018-03-27 MED FILL — AZITHROMYCIN 250 MG TABLET: 30 days supply | Qty: 30 | Fill #4 | Status: AC

## 2018-03-27 MED FILL — TACROLIMUS 1 MG CAPSULE: 90 days supply | Qty: 180 | Fill #0 | Status: AC

## 2018-03-27 MED FILL — TACROLIMUS 5 MG CAPSULE: 90 days supply | Qty: 180 | Fill #0 | Status: AC

## 2018-03-27 MED FILL — AZITHROMYCIN 250 MG TABLET: ORAL | 30 days supply | Qty: 30 | Fill #4

## 2018-03-27 MED FILL — OSELTAMIVIR 30 MG CAPSULE: ORAL | 30 days supply | Qty: 30 | Fill #3

## 2018-03-27 MED FILL — TACROLIMUS 5 MG CAPSULE, IMMEDIATE-RELEASE: ORAL | 90 days supply | Qty: 180 | Fill #0

## 2018-04-08 MED ORDER — CYPROHEPTADINE 4 MG TABLET
ORAL_TABLET | ORAL | 3 refills | 0 days | Status: CP
Start: 2018-04-08 — End: 2019-04-08
  Filled 2018-04-08: qty 90, 90d supply, fill #0

## 2018-04-08 MED ORDER — PANTOPRAZOLE 40 MG TABLET,DELAYED RELEASE
ORAL_TABLET | 3 refills | 0 days | Status: CP
Start: 2018-04-08 — End: 2019-04-08
  Filled 2018-04-08: qty 90, 90d supply, fill #0

## 2018-04-08 MED FILL — CYPROHEPTADINE 4 MG TABLET: 90 days supply | Qty: 90 | Fill #0 | Status: AC

## 2018-04-08 MED FILL — PREDNISONE 5 MG TABLET: 90 days supply | Qty: 90 | Fill #2

## 2018-04-08 MED FILL — PANTOPRAZOLE 40 MG TABLET,DELAYED RELEASE: 90 days supply | Qty: 90 | Fill #0 | Status: AC

## 2018-04-08 MED FILL — BD LUER-LOK SYRINGE 3 ML 20 GAUGE X 1 1/2": 13 days supply | Qty: 27 | Fill #3 | Status: AC

## 2018-04-08 MED FILL — AMIKACIN 500 MG/2 ML INJECTION SOLUTION: 30 days supply | Qty: 60 | Fill #2 | Status: AC

## 2018-04-08 MED FILL — AMIKACIN 500 MG/2 ML INJECTION SOLUTION: 30 days supply | Qty: 60 | Fill #2

## 2018-04-08 MED FILL — PREDNISONE 5 MG TABLET: 90 days supply | Qty: 90 | Fill #2 | Status: AC

## 2018-04-08 MED FILL — BD LUER-LOK SYRINGE 3 ML 20 GAUGE X 1 1/2": 13 days supply | Qty: 27 | Fill #3

## 2018-04-09 MED ORDER — LEVOCETIRIZINE 5 MG TABLET
ORAL_TABLET | Freq: Every evening | ORAL | 11 refills | 0.00000 days | Status: CP
Start: 2018-04-09 — End: 2019-04-09

## 2018-04-11 ENCOUNTER — Encounter: Admit: 2018-04-11 | Discharge: 2018-04-12 | Payer: PRIVATE HEALTH INSURANCE

## 2018-04-11 DIAGNOSIS — Z942 Lung transplant status: Secondary | ICD-10-CM

## 2018-04-11 DIAGNOSIS — D801 Nonfamilial hypogammaglobulinemia: Secondary | ICD-10-CM

## 2018-04-11 DIAGNOSIS — B349 Viral infection, unspecified: Secondary | ICD-10-CM

## 2018-04-11 DIAGNOSIS — B259 Cytomegaloviral disease, unspecified: Principal | ICD-10-CM

## 2018-04-22 MED FILL — OSELTAMIVIR 30 MG CAPSULE: ORAL | 30 days supply | Qty: 30 | Fill #4

## 2018-04-22 MED FILL — ATORVASTATIN 20 MG TABLET: 30 days supply | Qty: 30 | Fill #7 | Status: AC

## 2018-04-22 MED FILL — AZITHROMYCIN 250 MG TABLET: ORAL | 30 days supply | Qty: 30 | Fill #5

## 2018-04-22 MED FILL — MONTELUKAST 10 MG TABLET: 30 days supply | Qty: 30 | Fill #2 | Status: AC

## 2018-04-22 MED FILL — VENTOLIN HFA 90 MCG/ACTUATION AEROSOL INHALER: 25 days supply | Qty: 18 | Fill #5 | Status: AC

## 2018-04-22 MED FILL — FUROSEMIDE 20 MG TABLET: 90 days supply | Qty: 180 | Fill #1 | Status: AC

## 2018-04-22 MED FILL — ATORVASTATIN 20 MG TABLET: ORAL | 30 days supply | Qty: 30 | Fill #7

## 2018-04-22 MED FILL — MYCOPHENOLATE MOFETIL 500 MG TABLET: ORAL | 30 days supply | Qty: 60 | Fill #2

## 2018-04-22 MED FILL — AZITHROMYCIN 250 MG TABLET: 30 days supply | Qty: 30 | Fill #5 | Status: AC

## 2018-04-22 MED FILL — MYCOPHENOLATE MOFETIL 500 MG TABLET: 30 days supply | Qty: 60 | Fill #2 | Status: AC

## 2018-04-22 MED FILL — VENTOLIN HFA 90 MCG/ACTUATION AEROSOL INHALER: 25 days supply | Qty: 18 | Fill #5

## 2018-04-22 MED FILL — SYMBICORT 160 MCG-4.5 MCG/ACTUATION HFA AEROSOL INHALER: 30 days supply | Qty: 10 | Fill #2 | Status: AC

## 2018-04-22 MED FILL — FUROSEMIDE 20 MG TABLET: ORAL | 90 days supply | Qty: 180 | Fill #1

## 2018-04-22 MED FILL — HYDROCHLOROTHIAZIDE 12.5 MG TABLET: ORAL | 30 days supply | Qty: 30 | Fill #7

## 2018-04-22 MED FILL — OSELTAMIVIR 30 MG CAPSULE: 30 days supply | Qty: 30 | Fill #4 | Status: AC

## 2018-04-22 MED FILL — SYMBICORT 160 MCG-4.5 MCG/ACTUATION HFA AEROSOL INHALER: RESPIRATORY_TRACT | 30 days supply | Qty: 10.2 | Fill #2

## 2018-04-22 MED FILL — HYDROCHLOROTHIAZIDE 12.5 MG TABLET: 30 days supply | Qty: 30 | Fill #7 | Status: AC

## 2018-04-22 MED FILL — MONTELUKAST 10 MG TABLET: ORAL | 30 days supply | Qty: 30 | Fill #2

## 2018-04-24 ENCOUNTER — Encounter: Admit: 2018-04-24 | Discharge: 2018-04-25 | Payer: PRIVATE HEALTH INSURANCE

## 2018-04-24 DIAGNOSIS — J329 Chronic sinusitis, unspecified: Principal | ICD-10-CM

## 2018-04-24 DIAGNOSIS — J479 Bronchiectasis, uncomplicated: Principal | ICD-10-CM

## 2018-04-24 DIAGNOSIS — Q348 Other specified congenital malformations of respiratory system: Principal | ICD-10-CM

## 2018-04-24 DIAGNOSIS — N183 Chronic kidney disease, stage 3 (moderate): Principal | ICD-10-CM

## 2018-04-24 DIAGNOSIS — J849 Interstitial pulmonary disease, unspecified: Principal | ICD-10-CM

## 2018-04-24 DIAGNOSIS — J310 Chronic rhinitis: Principal | ICD-10-CM

## 2018-05-02 DIAGNOSIS — Z942 Lung transplant status: Principal | ICD-10-CM

## 2018-05-02 DIAGNOSIS — B259 Cytomegaloviral disease, unspecified: Principal | ICD-10-CM

## 2018-05-02 MED ORDER — MYCOPHENOLATE MOFETIL 500 MG TABLET
ORAL_TABLET | Freq: Two times a day (BID) | ORAL | 3 refills | 0.00000 days | Status: CP
Start: 2018-05-02 — End: 2018-06-23

## 2018-05-02 MED ORDER — VALGANCICLOVIR 450 MG TABLET
ORAL_TABLET | Freq: Every day | ORAL | 3 refills | 0 days | Status: CP
Start: 2018-05-02 — End: 2018-08-21

## 2018-05-08 ENCOUNTER — Encounter: Admit: 2018-05-08 | Discharge: 2018-05-09 | Payer: PRIVATE HEALTH INSURANCE

## 2018-05-08 DIAGNOSIS — Z942 Lung transplant status: Principal | ICD-10-CM

## 2018-05-09 MED FILL — SYMBICORT 160 MCG-4.5 MCG/ACTUATION HFA AEROSOL INHALER: 30 days supply | Qty: 10 | Fill #3 | Status: AC

## 2018-05-09 MED FILL — AZITHROMYCIN 250 MG TABLET: ORAL | 30 days supply | Qty: 30 | Fill #6

## 2018-05-09 MED FILL — SYMBICORT 160 MCG-4.5 MCG/ACTUATION HFA AEROSOL INHALER: RESPIRATORY_TRACT | 30 days supply | Qty: 10.2 | Fill #3

## 2018-05-09 MED FILL — MONTELUKAST 10 MG TABLET: 30 days supply | Qty: 30 | Fill #3 | Status: AC

## 2018-05-09 MED FILL — PREDNISONE 2.5 MG TABLET: 90 days supply | Qty: 90 | Fill #2 | Status: AC

## 2018-05-09 MED FILL — AZITHROMYCIN 250 MG TABLET: 30 days supply | Qty: 30 | Fill #6 | Status: AC

## 2018-05-09 MED FILL — HYDROCHLOROTHIAZIDE 12.5 MG TABLET: ORAL | 30 days supply | Qty: 30 | Fill #8

## 2018-05-09 MED FILL — HYDROCHLOROTHIAZIDE 12.5 MG TABLET: 30 days supply | Qty: 30 | Fill #8 | Status: AC

## 2018-05-09 MED FILL — MONTELUKAST 10 MG TABLET: ORAL | 30 days supply | Qty: 30 | Fill #3

## 2018-05-09 MED FILL — PREDNISONE 2.5 MG TABLET: 90 days supply | Qty: 90 | Fill #2

## 2018-05-23 MED ORDER — SYRINGE WITH NEEDLE 3 ML 20 GAUGE X 1 1/2"
0 refills | 0 days | Status: CP
Start: 2018-05-23 — End: 2018-09-25
  Filled 2018-05-23: qty 60, 30d supply, fill #0

## 2018-05-23 MED FILL — VENTOLIN HFA 90 MCG/ACTUATION AEROSOL INHALER: 25 days supply | Qty: 18 | Fill #6

## 2018-05-23 MED FILL — ATORVASTATIN 20 MG TABLET: 30 days supply | Qty: 30 | Fill #8 | Status: AC

## 2018-05-23 MED FILL — VENTOLIN HFA 90 MCG/ACTUATION AEROSOL INHALER: 25 days supply | Qty: 18 | Fill #6 | Status: AC

## 2018-05-23 MED FILL — SYMBICORT 160 MCG-4.5 MCG/ACTUATION HFA AEROSOL INHALER: 30 days supply | Qty: 10 | Fill #4 | Status: AC

## 2018-05-23 MED FILL — OSELTAMIVIR 30 MG CAPSULE: ORAL | 20 days supply | Qty: 20 | Fill #5

## 2018-05-23 MED FILL — BD LUER-LOK SYRINGE 3 ML 20 GAUGE X 1 1/2": 30 days supply | Qty: 60 | Fill #0 | Status: AC

## 2018-05-23 MED FILL — ATORVASTATIN 20 MG TABLET: ORAL | 30 days supply | Qty: 30 | Fill #8

## 2018-05-23 MED FILL — SYMBICORT 160 MCG-4.5 MCG/ACTUATION HFA AEROSOL INHALER: RESPIRATORY_TRACT | 30 days supply | Qty: 10.2 | Fill #4

## 2018-05-23 MED FILL — OSELTAMIVIR 30 MG CAPSULE: 20 days supply | Qty: 20 | Fill #5 | Status: AC

## 2018-05-23 MED FILL — AMIKACIN 500 MG/2 ML INJECTION SOLUTION: 30 days supply | Qty: 60 | Fill #3 | Status: AC

## 2018-05-23 MED FILL — AMIKACIN 500 MG/2 ML INJECTION SOLUTION: 30 days supply | Qty: 60 | Fill #3

## 2018-06-23 MED ORDER — MYCOPHENOLATE MOFETIL 500 MG TABLET
ORAL_TABLET | Freq: Two times a day (BID) | ORAL | 3 refills | 90.00000 days | Status: CP
Start: 2018-06-23 — End: 2018-10-26
  Filled 2018-06-26: qty 60, 30d supply, fill #0

## 2018-06-23 MED ORDER — PREDNISONE 5 MG TABLET
ORAL_TABLET | 3 refills | 0 days | Status: CP
Start: 2018-06-23 — End: 2019-06-23
  Filled 2018-06-26: qty 90, 90d supply, fill #0

## 2018-06-23 MED ORDER — HYDROCHLOROTHIAZIDE 12.5 MG TABLET
ORAL_TABLET | Freq: Every day | ORAL | 3 refills | 90.00000 days | Status: CP
Start: 2018-06-23 — End: 2019-06-23
  Filled 2018-06-26: qty 90, 90d supply, fill #0

## 2018-06-26 ENCOUNTER — Encounter: Admit: 2018-06-26 | Discharge: 2018-06-26 | Payer: PRIVATE HEALTH INSURANCE

## 2018-06-26 DIAGNOSIS — Z942 Lung transplant status: Principal | ICD-10-CM

## 2018-06-26 DIAGNOSIS — M8580 Other specified disorders of bone density and structure, unspecified site: Secondary | ICD-10-CM

## 2018-06-26 DIAGNOSIS — T380X5A Adverse effect of glucocorticoids and synthetic analogues, initial encounter: Secondary | ICD-10-CM

## 2018-06-26 MED ORDER — SYRINGE WITH NEEDLE 3 ML 20 GAUGE X 1 1/2"
0 refills | 0 days
Start: 2018-06-26 — End: ?

## 2018-06-26 MED FILL — AMIKACIN 500 MG/2 ML INJECTION SOLUTION: 30 days supply | Qty: 60 | Fill #4 | Status: AC

## 2018-06-26 MED FILL — PANTOPRAZOLE 40 MG TABLET,DELAYED RELEASE: 90 days supply | Qty: 90 | Fill #1 | Status: AC

## 2018-06-26 MED FILL — ATORVASTATIN 20 MG TABLET: 30 days supply | Qty: 30 | Fill #9 | Status: AC

## 2018-06-26 MED FILL — BD LUER-LOK SYRINGE 3 ML 20 GAUGE X 1 1/2": 30 days supply | Qty: 60 | Fill #0 | Status: AC

## 2018-06-26 MED FILL — BD LUER-LOK SYRINGE 3 ML 20 GAUGE X 1 1/2": 30 days supply | Qty: 60 | Fill #0

## 2018-06-26 MED FILL — AZITHROMYCIN 250 MG TABLET: 30 days supply | Qty: 30 | Fill #7 | Status: AC

## 2018-06-26 MED FILL — MONTELUKAST 10 MG TABLET: ORAL | 30 days supply | Qty: 30 | Fill #4

## 2018-06-26 MED FILL — VENTOLIN HFA 90 MCG/ACTUATION AEROSOL INHALER: 25 days supply | Qty: 18 | Fill #7 | Status: AC

## 2018-06-26 MED FILL — PANTOPRAZOLE 40 MG TABLET,DELAYED RELEASE: 90 days supply | Qty: 90 | Fill #1

## 2018-06-26 MED FILL — PREDNISONE 5 MG TABLET: 90 days supply | Qty: 90 | Fill #0 | Status: AC

## 2018-06-26 MED FILL — AZITHROMYCIN 250 MG TABLET: ORAL | 30 days supply | Qty: 30 | Fill #7

## 2018-06-26 MED FILL — VENTOLIN HFA 90 MCG/ACTUATION AEROSOL INHALER: 25 days supply | Qty: 18 | Fill #7

## 2018-06-26 MED FILL — AMIKACIN 500 MG/2 ML INJECTION SOLUTION: 30 days supply | Qty: 60 | Fill #4

## 2018-06-26 MED FILL — MONTELUKAST 10 MG TABLET: 30 days supply | Qty: 30 | Fill #4 | Status: AC

## 2018-06-26 MED FILL — ATORVASTATIN 20 MG TABLET: ORAL | 30 days supply | Qty: 30 | Fill #9

## 2018-06-26 MED FILL — MYCOPHENOLATE MOFETIL 500 MG TABLET: 30 days supply | Qty: 60 | Fill #0 | Status: AC

## 2018-06-26 MED FILL — HYDROCHLOROTHIAZIDE 12.5 MG TABLET: 90 days supply | Qty: 90 | Fill #0 | Status: AC

## 2018-07-17 ENCOUNTER — Encounter: Admit: 2018-07-17 | Discharge: 2018-07-18 | Payer: PRIVATE HEALTH INSURANCE

## 2018-07-17 DIAGNOSIS — Z942 Lung transplant status: Principal | ICD-10-CM

## 2018-07-17 MED ORDER — TACROLIMUS 1 MG CAPSULE
ORAL_CAPSULE | ORAL | 3 refills | 0 days | Status: CP
Start: 2018-07-17 — End: 2018-08-27

## 2018-07-17 MED ORDER — TACROLIMUS 5 MG CAPSULE
ORAL_CAPSULE | Freq: Two times a day (BID) | ORAL | 3 refills | 0 days | Status: CP
Start: 2018-07-17 — End: 2018-08-27

## 2018-07-28 MED ORDER — ATORVASTATIN 20 MG TABLET
ORAL_TABLET | Freq: Every day | ORAL | 3 refills | 0 days | Status: CP
Start: 2018-07-28 — End: 2019-07-28
  Filled 2018-07-28: qty 90, 90d supply, fill #0

## 2018-07-28 MED FILL — ATORVASTATIN 20 MG TABLET: 90 days supply | Qty: 90 | Fill #0 | Status: AC

## 2018-07-28 MED FILL — MYCOPHENOLATE MOFETIL 500 MG TABLET: ORAL | 30 days supply | Qty: 60 | Fill #1

## 2018-07-28 MED FILL — FUROSEMIDE 20 MG TABLET: 90 days supply | Qty: 180 | Fill #2 | Status: AC

## 2018-07-28 MED FILL — FUROSEMIDE 20 MG TABLET: ORAL | 90 days supply | Qty: 180 | Fill #2

## 2018-07-28 MED FILL — VENTOLIN HFA 90 MCG/ACTUATION AEROSOL INHALER: 25 days supply | Qty: 18 | Fill #8 | Status: AC

## 2018-07-28 MED FILL — MONTELUKAST 10 MG TABLET: ORAL | 30 days supply | Qty: 30 | Fill #5

## 2018-07-28 MED FILL — MYCOPHENOLATE MOFETIL 500 MG TABLET: 30 days supply | Qty: 60 | Fill #1 | Status: AC

## 2018-07-28 MED FILL — AMIKACIN 500 MG/2 ML INJECTION SOLUTION: 30 days supply | Qty: 60 | Fill #5 | Status: AC

## 2018-07-28 MED FILL — MONTELUKAST 10 MG TABLET: 30 days supply | Qty: 30 | Fill #5 | Status: AC

## 2018-07-28 MED FILL — VENTOLIN HFA 90 MCG/ACTUATION AEROSOL INHALER: 25 days supply | Qty: 18 | Fill #8

## 2018-07-28 MED FILL — AZITHROMYCIN 250 MG TABLET: ORAL | 30 days supply | Qty: 30 | Fill #8

## 2018-07-28 MED FILL — SYMBICORT 160 MCG-4.5 MCG/ACTUATION HFA AEROSOL INHALER: 30 days supply | Qty: 10 | Fill #5 | Status: AC

## 2018-07-28 MED FILL — AZITHROMYCIN 250 MG TABLET: 30 days supply | Qty: 30 | Fill #8 | Status: AC

## 2018-07-28 MED FILL — AMIKACIN 500 MG/2 ML INJECTION SOLUTION: 30 days supply | Qty: 60 | Fill #5

## 2018-07-28 MED FILL — SYMBICORT 160 MCG-4.5 MCG/ACTUATION HFA AEROSOL INHALER: RESPIRATORY_TRACT | 30 days supply | Qty: 10.2 | Fill #5

## 2018-08-21 ENCOUNTER — Encounter: Admit: 2018-08-21 | Discharge: 2018-08-22 | Payer: PRIVATE HEALTH INSURANCE

## 2018-08-21 DIAGNOSIS — D801 Nonfamilial hypogammaglobulinemia: Secondary | ICD-10-CM

## 2018-08-21 DIAGNOSIS — J329 Chronic sinusitis, unspecified: Principal | ICD-10-CM

## 2018-08-21 DIAGNOSIS — Q348 Other specified congenital malformations of respiratory system: Secondary | ICD-10-CM

## 2018-08-26 ENCOUNTER — Ambulatory Visit: Admit: 2018-08-26 | Discharge: 2018-08-27 | Payer: PRIVATE HEALTH INSURANCE

## 2018-08-26 ENCOUNTER — Non-Acute Institutional Stay: Admit: 2018-08-26 | Discharge: 2018-08-27 | Payer: PRIVATE HEALTH INSURANCE

## 2018-08-26 DIAGNOSIS — Z942 Lung transplant status: Principal | ICD-10-CM

## 2018-08-27 MED ORDER — TACROLIMUS 1 MG CAPSULE: 2 mg | capsule | Freq: Two times a day (BID) | 3 refills | 90 days | Status: AC

## 2018-08-27 MED ORDER — TACROLIMUS 1 MG CAPSULE
ORAL_CAPSULE | Freq: Two times a day (BID) | ORAL | 3 refills | 90.00000 days | Status: CP
Start: 2018-08-27 — End: 2018-08-27
  Filled 2018-08-27: qty 180, 90d supply, fill #0

## 2018-08-27 MED ORDER — TACROLIMUS 5 MG CAPSULE
ORAL_CAPSULE | Freq: Two times a day (BID) | ORAL | 3 refills | 90.00000 days | Status: CP
Start: 2018-08-27 — End: 2019-08-27

## 2018-08-27 MED FILL — MYCOPHENOLATE MOFETIL 500 MG TABLET: 30 days supply | Qty: 60 | Fill #2 | Status: AC

## 2018-08-27 MED FILL — VENTOLIN HFA 90 MCG/ACTUATION AEROSOL INHALER: 25 days supply | Qty: 18 | Fill #9 | Status: AC

## 2018-08-27 MED FILL — SYMBICORT 160 MCG-4.5 MCG/ACTUATION HFA AEROSOL INHALER: RESPIRATORY_TRACT | 30 days supply | Qty: 10.2 | Fill #6

## 2018-08-27 MED FILL — MONTELUKAST 10 MG TABLET: 30 days supply | Qty: 30 | Fill #6 | Status: AC

## 2018-08-27 MED FILL — AZITHROMYCIN 250 MG TABLET: 30 days supply | Qty: 30 | Fill #9 | Status: AC

## 2018-08-27 MED FILL — VENTOLIN HFA 90 MCG/ACTUATION AEROSOL INHALER: 25 days supply | Qty: 18 | Fill #9

## 2018-08-27 MED FILL — PREDNISONE 5 MG TABLET: 90 days supply | Qty: 90 | Fill #1 | Status: AC

## 2018-08-27 MED FILL — AZITHROMYCIN 250 MG TABLET: ORAL | 30 days supply | Qty: 30 | Fill #9

## 2018-08-27 MED FILL — AMIKACIN 500 MG/2 ML INJECTION SOLUTION: 30 days supply | Qty: 60 | Fill #6 | Status: AC

## 2018-08-27 MED FILL — TACROLIMUS 1 MG CAPSULE: 90 days supply | Qty: 360 | Fill #0 | Status: AC

## 2018-08-27 MED FILL — TACROLIMUS 5 MG CAPSULE: 90 days supply | Qty: 180 | Fill #0 | Status: AC

## 2018-08-27 MED FILL — PREDNISONE 5 MG TABLET: 90 days supply | Qty: 90 | Fill #1

## 2018-08-27 MED FILL — SYMBICORT 160 MCG-4.5 MCG/ACTUATION HFA AEROSOL INHALER: 30 days supply | Qty: 10 | Fill #6 | Status: AC

## 2018-08-27 MED FILL — MONTELUKAST 10 MG TABLET: ORAL | 30 days supply | Qty: 30 | Fill #6

## 2018-08-27 MED FILL — AMIKACIN 500 MG/2 ML INJECTION SOLUTION: 30 days supply | Qty: 60 | Fill #6

## 2018-08-27 MED FILL — MYCOPHENOLATE MOFETIL 500 MG TABLET: ORAL | 30 days supply | Qty: 60 | Fill #2

## 2018-08-27 MED FILL — TACROLIMUS 1 MG CAPSULE, IMMEDIATE-RELEASE: ORAL | 90 days supply | Qty: 360 | Fill #0

## 2018-08-29 ENCOUNTER — Encounter: Admit: 2018-08-29 | Discharge: 2018-08-30 | Payer: PRIVATE HEALTH INSURANCE

## 2018-08-29 DIAGNOSIS — Z942 Lung transplant status: Principal | ICD-10-CM

## 2018-09-03 ENCOUNTER — Encounter
Admit: 2018-09-03 | Discharge: 2018-09-04 | Payer: PRIVATE HEALTH INSURANCE | Attending: Internal Medicine | Primary: Internal Medicine

## 2018-09-03 DIAGNOSIS — Z942 Lung transplant status: Principal | ICD-10-CM

## 2018-09-03 DIAGNOSIS — T86819 Unspecified complication of lung transplant: Secondary | ICD-10-CM

## 2018-09-12 ENCOUNTER — Encounter: Admit: 2018-09-12 | Discharge: 2018-09-13 | Payer: PRIVATE HEALTH INSURANCE

## 2018-09-12 DIAGNOSIS — R7989 Other specified abnormal findings of blood chemistry: Secondary | ICD-10-CM

## 2018-09-12 DIAGNOSIS — Z942 Lung transplant status: Principal | ICD-10-CM

## 2018-09-25 MED ORDER — ALBUTEROL SULFATE HFA 90 MCG/ACTUATION AEROSOL INHALER
11 refills | 0 days | Status: CP
Start: 2018-09-25 — End: 2019-09-25
  Filled 2018-09-25: qty 18, 25d supply, fill #0

## 2018-09-25 MED ORDER — SYRINGE WITH NEEDLE 3 ML 20 GAUGE X 1 1/2"
11 refills | 0.00000 days | Status: CP
Start: 2018-09-25 — End: ?
  Filled 2018-09-25: qty 60, 30d supply, fill #0

## 2018-09-25 MED ORDER — BD LUER-LOK SYRINGE 3 ML 20 GAUGE X 1"
9 refills | 0 days
Start: 2018-09-25 — End: ?

## 2018-09-25 MED FILL — MONTELUKAST 10 MG TABLET: ORAL | 30 days supply | Qty: 30 | Fill #7

## 2018-09-25 MED FILL — AMIKACIN 500 MG/2 ML INJECTION SOLUTION: 30 days supply | Qty: 60 | Fill #7

## 2018-09-25 MED FILL — AZITHROMYCIN 250 MG TABLET: 30 days supply | Qty: 30 | Fill #10 | Status: AC

## 2018-09-25 MED FILL — SYMBICORT 160 MCG-4.5 MCG/ACTUATION HFA AEROSOL INHALER: RESPIRATORY_TRACT | 30 days supply | Qty: 10.2 | Fill #7

## 2018-09-25 MED FILL — MYCOPHENOLATE MOFETIL 500 MG TABLET: ORAL | 30 days supply | Qty: 60 | Fill #3

## 2018-09-25 MED FILL — HYDROCHLOROTHIAZIDE 12.5 MG TABLET: 90 days supply | Qty: 90 | Fill #1 | Status: AC

## 2018-09-25 MED FILL — BD LUER-LOK SYRINGE 3 ML 20 GAUGE X 1 1/2": 30 days supply | Qty: 60 | Fill #0 | Status: AC

## 2018-09-25 MED FILL — VENTOLIN HFA 90 MCG/ACTUATION AEROSOL INHALER: 25 days supply | Qty: 18 | Fill #0 | Status: AC

## 2018-09-25 MED FILL — MYCOPHENOLATE MOFETIL 500 MG TABLET: 30 days supply | Qty: 60 | Fill #3 | Status: AC

## 2018-09-25 MED FILL — SYMBICORT 160 MCG-4.5 MCG/ACTUATION HFA AEROSOL INHALER: 30 days supply | Qty: 10 | Fill #7 | Status: AC

## 2018-09-25 MED FILL — HYDROCHLOROTHIAZIDE 12.5 MG TABLET: ORAL | 90 days supply | Qty: 90 | Fill #1

## 2018-09-25 MED FILL — AZITHROMYCIN 250 MG TABLET: ORAL | 30 days supply | Qty: 30 | Fill #10

## 2018-09-25 MED FILL — MONTELUKAST 10 MG TABLET: 30 days supply | Qty: 30 | Fill #7 | Status: AC

## 2018-09-25 MED FILL — AMIKACIN 500 MG/2 ML INJECTION SOLUTION: 30 days supply | Qty: 60 | Fill #7 | Status: AC

## 2018-10-04 MED ORDER — PREDNISONE 2.5 MG TABLET
ORAL_TABLET | Freq: Every day | ORAL | 3 refills | 90.00000 days | Status: CP
Start: 2018-10-04 — End: ?
  Filled 2018-10-08: qty 90, 90d supply, fill #0

## 2018-10-07 MED FILL — PREDNISONE 2.5 MG TABLET: 90 days supply | Qty: 90 | Fill #0 | Status: AC

## 2018-10-07 MED FILL — PANTOPRAZOLE 40 MG TABLET,DELAYED RELEASE: 90 days supply | Qty: 90 | Fill #2 | Status: AC

## 2018-10-08 MED FILL — PANTOPRAZOLE 40 MG TABLET,DELAYED RELEASE: 90 days supply | Qty: 90 | Fill #2

## 2018-10-27 MED FILL — ATORVASTATIN 20 MG TABLET: ORAL | 90 days supply | Qty: 90 | Fill #1

## 2018-10-27 MED FILL — SYMBICORT 160 MCG-4.5 MCG/ACTUATION HFA AEROSOL INHALER: 30 days supply | Qty: 10 | Fill #8 | Status: AC

## 2018-10-27 MED FILL — SYMBICORT 160 MCG-4.5 MCG/ACTUATION HFA AEROSOL INHALER: RESPIRATORY_TRACT | 30 days supply | Qty: 10.2 | Fill #8

## 2018-10-27 MED FILL — FUROSEMIDE 20 MG TABLET: 90 days supply | Qty: 180 | Fill #3 | Status: AC

## 2018-10-27 MED FILL — VENTOLIN HFA 90 MCG/ACTUATION AEROSOL INHALER: 25 days supply | Qty: 18 | Fill #1

## 2018-10-27 MED FILL — FUROSEMIDE 20 MG TABLET: ORAL | 90 days supply | Qty: 180 | Fill #3

## 2018-10-27 MED FILL — MONTELUKAST 10 MG TABLET: 30 days supply | Qty: 30 | Fill #8 | Status: AC

## 2018-10-27 MED FILL — AMIKACIN 500 MG/2 ML INJECTION SOLUTION: 30 days supply | Qty: 60 | Fill #8

## 2018-10-27 MED FILL — VENTOLIN HFA 90 MCG/ACTUATION AEROSOL INHALER: 25 days supply | Qty: 18 | Fill #1 | Status: AC

## 2018-10-27 MED FILL — AZITHROMYCIN 250 MG TABLET: 30 days supply | Qty: 30 | Fill #11 | Status: AC

## 2018-10-27 MED FILL — ATORVASTATIN 20 MG TABLET: 90 days supply | Qty: 90 | Fill #1 | Status: AC

## 2018-10-27 MED FILL — AZITHROMYCIN 250 MG TABLET: ORAL | 30 days supply | Qty: 30 | Fill #11

## 2018-10-27 MED FILL — MONTELUKAST 10 MG TABLET: ORAL | 30 days supply | Qty: 30 | Fill #8

## 2018-10-27 MED FILL — AMIKACIN 500 MG/2 ML INJECTION SOLUTION: 30 days supply | Qty: 60 | Fill #8 | Status: AC

## 2018-10-28 MED FILL — MYCOPHENOLATE MOFETIL 500 MG TABLET: 30 days supply | Qty: 60 | Fill #4 | Status: AC

## 2018-10-28 MED FILL — MYCOPHENOLATE MOFETIL 500 MG TABLET: ORAL | 30 days supply | Qty: 60 | Fill #4

## 2018-11-06 DIAGNOSIS — Z942 Lung transplant status: Secondary | ICD-10-CM

## 2018-11-07 DIAGNOSIS — Z942 Lung transplant status: Secondary | ICD-10-CM

## 2018-11-08 ENCOUNTER — Encounter
Admit: 2018-11-08 | Discharge: 2018-11-09 | Payer: PRIVATE HEALTH INSURANCE | Attending: Family Medicine | Primary: Family Medicine

## 2018-11-08 DIAGNOSIS — J029 Acute pharyngitis, unspecified: Secondary | ICD-10-CM

## 2018-11-10 DIAGNOSIS — Z7952 Long term (current) use of systemic steroids: Secondary | ICD-10-CM

## 2018-11-10 DIAGNOSIS — Z942 Lung transplant status: Secondary | ICD-10-CM

## 2018-11-11 ENCOUNTER — Encounter: Admit: 2018-11-11 | Discharge: 2018-11-12 | Payer: PRIVATE HEALTH INSURANCE

## 2018-11-11 ENCOUNTER — Encounter: Admit: 2018-11-11 | Discharge: 2018-11-12 | Payer: PRIVATE HEALTH INSURANCE | Attending: Family | Primary: Family

## 2018-11-11 ENCOUNTER — Ambulatory Visit: Admit: 2018-11-11 | Discharge: 2018-11-12 | Payer: PRIVATE HEALTH INSURANCE

## 2018-11-11 DIAGNOSIS — H9190 Unspecified hearing loss, unspecified ear: Secondary | ICD-10-CM

## 2018-11-11 DIAGNOSIS — J029 Acute pharyngitis, unspecified: Secondary | ICD-10-CM

## 2018-11-11 DIAGNOSIS — K219 Gastro-esophageal reflux disease without esophagitis: Secondary | ICD-10-CM

## 2018-11-11 DIAGNOSIS — R05 Cough: Secondary | ICD-10-CM

## 2018-11-11 DIAGNOSIS — Z4824 Encounter for aftercare following lung transplant: Secondary | ICD-10-CM

## 2018-11-11 DIAGNOSIS — Z942 Lung transplant status: Secondary | ICD-10-CM

## 2018-11-11 DIAGNOSIS — Z7952 Long term (current) use of systemic steroids: Secondary | ICD-10-CM

## 2018-11-11 DIAGNOSIS — Z79899 Other long term (current) drug therapy: Secondary | ICD-10-CM

## 2018-11-18 DIAGNOSIS — Z942 Lung transplant status: Secondary | ICD-10-CM

## 2018-11-21 ENCOUNTER — Encounter
Admit: 2018-11-21 | Discharge: 2018-11-22 | Payer: PRIVATE HEALTH INSURANCE | Attending: Internal Medicine | Primary: Internal Medicine

## 2018-11-21 ENCOUNTER — Encounter: Admit: 2018-11-21 | Discharge: 2018-11-22 | Payer: PRIVATE HEALTH INSURANCE

## 2018-11-21 DIAGNOSIS — J329 Chronic sinusitis, unspecified: Secondary | ICD-10-CM

## 2018-11-21 DIAGNOSIS — Z7952 Long term (current) use of systemic steroids: Secondary | ICD-10-CM

## 2018-11-21 DIAGNOSIS — Z880 Allergy status to penicillin: Secondary | ICD-10-CM

## 2018-11-21 DIAGNOSIS — R0683 Snoring: Secondary | ICD-10-CM

## 2018-11-21 DIAGNOSIS — N182 Chronic kidney disease, stage 2 (mild): Secondary | ICD-10-CM

## 2018-11-21 DIAGNOSIS — Z79899 Other long term (current) drug therapy: Secondary | ICD-10-CM

## 2018-11-21 DIAGNOSIS — D801 Nonfamilial hypogammaglobulinemia: Secondary | ICD-10-CM

## 2018-11-21 DIAGNOSIS — K219 Gastro-esophageal reflux disease without esophagitis: Secondary | ICD-10-CM

## 2018-11-21 DIAGNOSIS — Z942 Lung transplant status: Secondary | ICD-10-CM

## 2018-11-21 DIAGNOSIS — B965 Pseudomonas (aeruginosa) (mallei) (pseudomallei) as the cause of diseases classified elsewhere: Secondary | ICD-10-CM

## 2018-11-21 DIAGNOSIS — Z91040 Latex allergy status: Secondary | ICD-10-CM

## 2018-11-21 DIAGNOSIS — R5383 Other fatigue: Secondary | ICD-10-CM

## 2018-11-21 DIAGNOSIS — R2242 Localized swelling, mass and lump, left lower limb: Secondary | ICD-10-CM

## 2018-11-21 DIAGNOSIS — T86819 Unspecified complication of lung transplant: Secondary | ICD-10-CM

## 2018-11-25 DIAGNOSIS — Z942 Lung transplant status: Principal | ICD-10-CM

## 2018-11-25 MED ORDER — AZITHROMYCIN 250 MG TABLET: 250 mg | tablet | Freq: Every day | 3 refills | 90 days | Status: AC

## 2018-11-26 MED FILL — MYCOPHENOLATE MOFETIL 500 MG TABLET: ORAL | 30 days supply | Qty: 60 | Fill #5

## 2018-11-26 MED FILL — TACROLIMUS 5 MG CAPSULE: 30 days supply | Qty: 60 | Fill #1 | Status: AC

## 2018-11-26 MED FILL — VENTOLIN HFA 90 MCG/ACTUATION AEROSOL INHALER: 25 days supply | Qty: 18 | Fill #2

## 2018-11-26 MED FILL — VENTOLIN HFA 90 MCG/ACTUATION AEROSOL INHALER: 25 days supply | Qty: 18 | Fill #2 | Status: AC

## 2018-11-26 MED FILL — TACROLIMUS 5 MG CAPSULE, IMMEDIATE-RELEASE: ORAL | 30 days supply | Qty: 60 | Fill #1

## 2018-11-26 MED FILL — MONTELUKAST 10 MG TABLET: ORAL | 30 days supply | Qty: 30 | Fill #9

## 2018-11-26 MED FILL — AZITHROMYCIN 250 MG TABLET: ORAL | 90 days supply | Qty: 90 | Fill #0

## 2018-11-26 MED FILL — AZITHROMYCIN 250 MG TABLET: 90 days supply | Qty: 90 | Fill #0 | Status: AC

## 2018-11-26 MED FILL — BD LUER-LOK SYRINGE 3 ML 20 GAUGE X 1 1/2": 30 days supply | Qty: 60 | Fill #1

## 2018-11-26 MED FILL — BD LUER-LOK SYRINGE 3 ML 20 GAUGE X 1 1/2": 30 days supply | Qty: 60 | Fill #1 | Status: AC

## 2018-11-26 MED FILL — SYMBICORT 160 MCG-4.5 MCG/ACTUATION HFA AEROSOL INHALER: RESPIRATORY_TRACT | 30 days supply | Qty: 10.2 | Fill #9

## 2018-11-26 MED FILL — TACROLIMUS 1 MG CAPSULE: 90 days supply | Qty: 360 | Fill #1 | Status: AC

## 2018-11-26 MED FILL — MYCOPHENOLATE MOFETIL 500 MG TABLET: 30 days supply | Qty: 60 | Fill #5 | Status: AC

## 2018-11-26 MED FILL — SYMBICORT 160 MCG-4.5 MCG/ACTUATION HFA AEROSOL INHALER: 30 days supply | Qty: 10 | Fill #9 | Status: AC

## 2018-11-26 MED FILL — MONTELUKAST 10 MG TABLET: 30 days supply | Qty: 30 | Fill #9 | Status: AC

## 2018-11-26 MED FILL — TACROLIMUS 1 MG CAPSULE, IMMEDIATE-RELEASE: ORAL | 90 days supply | Qty: 360 | Fill #1

## 2018-12-23 MED FILL — HYDROCHLOROTHIAZIDE 12.5 MG TABLET: 90 days supply | Qty: 90 | Fill #2 | Status: AC

## 2018-12-23 MED FILL — SYMBICORT 160 MCG-4.5 MCG/ACTUATION HFA AEROSOL INHALER: 30 days supply | Qty: 10 | Fill #10 | Status: AC

## 2018-12-23 MED FILL — AMIKACIN 500 MG/2 ML INJECTION SOLUTION: 30 days supply | Qty: 60 | Fill #9 | Status: AC

## 2018-12-23 MED FILL — PANTOPRAZOLE 40 MG TABLET,DELAYED RELEASE: 90 days supply | Qty: 90 | Fill #3

## 2018-12-23 MED FILL — PANTOPRAZOLE 40 MG TABLET,DELAYED RELEASE: 90 days supply | Qty: 90 | Fill #3 | Status: AC

## 2018-12-23 MED FILL — SYMBICORT 160 MCG-4.5 MCG/ACTUATION HFA AEROSOL INHALER: RESPIRATORY_TRACT | 30 days supply | Qty: 10.2 | Fill #10

## 2018-12-23 MED FILL — TACROLIMUS 5 MG CAPSULE, IMMEDIATE-RELEASE: ORAL | 30 days supply | Qty: 60 | Fill #2

## 2018-12-23 MED FILL — VENTOLIN HFA 90 MCG/ACTUATION AEROSOL INHALER: 25 days supply | Qty: 18 | Fill #3 | Status: AC

## 2018-12-23 MED FILL — PREDNISONE 5 MG TABLET: 90 days supply | Qty: 90 | Fill #2

## 2018-12-23 MED FILL — TACROLIMUS 5 MG CAPSULE: 30 days supply | Qty: 60 | Fill #2 | Status: AC

## 2018-12-23 MED FILL — MYCOPHENOLATE MOFETIL 500 MG TABLET: ORAL | 30 days supply | Qty: 60 | Fill #6

## 2018-12-23 MED FILL — AMIKACIN 500 MG/2 ML INJECTION SOLUTION: 30 days supply | Qty: 60 | Fill #9

## 2018-12-23 MED FILL — VENTOLIN HFA 90 MCG/ACTUATION AEROSOL INHALER: 25 days supply | Qty: 18 | Fill #3

## 2018-12-23 MED FILL — PREDNISONE 5 MG TABLET: 90 days supply | Qty: 90 | Fill #2 | Status: AC

## 2018-12-23 MED FILL — HYDROCHLOROTHIAZIDE 12.5 MG TABLET: ORAL | 90 days supply | Qty: 90 | Fill #2

## 2018-12-23 MED FILL — MYCOPHENOLATE MOFETIL 500 MG TABLET: 30 days supply | Qty: 60 | Fill #6 | Status: AC

## 2018-12-24 MED ORDER — BD LUER-LOK SYRINGE 3 ML 21 GAUGE X 1"
8 refills | 0 days
Start: 2018-12-24 — End: ?

## 2018-12-24 MED ORDER — SYRINGE WITH NEEDLE 3 ML 20 GAUGE X 1 1/2"
0 refills | 0 days
Start: 2018-12-24 — End: ?

## 2019-01-02 DIAGNOSIS — B259 Cytomegaloviral disease, unspecified: Principal | ICD-10-CM

## 2019-01-02 MED ORDER — VALGANCICLOVIR 450 MG TABLET
ORAL_TABLET | Freq: Two times a day (BID) | ORAL | 3 refills | 90.00000 days | Status: CP
Start: 2019-01-02 — End: 2019-04-02
  Filled 2019-01-05: qty 360, 90d supply, fill #0

## 2019-01-05 MED FILL — PREDNISONE 2.5 MG TABLET: ORAL | 90 days supply | Qty: 90 | Fill #1

## 2019-01-05 MED FILL — BD LUER-LOK SYRINGE 3 ML 20 GAUGE X 1": 60 days supply | Qty: 60 | Fill #0

## 2019-01-05 MED FILL — BD LUER-LOK SYRINGE 3 ML 20 GAUGE X 1": 60 days supply | Qty: 60 | Fill #0 | Status: AC

## 2019-01-05 MED FILL — VALGANCICLOVIR 450 MG TABLET: 90 days supply | Qty: 360 | Fill #0 | Status: AC

## 2019-01-05 MED FILL — PREDNISONE 2.5 MG TABLET: 90 days supply | Qty: 90 | Fill #1 | Status: AC

## 2019-01-23 MED ORDER — PANTOPRAZOLE 40 MG TABLET,DELAYED RELEASE
ORAL_TABLET | 3 refills | 0 days | Status: CP
Start: 2019-01-23 — End: 2020-01-23

## 2019-01-23 MED ORDER — FUROSEMIDE 20 MG TABLET
ORAL_TABLET | Freq: Every day | ORAL | 3 refills | 90 days | Status: CP
Start: 2019-01-23 — End: 2020-01-23
  Filled 2019-01-24: qty 180, 90d supply, fill #0

## 2019-01-23 MED FILL — TACROLIMUS 5 MG CAPSULE: 30 days supply | Qty: 60 | Fill #3 | Status: AC

## 2019-01-23 MED FILL — VENTOLIN HFA 90 MCG/ACTUATION AEROSOL INHALER: 25 days supply | Qty: 18 | Fill #4 | Status: AC

## 2019-01-23 MED FILL — FUROSEMIDE 20 MG TABLET: 90 days supply | Qty: 180 | Fill #0 | Status: AC

## 2019-01-23 MED FILL — SYMBICORT 160 MCG-4.5 MCG/ACTUATION HFA AEROSOL INHALER: 30 days supply | Qty: 10 | Fill #11 | Status: AC

## 2019-01-23 MED FILL — MONTELUKAST 10 MG TABLET: 30 days supply | Qty: 30 | Fill #10 | Status: AC

## 2019-01-23 MED FILL — ATORVASTATIN 20 MG TABLET: 90 days supply | Qty: 90 | Fill #2 | Status: AC

## 2019-01-23 MED FILL — AMIKACIN 500 MG/2 ML INJECTION SOLUTION: 30 days supply | Qty: 60 | Fill #10 | Status: AC

## 2019-01-23 MED FILL — MYCOPHENOLATE MOFETIL 500 MG TABLET: 30 days supply | Qty: 60 | Fill #7 | Status: AC

## 2019-01-24 MED FILL — MYCOPHENOLATE MOFETIL 500 MG TABLET: ORAL | 30 days supply | Qty: 60 | Fill #7

## 2019-01-24 MED FILL — VENTOLIN HFA 90 MCG/ACTUATION AEROSOL INHALER: 25 days supply | Qty: 18 | Fill #4

## 2019-01-24 MED FILL — MONTELUKAST 10 MG TABLET: ORAL | 30 days supply | Qty: 30 | Fill #10

## 2019-01-24 MED FILL — AMIKACIN 500 MG/2 ML INJECTION SOLUTION: 30 days supply | Qty: 60 | Fill #10

## 2019-01-24 MED FILL — SYMBICORT 160 MCG-4.5 MCG/ACTUATION HFA AEROSOL INHALER: RESPIRATORY_TRACT | 30 days supply | Qty: 10.2 | Fill #11

## 2019-01-24 MED FILL — ATORVASTATIN 20 MG TABLET: ORAL | 90 days supply | Qty: 90 | Fill #2

## 2019-01-24 MED FILL — TACROLIMUS 5 MG CAPSULE, IMMEDIATE-RELEASE: ORAL | 30 days supply | Qty: 60 | Fill #3

## 2019-02-03 ENCOUNTER — Encounter: Admit: 2019-02-03 | Discharge: 2019-02-04 | Payer: PRIVATE HEALTH INSURANCE

## 2019-02-03 DIAGNOSIS — Z942 Lung transplant status: Principal | ICD-10-CM

## 2019-02-09 ENCOUNTER — Encounter: Admit: 2019-02-09 | Discharge: 2019-02-10 | Payer: PRIVATE HEALTH INSURANCE | Attending: Family | Primary: Family

## 2019-02-09 MED ORDER — VALGANCICLOVIR 450 MG TABLET
ORAL_TABLET | Freq: Every day | ORAL | 3 refills | 90.00000 days | Status: CP
Start: 2019-02-09 — End: 2019-05-10
  Filled 2019-02-25: qty 90, 90d supply, fill #0

## 2019-02-09 MED ORDER — FUROSEMIDE 20 MG TABLET
ORAL_TABLET | Freq: Every day | ORAL | 3 refills | 90 days | Status: CP
Start: 2019-02-09 — End: 2020-02-09
  Filled 2019-02-25: qty 90, 90d supply, fill #0

## 2019-02-20 ENCOUNTER — Encounter: Admit: 2019-02-20 | Discharge: 2019-02-20 | Payer: PRIVATE HEALTH INSURANCE

## 2019-02-20 ENCOUNTER — Encounter: Admit: 2019-02-20 | Discharge: 2019-02-21 | Payer: PRIVATE HEALTH INSURANCE

## 2019-02-20 DIAGNOSIS — Z20822 Suspected COVID-19 virus infection: Principal | ICD-10-CM

## 2019-02-23 DIAGNOSIS — Z942 Lung transplant status: Principal | ICD-10-CM

## 2019-02-23 MED ORDER — BUDESONIDE-FORMOTEROL HFA 160 MCG-4.5 MCG/ACTUATION AEROSOL INHALER
Freq: Two times a day (BID) | RESPIRATORY_TRACT | 11 refills | 15 days | Status: CP
Start: 2019-02-23 — End: 2020-02-23

## 2019-02-23 MED ORDER — MONTELUKAST 10 MG TABLET
ORAL_TABLET | Freq: Every evening | ORAL | 11 refills | 30 days | Status: CP
Start: 2019-02-23 — End: 2020-02-23
  Filled 2019-02-25: qty 30, 30d supply, fill #0

## 2019-02-23 MED ORDER — AMIKACIN 500 MG/2 ML INJECTION SOLUTION
11 refills | 0 days | Status: CP
Start: 2019-02-23 — End: 2020-02-23
  Filled 2019-03-24: qty 60, 30d supply, fill #0

## 2019-02-25 ENCOUNTER — Encounter: Admit: 2019-02-25 | Discharge: 2019-02-26 | Payer: PRIVATE HEALTH INSURANCE

## 2019-02-25 ENCOUNTER — Encounter: Admit: 2019-02-25 | Discharge: 2019-02-26 | Payer: PRIVATE HEALTH INSURANCE | Attending: Family | Primary: Family

## 2019-02-25 DIAGNOSIS — J329 Chronic sinusitis, unspecified: Principal | ICD-10-CM

## 2019-02-25 DIAGNOSIS — Z942 Lung transplant status: Principal | ICD-10-CM

## 2019-02-25 MED FILL — AZITHROMYCIN 250 MG TABLET: ORAL | 90 days supply | Qty: 90 | Fill #1

## 2019-02-25 MED FILL — VENTOLIN HFA 90 MCG/ACTUATION AEROSOL INHALER: 25 days supply | Qty: 18 | Fill #5

## 2019-02-25 MED FILL — FUROSEMIDE 20 MG TABLET: 90 days supply | Qty: 90 | Fill #0 | Status: AC

## 2019-02-25 MED FILL — TACROLIMUS 1 MG CAPSULE, IMMEDIATE-RELEASE: ORAL | 30 days supply | Qty: 120 | Fill #2

## 2019-02-25 MED FILL — AZITHROMYCIN 250 MG TABLET: 90 days supply | Qty: 90 | Fill #1 | Status: AC

## 2019-02-25 MED FILL — TACROLIMUS 1 MG CAPSULE: 30 days supply | Qty: 120 | Fill #2 | Status: AC

## 2019-02-25 MED FILL — MONTELUKAST 10 MG TABLET: 30 days supply | Qty: 30 | Fill #0 | Status: AC

## 2019-02-25 MED FILL — MYCOPHENOLATE MOFETIL 500 MG TABLET: 30 days supply | Qty: 60 | Fill #8 | Status: AC

## 2019-02-25 MED FILL — TACROLIMUS 5 MG CAPSULE, IMMEDIATE-RELEASE: ORAL | 30 days supply | Qty: 60 | Fill #4

## 2019-02-25 MED FILL — SYMBICORT 160 MCG-4.5 MCG/ACTUATION HFA AEROSOL INHALER: 30 days supply | Qty: 10 | Fill #0 | Status: AC

## 2019-02-25 MED FILL — MYCOPHENOLATE MOFETIL 500 MG TABLET: ORAL | 30 days supply | Qty: 60 | Fill #8

## 2019-02-25 MED FILL — SYMBICORT 160 MCG-4.5 MCG/ACTUATION HFA AEROSOL INHALER: RESPIRATORY_TRACT | 30 days supply | Qty: 10.2 | Fill #0

## 2019-02-25 MED FILL — VENTOLIN HFA 90 MCG/ACTUATION AEROSOL INHALER: 25 days supply | Qty: 18 | Fill #5 | Status: AC

## 2019-02-25 MED FILL — TACROLIMUS 5 MG CAPSULE: 30 days supply | Qty: 60 | Fill #4 | Status: AC

## 2019-02-25 MED FILL — VALGANCICLOVIR 450 MG TABLET: 90 days supply | Qty: 90 | Fill #0 | Status: AC

## 2019-02-27 ENCOUNTER — Ambulatory Visit: Admit: 2019-02-27 | Discharge: 2019-02-27 | Payer: PRIVATE HEALTH INSURANCE

## 2019-02-27 DIAGNOSIS — A498 Other bacterial infections of unspecified site: Principal | ICD-10-CM

## 2019-02-27 MED ORDER — MEROPENEM 1 GRAM/50 ML IN 0.9% SODIUM CHLORIDE INTRAVENOUS PIGGYBACK
Freq: Two times a day (BID) | INTRAVENOUS | 0 refills | 14 days | Status: CP
Start: 2019-02-27 — End: 2019-03-13

## 2019-03-04 DIAGNOSIS — Z942 Lung transplant status: Principal | ICD-10-CM

## 2019-03-04 DIAGNOSIS — B49 Unspecified mycosis: Principal | ICD-10-CM

## 2019-03-04 MED ORDER — TACROLIMUS 5 MG CAPSULE
ORAL_CAPSULE | 3 refills | 0 days
Start: 2019-03-04 — End: ?

## 2019-03-04 MED ORDER — VORICONAZOLE 200 MG TABLET
ORAL_TABLET | ORAL | 0 refills | 31 days | Status: CP
Start: 2019-03-04 — End: 2019-04-04
  Filled 2019-03-05: qty 4, 1d supply, fill #0
  Filled 2019-03-05: qty 60, 30d supply, fill #1

## 2019-03-04 MED ORDER — TACROLIMUS 1 MG CAPSULE
ORAL_CAPSULE | Freq: Two times a day (BID) | ORAL | 3 refills | 90.00000 days | Status: CP
Start: 2019-03-04 — End: 2020-03-03
  Filled 2019-03-24: qty 180, 30d supply, fill #0

## 2019-03-04 MED FILL — VORICONAZOLE 200 MG TABLET: 1 days supply | Qty: 4 | Fill #0 | Status: AC

## 2019-03-05 ENCOUNTER — Ambulatory Visit: Admit: 2019-03-05 | Discharge: 2019-03-06 | Payer: PRIVATE HEALTH INSURANCE

## 2019-03-05 ENCOUNTER — Encounter: Admit: 2019-03-05 | Discharge: 2019-03-06 | Payer: PRIVATE HEALTH INSURANCE

## 2019-03-05 DIAGNOSIS — Z942 Lung transplant status: Principal | ICD-10-CM

## 2019-03-05 DIAGNOSIS — J329 Chronic sinusitis, unspecified: Principal | ICD-10-CM

## 2019-03-05 MED FILL — VORICONAZOLE 200 MG TABLET: 30 days supply | Qty: 60 | Fill #1 | Status: AC

## 2019-03-06 ENCOUNTER — Telehealth
Admit: 2019-03-06 | Discharge: 2019-03-07 | Payer: PRIVATE HEALTH INSURANCE | Attending: Infectious Disease | Primary: Infectious Disease

## 2019-03-06 DIAGNOSIS — B259 Cytomegaloviral disease, unspecified: Principal | ICD-10-CM

## 2019-03-06 DIAGNOSIS — Z942 Lung transplant status: Principal | ICD-10-CM

## 2019-03-06 MED ORDER — VALGANCICLOVIR 450 MG TABLET
ORAL_TABLET | Freq: Every day | ORAL | 3 refills | 90.00000 days | Status: CP
Start: 2019-03-06 — End: 2019-06-04
  Filled 2019-05-27: qty 180, 90d supply, fill #0

## 2019-03-20 ENCOUNTER — Encounter: Admit: 2019-03-20 | Discharge: 2019-03-20 | Payer: PRIVATE HEALTH INSURANCE

## 2019-03-20 DIAGNOSIS — Z942 Lung transplant status: Principal | ICD-10-CM

## 2019-03-24 MED FILL — MYCOPHENOLATE MOFETIL 500 MG TABLET: ORAL | 30 days supply | Qty: 60 | Fill #9

## 2019-03-24 MED FILL — PREDNISONE 2.5 MG TABLET: ORAL | 90 days supply | Qty: 90 | Fill #2

## 2019-03-24 MED FILL — AMIKACIN 500 MG/2 ML INJECTION SOLUTION: 30 days supply | Qty: 60 | Fill #0 | Status: AC

## 2019-03-24 MED FILL — SYMBICORT 160 MCG-4.5 MCG/ACTUATION HFA AEROSOL INHALER: 30 days supply | Qty: 10 | Fill #1 | Status: AC

## 2019-03-24 MED FILL — HYDROCHLOROTHIAZIDE 12.5 MG TABLET: 80 days supply | Qty: 80 | Fill #3 | Status: AC

## 2019-03-24 MED FILL — PREDNISONE 5 MG TABLET: 90 days supply | Qty: 90 | Fill #3 | Status: AC

## 2019-03-24 MED FILL — VENTOLIN HFA 90 MCG/ACTUATION AEROSOL INHALER: 25 days supply | Qty: 18 | Fill #6

## 2019-03-24 MED FILL — SYMBICORT 160 MCG-4.5 MCG/ACTUATION HFA AEROSOL INHALER: RESPIRATORY_TRACT | 30 days supply | Qty: 10.2 | Fill #1

## 2019-03-24 MED FILL — HYDROCHLOROTHIAZIDE 12.5 MG TABLET: ORAL | 80 days supply | Qty: 80 | Fill #3

## 2019-03-24 MED FILL — MYCOPHENOLATE MOFETIL 500 MG TABLET: 30 days supply | Qty: 60 | Fill #9 | Status: AC

## 2019-03-24 MED FILL — PREDNISONE 2.5 MG TABLET: 90 days supply | Qty: 90 | Fill #2 | Status: AC

## 2019-03-24 MED FILL — TACROLIMUS 1 MG CAPSULE: 30 days supply | Qty: 180 | Fill #0 | Status: AC

## 2019-03-24 MED FILL — PREDNISONE 5 MG TABLET: 90 days supply | Qty: 90 | Fill #3

## 2019-03-24 MED FILL — VENTOLIN HFA 90 MCG/ACTUATION AEROSOL INHALER: 25 days supply | Qty: 18 | Fill #6 | Status: AC

## 2019-03-27 DIAGNOSIS — Z942 Lung transplant status: Principal | ICD-10-CM

## 2019-03-27 MED ORDER — TACROLIMUS 5 MG CAPSULE
ORAL_CAPSULE | Freq: Two times a day (BID) | ORAL | 3 refills | 90 days | Status: CP
Start: 2019-03-27 — End: ?
  Filled 2019-04-01: qty 60, 30d supply, fill #0

## 2019-04-01 MED FILL — TACROLIMUS 5 MG CAPSULE: 30 days supply | Qty: 60 | Fill #0 | Status: AC

## 2019-04-24 MED FILL — SYMBICORT 160 MCG-4.5 MCG/ACTUATION HFA AEROSOL INHALER: 30 days supply | Qty: 10 | Fill #2 | Status: AC

## 2019-04-24 MED FILL — AMIKACIN 500 MG/2 ML INJECTION SOLUTION: 30 days supply | Qty: 60 | Fill #1 | Status: AC

## 2019-04-24 MED FILL — MYCOPHENOLATE MOFETIL 500 MG TABLET: 30 days supply | Qty: 60 | Fill #10 | Status: AC

## 2019-04-24 MED FILL — TACROLIMUS 5 MG CAPSULE, IMMEDIATE-RELEASE: 30 days supply | Qty: 60 | Fill #1 | Status: AC

## 2019-04-24 MED FILL — BD LUER-LOK SYRINGE 3 ML 20 GAUGE X 1": 30 days supply | Qty: 30 | Fill #1 | Status: AC

## 2019-04-24 MED FILL — PANTOPRAZOLE 40 MG TABLET,DELAYED RELEASE: 90 days supply | Qty: 90 | Fill #0 | Status: AC

## 2019-04-24 MED FILL — ATORVASTATIN 20 MG TABLET: 90 days supply | Qty: 90 | Fill #3 | Status: AC

## 2019-04-24 MED FILL — VENTOLIN HFA 90 MCG/ACTUATION AEROSOL INHALER: 25 days supply | Qty: 18 | Fill #7 | Status: AC

## 2019-04-25 MED FILL — PANTOPRAZOLE 40 MG TABLET,DELAYED RELEASE: 90 days supply | Qty: 90 | Fill #0

## 2019-04-25 MED FILL — ATORVASTATIN 20 MG TABLET: ORAL | 90 days supply | Qty: 90 | Fill #3

## 2019-04-25 MED FILL — BD LUER-LOK SYRINGE 3 ML 20 GAUGE X 1": 30 days supply | Qty: 30 | Fill #1

## 2019-04-25 MED FILL — VENTOLIN HFA 90 MCG/ACTUATION AEROSOL INHALER: 25 days supply | Qty: 18 | Fill #7

## 2019-04-25 MED FILL — TACROLIMUS 5 MG CAPSULE, IMMEDIATE-RELEASE: ORAL | 30 days supply | Qty: 60 | Fill #1

## 2019-04-25 MED FILL — AMIKACIN 500 MG/2 ML INJECTION SOLUTION: 30 days supply | Qty: 60 | Fill #1

## 2019-04-25 MED FILL — MYCOPHENOLATE MOFETIL 500 MG TABLET: ORAL | 30 days supply | Qty: 60 | Fill #10

## 2019-04-25 MED FILL — SYMBICORT 160 MCG-4.5 MCG/ACTUATION HFA AEROSOL INHALER: RESPIRATORY_TRACT | 30 days supply | Qty: 10.2 | Fill #2

## 2019-04-29 DIAGNOSIS — Z942 Lung transplant status: Principal | ICD-10-CM

## 2019-05-04 ENCOUNTER — Encounter: Admit: 2019-05-04 | Discharge: 2019-05-05 | Payer: PRIVATE HEALTH INSURANCE

## 2019-05-05 DIAGNOSIS — Z942 Lung transplant status: Principal | ICD-10-CM

## 2019-05-05 DIAGNOSIS — M8580 Other specified disorders of bone density and structure, unspecified site: Principal | ICD-10-CM

## 2019-05-15 ENCOUNTER — Ambulatory Visit: Admit: 2019-05-15 | Discharge: 2019-05-28 | Payer: PRIVATE HEALTH INSURANCE | Attending: Family | Primary: Family

## 2019-05-15 ENCOUNTER — Encounter: Admit: 2019-05-15 | Discharge: 2019-05-28 | Payer: PRIVATE HEALTH INSURANCE | Attending: Family | Primary: Family

## 2019-05-15 ENCOUNTER — Other Ambulatory Visit: Admit: 2019-05-15 | Discharge: 2019-05-28 | Payer: PRIVATE HEALTH INSURANCE

## 2019-05-15 ENCOUNTER — Encounter: Admit: 2019-05-15 | Discharge: 2019-05-28 | Payer: PRIVATE HEALTH INSURANCE

## 2019-05-15 ENCOUNTER — Ambulatory Visit: Admit: 2019-05-15 | Discharge: 2019-05-28 | Payer: PRIVATE HEALTH INSURANCE

## 2019-05-15 DIAGNOSIS — Z942 Lung transplant status: Principal | ICD-10-CM

## 2019-05-15 DIAGNOSIS — Z1211 Encounter for screening for malignant neoplasm of colon: Principal | ICD-10-CM

## 2019-05-15 MED ORDER — MYCOPHENOLATE MOFETIL 500 MG TABLET
ORAL_TABLET | Freq: Two times a day (BID) | ORAL | 3 refills | 90.00000 days | Status: CP
Start: 2019-05-15 — End: 2019-08-13
  Filled 2019-05-22: qty 60, 30d supply, fill #0

## 2019-05-15 MED ORDER — TACROLIMUS 1 MG CAPSULE, IMMEDIATE-RELEASE
ORAL_CAPSULE | Freq: Two times a day (BID) | ORAL | 3 refills | 90.00000 days | Status: CP
Start: 2019-05-15 — End: 2020-05-14
  Filled 2019-05-22: qty 120, 30d supply, fill #0

## 2019-05-15 MED ORDER — PANTOPRAZOLE 40 MG TABLET,DELAYED RELEASE
ORAL_TABLET | Freq: Two times a day (BID) | ORAL | 3 refills | 90 days | Status: CP
Start: 2019-05-15 — End: 2020-05-14
  Filled 2019-05-27: qty 180, 90d supply, fill #0

## 2019-05-18 MED ORDER — CHOLECALCIFEROL (VITAMIN D3) 25 MCG (1,000 UNIT) TABLET
ORAL_TABLET | Freq: Every day | ORAL | 3 refills | 100 days | Status: CP
Start: 2019-05-18 — End: 2020-05-17
  Filled 2019-05-22: qty 100, 100d supply, fill #0

## 2019-05-22 ENCOUNTER — Encounter
Admit: 2019-05-22 | Discharge: 2019-05-23 | Payer: PRIVATE HEALTH INSURANCE | Attending: Infectious Disease | Primary: Infectious Disease

## 2019-05-22 MED FILL — CHOLECALCIFEROL (VITAMIN D3) 25 MCG (1,000 UNIT) TABLET: 100 days supply | Qty: 100 | Fill #0 | Status: AC

## 2019-05-22 MED FILL — MYCOPHENOLATE MOFETIL 500 MG TABLET: 30 days supply | Qty: 60 | Fill #0 | Status: AC

## 2019-05-22 MED FILL — MONTELUKAST 10 MG TABLET: 30 days supply | Qty: 30 | Fill #1 | Status: AC

## 2019-05-22 MED FILL — MONTELUKAST 10 MG TABLET: ORAL | 30 days supply | Qty: 30 | Fill #1

## 2019-05-22 MED FILL — TACROLIMUS 1 MG CAPSULE, IMMEDIATE-RELEASE: 30 days supply | Qty: 120 | Fill #0 | Status: AC

## 2019-05-27 MED FILL — SYMBICORT 160 MCG-4.5 MCG/ACTUATION HFA AEROSOL INHALER: RESPIRATORY_TRACT | 30 days supply | Qty: 10.2 | Fill #3

## 2019-05-27 MED FILL — AZITHROMYCIN 250 MG TABLET: 30 days supply | Qty: 30 | Fill #2 | Status: AC

## 2019-05-27 MED FILL — AZITHROMYCIN 250 MG TABLET: ORAL | 30 days supply | Qty: 30 | Fill #2

## 2019-05-27 MED FILL — VALGANCICLOVIR 450 MG TABLET: 90 days supply | Qty: 180 | Fill #0 | Status: AC

## 2019-05-27 MED FILL — SYMBICORT 160 MCG-4.5 MCG/ACTUATION HFA AEROSOL INHALER: 30 days supply | Qty: 10 | Fill #3 | Status: AC

## 2019-05-27 MED FILL — PANTOPRAZOLE 40 MG TABLET,DELAYED RELEASE: 90 days supply | Qty: 180 | Fill #0 | Status: AC

## 2019-06-01 MED FILL — AMIKACIN 500 MG/2 ML INJECTION SOLUTION: 30 days supply | Qty: 120 | Fill #2 | Status: AC

## 2019-06-01 MED FILL — ALBUTEROL SULFATE HFA 90 MCG/ACTUATION AEROSOL INHALER: 25 days supply | Qty: 7 | Fill #8 | Status: AC

## 2019-06-01 MED FILL — BD LUER-LOK SYRINGE 3 ML 20 GAUGE X 1 1/2": 30 days supply | Qty: 30 | Fill #0 | Status: AC

## 2019-06-01 MED FILL — AMIKACIN 500 MG/2 ML INJECTION SOLUTION: 30 days supply | Qty: 120 | Fill #2

## 2019-06-01 MED FILL — BD LUER-LOK SYRINGE 3 ML 20 GAUGE X 1 1/2": 30 days supply | Qty: 30 | Fill #0

## 2019-06-01 MED FILL — ALBUTEROL SULFATE HFA 90 MCG/ACTUATION AEROSOL INHALER: 25 days supply | Qty: 6.7 | Fill #8

## 2019-06-04 MED ORDER — HYDROCHLOROTHIAZIDE 12.5 MG TABLET
ORAL_TABLET | Freq: Every day | ORAL | 3 refills | 90 days | Status: CP
Start: 2019-06-04 — End: 2020-06-03
  Filled 2019-06-04: qty 90, 90d supply, fill #0

## 2019-06-04 MED FILL — HYDROCHLOROTHIAZIDE 12.5 MG TABLET: 90 days supply | Qty: 90 | Fill #0 | Status: AC

## 2019-06-04 MED FILL — TACROLIMUS 5 MG CAPSULE, IMMEDIATE-RELEASE: 30 days supply | Qty: 60 | Fill #2 | Status: AC

## 2019-06-04 MED FILL — TACROLIMUS 5 MG CAPSULE, IMMEDIATE-RELEASE: ORAL | 30 days supply | Qty: 60 | Fill #2

## 2019-06-24 MED ORDER — PREDNISONE 5 MG TABLET
ORAL_TABLET | 3 refills | 0 days | Status: CP
Start: 2019-06-24 — End: 2020-06-23
  Filled 2019-06-26: qty 90, 90d supply, fill #0

## 2019-06-24 MED FILL — ALBUTEROL SULFATE HFA 90 MCG/ACTUATION AEROSOL INHALER: 25 days supply | Qty: 7 | Fill #9 | Status: AC

## 2019-06-24 MED FILL — MONTELUKAST 10 MG TABLET: 30 days supply | Qty: 30 | Fill #2 | Status: AC

## 2019-06-24 MED FILL — MYCOPHENOLATE MOFETIL 500 MG TABLET: 30 days supply | Qty: 60 | Fill #1 | Status: AC

## 2019-06-24 MED FILL — AZITHROMYCIN 250 MG TABLET: 30 days supply | Qty: 30 | Fill #3 | Status: AC

## 2019-06-24 MED FILL — ALBUTEROL SULFATE HFA 90 MCG/ACTUATION AEROSOL INHALER: 25 days supply | Qty: 6.7 | Fill #9

## 2019-06-24 MED FILL — AZITHROMYCIN 250 MG TABLET: ORAL | 30 days supply | Qty: 30 | Fill #3

## 2019-06-24 MED FILL — MYCOPHENOLATE MOFETIL 500 MG TABLET: ORAL | 30 days supply | Qty: 60 | Fill #1

## 2019-06-24 MED FILL — MONTELUKAST 10 MG TABLET: ORAL | 30 days supply | Qty: 30 | Fill #2

## 2019-06-25 MED ORDER — ALBUTEROL SULFATE 2.5 MG/3 ML (0.083 %) SOLUTION FOR NEBULIZATION
RESPIRATORY_TRACT | 11 refills | 5 days | Status: CP | PRN
Start: 2019-06-25 — End: 2020-06-24
  Filled 2019-06-26: qty 90, 8d supply, fill #0

## 2019-06-26 MED FILL — SYMBICORT 160 MCG-4.5 MCG/ACTUATION HFA AEROSOL INHALER: 30 days supply | Qty: 10 | Fill #4 | Status: AC

## 2019-06-26 MED FILL — ALBUTEROL SULFATE 2.5 MG/3 ML (0.083 %) SOLUTION FOR NEBULIZATION: 8 days supply | Qty: 90 | Fill #0 | Status: AC

## 2019-06-26 MED FILL — PREDNISONE 2.5 MG TABLET: ORAL | 90 days supply | Qty: 90 | Fill #3

## 2019-06-26 MED FILL — SYMBICORT 160 MCG-4.5 MCG/ACTUATION HFA AEROSOL INHALER: RESPIRATORY_TRACT | 30 days supply | Qty: 10.2 | Fill #4

## 2019-06-26 MED FILL — BD LUER-LOK SYRINGE 3 ML 21 GAUGE X 1": 30 days supply | Qty: 30 | Fill #0 | Status: AC

## 2019-06-26 MED FILL — PREDNISONE 5 MG TABLET: 90 days supply | Qty: 90 | Fill #0 | Status: AC

## 2019-06-26 MED FILL — BD LUER-LOK SYRINGE 3 ML 21 GAUGE X 1": 30 days supply | Qty: 30 | Fill #0

## 2019-06-26 MED FILL — PREDNISONE 2.5 MG TABLET: 90 days supply | Qty: 90 | Fill #3 | Status: AC

## 2019-07-06 MED ORDER — PREDNISONE 2.5 MG TABLET
ORAL_TABLET | Freq: Every day | ORAL | 3 refills | 90 days | Status: CP
Start: 2019-07-06 — End: ?
  Filled 2019-09-25: qty 90, 90d supply, fill #0

## 2019-07-06 MED FILL — TACROLIMUS 5 MG CAPSULE, IMMEDIATE-RELEASE: ORAL | 30 days supply | Qty: 60 | Fill #3

## 2019-07-06 MED FILL — TACROLIMUS 5 MG CAPSULE, IMMEDIATE-RELEASE: 30 days supply | Qty: 60 | Fill #3 | Status: AC

## 2019-07-24 MED ORDER — ATORVASTATIN 20 MG TABLET
ORAL_TABLET | Freq: Every day | ORAL | 3 refills | 90.00000 days | Status: CP
Start: 2019-07-24 — End: 2019-07-24
  Filled 2019-07-28: qty 90, 90d supply, fill #0

## 2019-07-24 MED ORDER — ATORVASTATIN 20 MG TABLET: 20 mg | tablet | Freq: Every day | 3 refills | 90 days | Status: AC

## 2019-07-24 MED FILL — MYCOPHENOLATE MOFETIL 500 MG TABLET: ORAL | 30 days supply | Qty: 60 | Fill #2

## 2019-07-24 MED FILL — AZITHROMYCIN 250 MG TABLET: ORAL | 30 days supply | Qty: 30 | Fill #4

## 2019-07-24 MED FILL — MYCOPHENOLATE MOFETIL 500 MG TABLET: 30 days supply | Qty: 60 | Fill #2 | Status: AC

## 2019-07-24 MED FILL — MONTELUKAST 10 MG TABLET: 30 days supply | Qty: 30 | Fill #3 | Status: AC

## 2019-07-24 MED FILL — MONTELUKAST 10 MG TABLET: ORAL | 30 days supply | Qty: 30 | Fill #3

## 2019-07-24 MED FILL — AZITHROMYCIN 250 MG TABLET: 30 days supply | Qty: 30 | Fill #4 | Status: AC

## 2019-07-28 MED FILL — ALBUTEROL SULFATE HFA 90 MCG/ACTUATION AEROSOL INHALER: 25 days supply | Qty: 7 | Fill #10 | Status: AC

## 2019-07-28 MED FILL — ALBUTEROL SULFATE HFA 90 MCG/ACTUATION AEROSOL INHALER: 25 days supply | Qty: 6.7 | Fill #10

## 2019-07-28 MED FILL — ATORVASTATIN 20 MG TABLET: 90 days supply | Qty: 90 | Fill #0 | Status: AC

## 2019-07-28 MED FILL — SYMBICORT 160 MCG-4.5 MCG/ACTUATION HFA AEROSOL INHALER: 30 days supply | Qty: 10 | Fill #5 | Status: AC

## 2019-07-28 MED FILL — AMIKACIN 500 MG/2 ML INJECTION SOLUTION: 29 days supply | Qty: 118 | Fill #3

## 2019-07-28 MED FILL — SYMBICORT 160 MCG-4.5 MCG/ACTUATION HFA AEROSOL INHALER: RESPIRATORY_TRACT | 30 days supply | Qty: 10.2 | Fill #5

## 2019-07-28 MED FILL — AMIKACIN 500 MG/2 ML INJECTION SOLUTION: 29 days supply | Qty: 118 | Fill #3 | Status: AC

## 2019-08-03 ENCOUNTER — Ambulatory Visit: Admit: 2019-08-03 | Discharge: 2019-08-04 | Payer: PRIVATE HEALTH INSURANCE

## 2019-08-03 DIAGNOSIS — J310 Chronic rhinitis: Principal | ICD-10-CM

## 2019-08-03 DIAGNOSIS — J329 Chronic sinusitis, unspecified: Principal | ICD-10-CM

## 2019-08-03 DIAGNOSIS — J339 Nasal polyp, unspecified: Principal | ICD-10-CM

## 2019-08-03 MED FILL — ALBUTEROL SULFATE 2.5 MG/3 ML (0.083 %) SOLUTION FOR NEBULIZATION: RESPIRATORY_TRACT | 8 days supply | Qty: 90 | Fill #1

## 2019-08-03 MED FILL — TACROLIMUS 1 MG CAPSULE, IMMEDIATE-RELEASE: 30 days supply | Qty: 120 | Fill #1 | Status: AC

## 2019-08-03 MED FILL — TACROLIMUS 5 MG CAPSULE, IMMEDIATE-RELEASE: 30 days supply | Qty: 60 | Fill #4 | Status: AC

## 2019-08-03 MED FILL — TACROLIMUS 1 MG CAPSULE, IMMEDIATE-RELEASE: ORAL | 30 days supply | Qty: 120 | Fill #1

## 2019-08-03 MED FILL — ALBUTEROL SULFATE 2.5 MG/3 ML (0.083 %) SOLUTION FOR NEBULIZATION: 8 days supply | Qty: 90 | Fill #1 | Status: AC

## 2019-08-03 MED FILL — TACROLIMUS 5 MG CAPSULE, IMMEDIATE-RELEASE: ORAL | 30 days supply | Qty: 60 | Fill #4

## 2019-08-04 DIAGNOSIS — Z942 Lung transplant status: Principal | ICD-10-CM

## 2019-08-14 ENCOUNTER — Ambulatory Visit: Admit: 2019-08-14 | Discharge: 2019-08-14 | Payer: PRIVATE HEALTH INSURANCE

## 2019-08-14 ENCOUNTER — Ambulatory Visit: Admit: 2019-08-14 | Discharge: 2019-08-15 | Payer: PRIVATE HEALTH INSURANCE

## 2019-08-14 ENCOUNTER — Ambulatory Visit: Admit: 2019-08-14 | Discharge: 2019-08-14 | Payer: PRIVATE HEALTH INSURANCE | Attending: Family | Primary: Family

## 2019-08-14 ENCOUNTER — Encounter: Admit: 2019-08-14 | Discharge: 2019-08-20 | Payer: PRIVATE HEALTH INSURANCE

## 2019-08-14 DIAGNOSIS — Z942 Lung transplant status: Principal | ICD-10-CM

## 2019-08-14 DIAGNOSIS — R5383 Other fatigue: Principal | ICD-10-CM

## 2019-08-24 MED FILL — MYCOPHENOLATE MOFETIL 500 MG TABLET: 30 days supply | Qty: 60 | Fill #3 | Status: AC

## 2019-08-24 MED FILL — MYCOPHENOLATE MOFETIL 500 MG TABLET: ORAL | 30 days supply | Qty: 60 | Fill #3

## 2019-08-25 DIAGNOSIS — Z942 Lung transplant status: Principal | ICD-10-CM

## 2019-08-25 MED ORDER — AZITHROMYCIN 250 MG TABLET
ORAL_TABLET | Freq: Every day | ORAL | 3 refills | 90 days | Status: CP
Start: 2019-08-25 — End: 2019-11-23
  Filled 2019-08-25: qty 90, 90d supply, fill #0

## 2019-08-25 MED FILL — AZITHROMYCIN 250 MG TABLET: 90 days supply | Qty: 90 | Fill #0 | Status: AC

## 2019-08-25 MED FILL — MONTELUKAST 10 MG TABLET: ORAL | 30 days supply | Qty: 30 | Fill #4

## 2019-08-25 MED FILL — MONTELUKAST 10 MG TABLET: 30 days supply | Qty: 30 | Fill #4 | Status: AC

## 2019-08-28 ENCOUNTER — Ambulatory Visit: Admit: 2019-08-28 | Discharge: 2019-08-29 | Payer: PRIVATE HEALTH INSURANCE

## 2019-08-28 DIAGNOSIS — Z942 Lung transplant status: Principal | ICD-10-CM

## 2019-09-04 MED FILL — TACROLIMUS 5 MG CAPSULE, IMMEDIATE-RELEASE: 30 days supply | Qty: 60 | Fill #5 | Status: AC

## 2019-09-04 MED FILL — HYDROCHLOROTHIAZIDE 12.5 MG TABLET: 90 days supply | Qty: 90 | Fill #1 | Status: AC

## 2019-09-04 MED FILL — PANTOPRAZOLE 40 MG TABLET,DELAYED RELEASE: ORAL | 90 days supply | Qty: 180 | Fill #1

## 2019-09-04 MED FILL — HYDROCHLOROTHIAZIDE 12.5 MG TABLET: ORAL | 90 days supply | Qty: 90 | Fill #1

## 2019-09-04 MED FILL — ALBUTEROL SULFATE HFA 90 MCG/ACTUATION AEROSOL INHALER: 25 days supply | Qty: 6.7 | Fill #11

## 2019-09-04 MED FILL — TACROLIMUS 1 MG CAPSULE, IMMEDIATE-RELEASE: 30 days supply | Qty: 120 | Fill #2 | Status: AC

## 2019-09-04 MED FILL — ALBUTEROL SULFATE HFA 90 MCG/ACTUATION AEROSOL INHALER: 25 days supply | Qty: 7 | Fill #11 | Status: AC

## 2019-09-04 MED FILL — ALBUTEROL SULFATE 2.5 MG/3 ML (0.083 %) SOLUTION FOR NEBULIZATION: 8 days supply | Qty: 90 | Fill #2 | Status: AC

## 2019-09-04 MED FILL — TACROLIMUS 5 MG CAPSULE, IMMEDIATE-RELEASE: ORAL | 30 days supply | Qty: 60 | Fill #5

## 2019-09-04 MED FILL — SYMBICORT 160 MCG-4.5 MCG/ACTUATION HFA AEROSOL INHALER: RESPIRATORY_TRACT | 30 days supply | Qty: 10.2 | Fill #6

## 2019-09-04 MED FILL — PANTOPRAZOLE 40 MG TABLET,DELAYED RELEASE: 90 days supply | Qty: 180 | Fill #1 | Status: AC

## 2019-09-04 MED FILL — ALBUTEROL SULFATE 2.5 MG/3 ML (0.083 %) SOLUTION FOR NEBULIZATION: RESPIRATORY_TRACT | 8 days supply | Qty: 90 | Fill #2

## 2019-09-04 MED FILL — SYMBICORT 160 MCG-4.5 MCG/ACTUATION HFA AEROSOL INHALER: 30 days supply | Qty: 10 | Fill #6 | Status: AC

## 2019-09-04 MED FILL — BD LUER-LOK SYRINGE 3 ML 21 GAUGE X 1": 45 days supply | Qty: 45 | Fill #1

## 2019-09-04 MED FILL — CHOLECALCIFEROL (VITAMIN D3) 25 MCG (1,000 UNIT) TABLET: ORAL | 100 days supply | Qty: 100 | Fill #1

## 2019-09-04 MED FILL — FUROSEMIDE 20 MG TABLET: 90 days supply | Qty: 90 | Fill #1 | Status: AC

## 2019-09-04 MED FILL — BD LUER-LOK SYRINGE 3 ML 21 GAUGE X 1": 45 days supply | Qty: 45 | Fill #1 | Status: AC

## 2019-09-04 MED FILL — AMIKACIN 500 MG/2 ML INJECTION SOLUTION: 29 days supply | Qty: 118 | Fill #4

## 2019-09-04 MED FILL — TACROLIMUS 1 MG CAPSULE, IMMEDIATE-RELEASE: ORAL | 30 days supply | Qty: 120 | Fill #2

## 2019-09-04 MED FILL — VALGANCICLOVIR 450 MG TABLET: 90 days supply | Qty: 180 | Fill #1 | Status: AC

## 2019-09-04 MED FILL — AMIKACIN 500 MG/2 ML INJECTION SOLUTION: 29 days supply | Qty: 118 | Fill #4 | Status: AC

## 2019-09-04 MED FILL — FUROSEMIDE 20 MG TABLET: ORAL | 90 days supply | Qty: 90 | Fill #1

## 2019-09-04 MED FILL — CHOLECALCIFEROL (VITAMIN D3) 25 MCG (1,000 UNIT) TABLET: 100 days supply | Qty: 100 | Fill #1 | Status: AC

## 2019-09-04 MED FILL — VALGANCICLOVIR 450 MG TABLET: ORAL | 90 days supply | Qty: 180 | Fill #1

## 2019-09-22 MED FILL — MYCOPHENOLATE MOFETIL 500 MG TABLET: ORAL | 30 days supply | Qty: 60 | Fill #4

## 2019-09-22 MED FILL — MYCOPHENOLATE MOFETIL 500 MG TABLET: 30 days supply | Qty: 60 | Fill #4 | Status: AC

## 2019-09-25 MED FILL — ALBUTEROL SULFATE HFA 90 MCG/ACTUATION AEROSOL INHALER: 25 days supply | Qty: 7 | Fill #12 | Status: AC

## 2019-09-25 MED FILL — ALBUTEROL SULFATE HFA 90 MCG/ACTUATION AEROSOL INHALER: 25 days supply | Qty: 6.7 | Fill #12

## 2019-09-25 MED FILL — MONTELUKAST 10 MG TABLET: ORAL | 30 days supply | Qty: 30 | Fill #5

## 2019-09-25 MED FILL — PREDNISONE 5 MG TABLET: 90 days supply | Qty: 90 | Fill #1

## 2019-09-25 MED FILL — PREDNISONE 5 MG TABLET: 90 days supply | Qty: 90 | Fill #1 | Status: AC

## 2019-09-25 MED FILL — MONTELUKAST 10 MG TABLET: 30 days supply | Qty: 30 | Fill #5 | Status: AC

## 2019-09-25 MED FILL — PREDNISONE 2.5 MG TABLET: 90 days supply | Qty: 90 | Fill #0 | Status: AC

## 2019-10-02 ENCOUNTER — Encounter: Admit: 2019-10-02 | Discharge: 2019-10-03 | Payer: PRIVATE HEALTH INSURANCE

## 2019-10-06 DIAGNOSIS — M109 Gout, unspecified: Principal | ICD-10-CM

## 2019-10-06 MED FILL — TACROLIMUS 5 MG CAPSULE, IMMEDIATE-RELEASE: ORAL | 30 days supply | Qty: 60 | Fill #6

## 2019-10-06 MED FILL — TACROLIMUS 5 MG CAPSULE, IMMEDIATE-RELEASE: 30 days supply | Qty: 60 | Fill #6 | Status: AC

## 2019-10-07 ENCOUNTER — Encounter: Admit: 2019-10-07 | Discharge: 2019-10-08 | Payer: PRIVATE HEALTH INSURANCE

## 2019-10-07 MED ORDER — COLCHICINE 0.6 MG TABLET
ORAL_TABLET | Freq: Every day | ORAL | 1 refills | 14 days | Status: CP | PRN
Start: 2019-10-07 — End: 2020-10-06
  Filled 2019-10-07: qty 14, 14d supply, fill #0

## 2019-10-07 MED FILL — COLCHICINE 0.6 MG TABLET: 14 days supply | Qty: 14 | Fill #0 | Status: AC

## 2019-10-07 MED FILL — TACROLIMUS 1 MG CAPSULE, IMMEDIATE-RELEASE: ORAL | 30 days supply | Qty: 120 | Fill #3

## 2019-10-07 MED FILL — SYMBICORT 160 MCG-4.5 MCG/ACTUATION HFA AEROSOL INHALER: RESPIRATORY_TRACT | 30 days supply | Qty: 10.2 | Fill #7

## 2019-10-07 MED FILL — SYMBICORT 160 MCG-4.5 MCG/ACTUATION HFA AEROSOL INHALER: 30 days supply | Qty: 10 | Fill #7 | Status: AC

## 2019-10-07 MED FILL — ALBUTEROL SULFATE 2.5 MG/3 ML (0.083 %) SOLUTION FOR NEBULIZATION: 8 days supply | Qty: 90 | Fill #3 | Status: AC

## 2019-10-07 MED FILL — ALBUTEROL SULFATE 2.5 MG/3 ML (0.083 %) SOLUTION FOR NEBULIZATION: RESPIRATORY_TRACT | 8 days supply | Qty: 90 | Fill #3

## 2019-10-07 MED FILL — TACROLIMUS 1 MG CAPSULE, IMMEDIATE-RELEASE: 30 days supply | Qty: 120 | Fill #3 | Status: AC

## 2019-10-09 DIAGNOSIS — Z942 Lung transplant status: Principal | ICD-10-CM

## 2019-10-09 MED ORDER — VALSARTAN 40 MG TABLET
ORAL_TABLET | Freq: Every day | ORAL | 3 refills | 90 days | Status: CP
Start: 2019-10-09 — End: 2020-10-08
  Filled 2019-10-09: qty 90, 90d supply, fill #0

## 2019-10-09 MED FILL — AMIKACIN 500 MG/2 ML INJECTION SOLUTION: 30 days supply | Qty: 120 | Fill #5 | Status: AC

## 2019-10-09 MED FILL — VALSARTAN 40 MG TABLET: 90 days supply | Qty: 90 | Fill #0 | Status: AC

## 2019-10-09 MED FILL — AMIKACIN 500 MG/2 ML INJECTION SOLUTION: 30 days supply | Qty: 120 | Fill #5

## 2019-10-16 ENCOUNTER — Non-Acute Institutional Stay: Admit: 2019-10-16 | Discharge: 2019-10-17 | Payer: PRIVATE HEALTH INSURANCE

## 2019-10-21 MED FILL — MYCOPHENOLATE MOFETIL 500 MG TABLET: 30 days supply | Qty: 60 | Fill #5 | Status: AC

## 2019-10-21 MED FILL — MONTELUKAST 10 MG TABLET: 30 days supply | Qty: 30 | Fill #6 | Status: AC

## 2019-10-21 MED FILL — ATORVASTATIN 20 MG TABLET: ORAL | 90 days supply | Qty: 90 | Fill #1

## 2019-10-21 MED FILL — ATORVASTATIN 20 MG TABLET: 90 days supply | Qty: 90 | Fill #1 | Status: AC

## 2019-10-21 MED FILL — MYCOPHENOLATE MOFETIL 500 MG TABLET: ORAL | 30 days supply | Qty: 60 | Fill #5

## 2019-10-21 MED FILL — MONTELUKAST 10 MG TABLET: ORAL | 30 days supply | Qty: 30 | Fill #6

## 2019-10-24 ENCOUNTER — Ambulatory Visit: Admit: 2019-10-24 | Discharge: 2019-10-25 | Payer: PRIVATE HEALTH INSURANCE

## 2019-10-27 DIAGNOSIS — S51859A Open bite of unspecified forearm, initial encounter: Principal | ICD-10-CM

## 2019-10-27 DIAGNOSIS — Z942 Lung transplant status: Principal | ICD-10-CM

## 2019-10-27 DIAGNOSIS — W5501XA Bitten by cat, initial encounter: Principal | ICD-10-CM

## 2019-10-27 MED ORDER — DOXYCYCLINE HYCLATE 100 MG TABLET
ORAL_TABLET | Freq: Two times a day (BID) | ORAL | 0 refills | 14.00000 days | Status: CP
Start: 2019-10-27 — End: 2019-11-10

## 2019-10-27 MED ORDER — AMOXICILLIN 875 MG-POTASSIUM CLAVULANATE 125 MG TABLET
ORAL_TABLET | Freq: Two times a day (BID) | ORAL | 0 refills | 14 days | Status: CP
Start: 2019-10-27 — End: 2019-11-10

## 2019-11-04 DIAGNOSIS — Z942 Lung transplant status: Principal | ICD-10-CM

## 2019-11-04 MED ORDER — ALBUTEROL SULFATE HFA 90 MCG/ACTUATION AEROSOL INHALER
11 refills | 0 days | Status: CP
Start: 2019-11-04 — End: 2020-11-03
  Filled 2019-11-10: qty 8.5, 25d supply, fill #0

## 2019-11-10 MED FILL — BD LUER-LOK SYRINGE 3 ML 21 GAUGE X 1": 45 days supply | Qty: 45 | Fill #2

## 2019-11-10 MED FILL — TACROLIMUS 1 MG CAPSULE, IMMEDIATE-RELEASE: ORAL | 30 days supply | Qty: 120 | Fill #4

## 2019-11-10 MED FILL — TACROLIMUS 5 MG CAPSULE, IMMEDIATE-RELEASE: 30 days supply | Qty: 60 | Fill #7 | Status: AC

## 2019-11-10 MED FILL — TACROLIMUS 1 MG CAPSULE, IMMEDIATE-RELEASE: 30 days supply | Qty: 120 | Fill #4 | Status: AC

## 2019-11-10 MED FILL — SYMBICORT 160 MCG-4.5 MCG/ACTUATION HFA AEROSOL INHALER: RESPIRATORY_TRACT | 30 days supply | Qty: 10.2 | Fill #8

## 2019-11-10 MED FILL — BD LUER-LOK SYRINGE 3 ML 21 GAUGE X 1": 45 days supply | Qty: 45 | Fill #2 | Status: AC

## 2019-11-10 MED FILL — SYMBICORT 160 MCG-4.5 MCG/ACTUATION HFA AEROSOL INHALER: 30 days supply | Qty: 10 | Fill #8 | Status: AC

## 2019-11-10 MED FILL — AMIKACIN 500 MG/2 ML INJECTION SOLUTION: 30 days supply | Qty: 120 | Fill #6

## 2019-11-10 MED FILL — TACROLIMUS 5 MG CAPSULE, IMMEDIATE-RELEASE: ORAL | 30 days supply | Qty: 60 | Fill #7

## 2019-11-10 MED FILL — ALBUTEROL SULFATE HFA 90 MCG/ACTUATION AEROSOL INHALER: 25 days supply | Qty: 8 | Fill #0 | Status: AC

## 2019-11-10 MED FILL — AMIKACIN 500 MG/2 ML INJECTION SOLUTION: 30 days supply | Qty: 120 | Fill #6 | Status: AC

## 2019-11-19 MED FILL — MYCOPHENOLATE MOFETIL 500 MG TABLET: 30 days supply | Qty: 60 | Fill #6 | Status: AC

## 2019-11-19 MED FILL — AZITHROMYCIN 250 MG TABLET: ORAL | 90 days supply | Qty: 90 | Fill #1

## 2019-11-19 MED FILL — MYCOPHENOLATE MOFETIL 500 MG TABLET: ORAL | 30 days supply | Qty: 60 | Fill #6

## 2019-11-19 MED FILL — AZITHROMYCIN 250 MG TABLET: 90 days supply | Qty: 90 | Fill #1 | Status: AC

## 2019-11-26 DIAGNOSIS — Z942 Lung transplant status: Principal | ICD-10-CM

## 2019-11-26 DIAGNOSIS — M109 Gout, unspecified: Principal | ICD-10-CM

## 2019-11-26 DIAGNOSIS — R7303 Prediabetes: Principal | ICD-10-CM

## 2019-11-27 ENCOUNTER — Encounter: Admit: 2019-11-27 | Discharge: 2019-11-28 | Payer: PRIVATE HEALTH INSURANCE | Attending: Family | Primary: Family

## 2019-11-27 ENCOUNTER — Encounter: Admit: 2019-11-27 | Discharge: 2019-11-28 | Payer: PRIVATE HEALTH INSURANCE

## 2019-11-27 DIAGNOSIS — Z23 Encounter for immunization: Principal | ICD-10-CM

## 2019-11-27 DIAGNOSIS — Z942 Lung transplant status: Principal | ICD-10-CM

## 2019-11-27 MED ORDER — SYRINGE WITH NEEDLE 3 ML 20 GAUGE X 1 1/2"
11 refills | 0 days | Status: CP
Start: 2019-11-27 — End: ?
  Filled 2020-01-21: qty 90, 90d supply, fill #0

## 2019-11-27 MED FILL — MONTELUKAST 10 MG TABLET: ORAL | 30 days supply | Qty: 30 | Fill #7

## 2019-11-27 MED FILL — MONTELUKAST 10 MG TABLET: 30 days supply | Qty: 30 | Fill #7 | Status: AC

## 2019-12-11 MED FILL — TACROLIMUS 5 MG CAPSULE, IMMEDIATE-RELEASE: ORAL | 30 days supply | Qty: 60 | Fill #8

## 2019-12-11 MED FILL — TACROLIMUS 5 MG CAPSULE, IMMEDIATE-RELEASE: 30 days supply | Qty: 60 | Fill #8 | Status: AC

## 2019-12-15 MED ORDER — AMIKACIN 500 MG/2 ML INJECTION SOLUTION
11 refills | 0 days | Status: CP
Start: 2019-12-15 — End: 2020-12-14
  Filled 2019-12-15: qty 60, 15d supply, fill #0

## 2019-12-15 MED FILL — ALBUTEROL SULFATE 2.5 MG/3 ML (0.083 %) SOLUTION FOR NEBULIZATION: 8 days supply | Qty: 90 | Fill #4 | Status: AC

## 2019-12-15 MED FILL — COLCHICINE 0.6 MG TABLET: 14 days supply | Qty: 14 | Fill #1 | Status: AC

## 2019-12-15 MED FILL — FUROSEMIDE 20 MG TABLET: ORAL | 90 days supply | Qty: 90 | Fill #2

## 2019-12-15 MED FILL — SYMBICORT 160 MCG-4.5 MCG/ACTUATION HFA AEROSOL INHALER: RESPIRATORY_TRACT | 30 days supply | Qty: 10.2 | Fill #9

## 2019-12-15 MED FILL — SYMBICORT 160 MCG-4.5 MCG/ACTUATION HFA AEROSOL INHALER: 30 days supply | Qty: 10 | Fill #9 | Status: AC

## 2019-12-15 MED FILL — ALBUTEROL SULFATE 2.5 MG/3 ML (0.083 %) SOLUTION FOR NEBULIZATION: RESPIRATORY_TRACT | 8 days supply | Qty: 90 | Fill #4

## 2019-12-15 MED FILL — AMIKACIN 500 MG/2 ML INJECTION SOLUTION: 15 days supply | Qty: 60 | Fill #0 | Status: AC

## 2019-12-15 MED FILL — TACROLIMUS 1 MG CAPSULE, IMMEDIATE-RELEASE: 30 days supply | Qty: 120 | Fill #5 | Status: AC

## 2019-12-15 MED FILL — TACROLIMUS 1 MG CAPSULE, IMMEDIATE-RELEASE: ORAL | 30 days supply | Qty: 120 | Fill #5

## 2019-12-15 MED FILL — COLCHICINE 0.6 MG TABLET: ORAL | 14 days supply | Qty: 14 | Fill #1

## 2019-12-15 MED FILL — CHOLECALCIFEROL (VITAMIN D3) 25 MCG (1,000 UNIT) TABLET: ORAL | 100 days supply | Qty: 100 | Fill #2

## 2019-12-15 MED FILL — FUROSEMIDE 20 MG TABLET: 90 days supply | Qty: 90 | Fill #2 | Status: AC

## 2019-12-15 MED FILL — PANTOPRAZOLE 40 MG TABLET,DELAYED RELEASE: 90 days supply | Qty: 180 | Fill #2 | Status: AC

## 2019-12-15 MED FILL — ALBUTEROL SULFATE HFA 90 MCG/ACTUATION AEROSOL INHALER: 25 days supply | Qty: 8 | Fill #1 | Status: AC

## 2019-12-15 MED FILL — PANTOPRAZOLE 40 MG TABLET,DELAYED RELEASE: ORAL | 90 days supply | Qty: 180 | Fill #2

## 2019-12-15 MED FILL — CHOLECALCIFEROL (VITAMIN D3) 25 MCG (1,000 UNIT) TABLET: 100 days supply | Qty: 100 | Fill #2 | Status: AC

## 2019-12-15 MED FILL — ALBUTEROL SULFATE HFA 90 MCG/ACTUATION AEROSOL INHALER: 25 days supply | Qty: 8.5 | Fill #1

## 2019-12-25 MED FILL — PREDNISONE 2.5 MG TABLET: ORAL | 90 days supply | Qty: 90 | Fill #1

## 2019-12-25 MED FILL — PREDNISONE 5 MG TABLET: 90 days supply | Qty: 90 | Fill #2

## 2019-12-25 MED FILL — PREDNISONE 5 MG TABLET: 90 days supply | Qty: 90 | Fill #2 | Status: AC

## 2019-12-25 MED FILL — PREDNISONE 2.5 MG TABLET: 90 days supply | Qty: 90 | Fill #1 | Status: AC

## 2019-12-28 MED FILL — MONTELUKAST 10 MG TABLET: ORAL | 30 days supply | Qty: 30 | Fill #8

## 2019-12-28 MED FILL — MYCOPHENOLATE MOFETIL 500 MG TABLET: ORAL | 30 days supply | Qty: 60 | Fill #7

## 2019-12-28 MED FILL — MYCOPHENOLATE MOFETIL 500 MG TABLET: 30 days supply | Qty: 60 | Fill #7 | Status: AC

## 2019-12-28 MED FILL — MONTELUKAST 10 MG TABLET: 30 days supply | Qty: 30 | Fill #8 | Status: AC

## 2020-01-06 MED FILL — ALBUTEROL SULFATE 2.5 MG/3 ML (0.083 %) SOLUTION FOR NEBULIZATION: 8 days supply | Qty: 90 | Fill #5 | Status: AC

## 2020-01-06 MED FILL — ALBUTEROL SULFATE HFA 90 MCG/ACTUATION AEROSOL INHALER: 25 days supply | Qty: 8.5 | Fill #2

## 2020-01-06 MED FILL — TACROLIMUS 5 MG CAPSULE, IMMEDIATE-RELEASE: ORAL | 30 days supply | Qty: 60 | Fill #9

## 2020-01-06 MED FILL — TACROLIMUS 5 MG CAPSULE, IMMEDIATE-RELEASE: 30 days supply | Qty: 60 | Fill #9 | Status: AC

## 2020-01-06 MED FILL — ALBUTEROL SULFATE 2.5 MG/3 ML (0.083 %) SOLUTION FOR NEBULIZATION: RESPIRATORY_TRACT | 8 days supply | Qty: 90 | Fill #5

## 2020-01-06 MED FILL — ALBUTEROL SULFATE HFA 90 MCG/ACTUATION AEROSOL INHALER: 25 days supply | Qty: 8 | Fill #2 | Status: AC

## 2020-01-06 MED FILL — VALSARTAN 40 MG TABLET: 30 days supply | Qty: 30 | Fill #1 | Status: AC

## 2020-01-06 MED FILL — VALSARTAN 40 MG TABLET: ORAL | 30 days supply | Qty: 30 | Fill #1

## 2020-01-21 MED FILL — SYMBICORT 160 MCG-4.5 MCG/ACTUATION HFA AEROSOL INHALER: 30 days supply | Qty: 10 | Fill #10 | Status: AC

## 2020-01-21 MED FILL — TACROLIMUS 1 MG CAPSULE, IMMEDIATE-RELEASE: 30 days supply | Qty: 120 | Fill #6 | Status: AC

## 2020-01-21 MED FILL — ATORVASTATIN 20 MG TABLET: 90 days supply | Qty: 90 | Fill #2 | Status: AC

## 2020-01-21 MED FILL — MYCOPHENOLATE MOFETIL 500 MG TABLET: 30 days supply | Qty: 60 | Fill #8 | Status: AC

## 2020-01-21 MED FILL — AMIKACIN 500 MG/2 ML INJECTION SOLUTION: 15 days supply | Qty: 60 | Fill #1 | Status: AC

## 2020-01-21 MED FILL — BD LUER-LOK SYRINGE 3 ML 20 GAUGE X 1 1/2": 90 days supply | Qty: 90 | Fill #0 | Status: AC

## 2020-01-21 MED FILL — SYMBICORT 160 MCG-4.5 MCG/ACTUATION HFA AEROSOL INHALER: RESPIRATORY_TRACT | 30 days supply | Qty: 10.2 | Fill #10

## 2020-01-21 MED FILL — ATORVASTATIN 20 MG TABLET: ORAL | 90 days supply | Qty: 90 | Fill #2

## 2020-01-21 MED FILL — AMIKACIN 500 MG/2 ML INJECTION SOLUTION: 15 days supply | Qty: 60 | Fill #1

## 2020-01-21 MED FILL — TACROLIMUS 1 MG CAPSULE, IMMEDIATE-RELEASE: ORAL | 30 days supply | Qty: 120 | Fill #6

## 2020-01-21 MED FILL — MYCOPHENOLATE MOFETIL 500 MG TABLET: ORAL | 30 days supply | Qty: 60 | Fill #8

## 2020-01-25 DIAGNOSIS — Z942 Lung transplant status: Principal | ICD-10-CM

## 2020-01-27 ENCOUNTER — Encounter: Admit: 2020-01-27 | Discharge: 2020-01-28 | Payer: PRIVATE HEALTH INSURANCE

## 2020-01-27 DIAGNOSIS — M109 Gout, unspecified: Principal | ICD-10-CM

## 2020-01-27 DIAGNOSIS — Z7951 Long term (current) use of inhaled steroids: Principal | ICD-10-CM

## 2020-01-27 DIAGNOSIS — R0683 Snoring: Principal | ICD-10-CM

## 2020-01-27 DIAGNOSIS — M10079 Idiopathic gout, unspecified ankle and foot: Principal | ICD-10-CM

## 2020-01-27 DIAGNOSIS — K219 Gastro-esophageal reflux disease without esophagitis: Principal | ICD-10-CM

## 2020-01-27 DIAGNOSIS — Z4824 Encounter for aftercare following lung transplant: Principal | ICD-10-CM

## 2020-01-27 DIAGNOSIS — R600 Localized edema: Principal | ICD-10-CM

## 2020-01-27 DIAGNOSIS — Z942 Lung transplant status: Principal | ICD-10-CM

## 2020-01-27 DIAGNOSIS — B965 Pseudomonas (aeruginosa) (mallei) (pseudomallei) as the cause of diseases classified elsewhere: Principal | ICD-10-CM

## 2020-01-27 MED ORDER — ALLOPURINOL 100 MG TABLET
ORAL_TABLET | Freq: Every day | ORAL | 3 refills | 90 days | Status: CP
Start: 2020-01-27 — End: 2021-01-26
  Filled 2020-01-27: qty 90, 90d supply, fill #0

## 2020-01-27 MED ORDER — COLCHICINE 0.6 MG TABLET
ORAL_TABLET | Freq: Every day | ORAL | 1 refills | 14.00000 days | Status: CP | PRN
Start: 2020-01-27 — End: 2021-01-26
  Filled 2020-01-27: qty 14, 14d supply, fill #0

## 2020-01-27 MED FILL — COLCHICINE 0.6 MG TABLET: 14 days supply | Qty: 14 | Fill #0 | Status: AC

## 2020-01-27 MED FILL — ALLOPURINOL 100 MG TABLET: 90 days supply | Qty: 90 | Fill #0 | Status: AC

## 2020-01-28 ENCOUNTER — Encounter: Admit: 2020-01-28 | Discharge: 2020-01-28 | Payer: PRIVATE HEALTH INSURANCE

## 2020-01-28 DIAGNOSIS — Z942 Lung transplant status: Principal | ICD-10-CM

## 2020-01-28 DIAGNOSIS — N183 Chronic kidney disease, stage 3 unspecified: Principal | ICD-10-CM

## 2020-01-28 DIAGNOSIS — Z888 Allergy status to other drugs, medicaments and biological substances status: Principal | ICD-10-CM

## 2020-01-28 DIAGNOSIS — Z79899 Other long term (current) drug therapy: Principal | ICD-10-CM

## 2020-01-28 DIAGNOSIS — Z7982 Long term (current) use of aspirin: Principal | ICD-10-CM

## 2020-01-28 DIAGNOSIS — Z880 Allergy status to penicillin: Principal | ICD-10-CM

## 2020-01-28 DIAGNOSIS — Z91041 Radiographic dye allergy status: Principal | ICD-10-CM

## 2020-01-28 DIAGNOSIS — Z7952 Long term (current) use of systemic steroids: Principal | ICD-10-CM

## 2020-01-28 DIAGNOSIS — Z452 Encounter for adjustment and management of vascular access device: Principal | ICD-10-CM

## 2020-01-28 DIAGNOSIS — Z792 Long term (current) use of antibiotics: Principal | ICD-10-CM

## 2020-01-28 DIAGNOSIS — Z91040 Latex allergy status: Principal | ICD-10-CM

## 2020-02-01 DIAGNOSIS — Z942 Lung transplant status: Principal | ICD-10-CM

## 2020-02-08 DIAGNOSIS — Z942 Lung transplant status: Principal | ICD-10-CM

## 2020-02-08 MED FILL — VALGANCICLOVIR 450 MG TABLET: 90 days supply | Qty: 180 | Fill #2 | Status: AC

## 2020-02-08 MED FILL — ALBUTEROL SULFATE HFA 90 MCG/ACTUATION AEROSOL INHALER: 25 days supply | Qty: 8.5 | Fill #3

## 2020-02-08 MED FILL — VALSARTAN 40 MG TABLET: ORAL | 30 days supply | Qty: 30 | Fill #2

## 2020-02-08 MED FILL — ALBUTEROL SULFATE HFA 90 MCG/ACTUATION AEROSOL INHALER: 25 days supply | Qty: 8 | Fill #3 | Status: AC

## 2020-02-08 MED FILL — MONTELUKAST 10 MG TABLET: ORAL | 30 days supply | Qty: 30 | Fill #9

## 2020-02-08 MED FILL — VALSARTAN 40 MG TABLET: 30 days supply | Qty: 30 | Fill #2 | Status: AC

## 2020-02-08 MED FILL — AZITHROMYCIN 250 MG TABLET: 90 days supply | Qty: 90 | Fill #2 | Status: AC

## 2020-02-08 MED FILL — AZITHROMYCIN 250 MG TABLET: ORAL | 90 days supply | Qty: 90 | Fill #2

## 2020-02-08 MED FILL — VALGANCICLOVIR 450 MG TABLET: ORAL | 90 days supply | Qty: 180 | Fill #2

## 2020-02-08 MED FILL — MONTELUKAST 10 MG TABLET: 30 days supply | Qty: 30 | Fill #9 | Status: AC

## 2020-02-18 ENCOUNTER — Encounter
Admit: 2020-02-18 | Discharge: 2020-02-19 | Payer: PRIVATE HEALTH INSURANCE | Attending: Physician Assistant | Primary: Physician Assistant

## 2020-02-18 DIAGNOSIS — Z20822 Contact with and (suspected) exposure to covid-19: Principal | ICD-10-CM

## 2020-02-18 DIAGNOSIS — Z20828 Contact with and (suspected) exposure to other viral communicable diseases: Principal | ICD-10-CM

## 2020-02-18 MED FILL — SYMBICORT 160 MCG-4.5 MCG/ACTUATION HFA AEROSOL INHALER: RESPIRATORY_TRACT | 30 days supply | Qty: 10.2 | Fill #11

## 2020-02-18 MED FILL — TACROLIMUS 5 MG CAPSULE, IMMEDIATE-RELEASE: 30 days supply | Qty: 60 | Fill #10 | Status: AC

## 2020-02-18 MED FILL — TACROLIMUS 1 MG CAPSULE, IMMEDIATE-RELEASE: 30 days supply | Qty: 120 | Fill #7 | Status: AC

## 2020-02-18 MED FILL — TACROLIMUS 5 MG CAPSULE, IMMEDIATE-RELEASE: ORAL | 30 days supply | Qty: 60 | Fill #10

## 2020-02-18 MED FILL — TACROLIMUS 1 MG CAPSULE, IMMEDIATE-RELEASE: ORAL | 30 days supply | Qty: 120 | Fill #7

## 2020-02-18 MED FILL — SYMBICORT 160 MCG-4.5 MCG/ACTUATION HFA AEROSOL INHALER: 30 days supply | Qty: 10 | Fill #11 | Status: AC

## 2020-02-18 MED FILL — MYCOPHENOLATE MOFETIL 500 MG TABLET: ORAL | 30 days supply | Qty: 60 | Fill #9

## 2020-02-18 MED FILL — MYCOPHENOLATE MOFETIL 500 MG TABLET: 30 days supply | Qty: 60 | Fill #9 | Status: AC

## 2020-02-19 ENCOUNTER — Encounter: Admit: 2020-02-19 | Discharge: 2020-02-19 | Payer: PRIVATE HEALTH INSURANCE

## 2020-02-19 ENCOUNTER — Ambulatory Visit: Admit: 2020-02-19 | Discharge: 2020-02-19 | Payer: PRIVATE HEALTH INSURANCE | Attending: Family | Primary: Family

## 2020-02-19 DIAGNOSIS — R600 Localized edema: Principal | ICD-10-CM

## 2020-02-19 DIAGNOSIS — Z7951 Long term (current) use of inhaled steroids: Principal | ICD-10-CM

## 2020-02-19 DIAGNOSIS — Z880 Allergy status to penicillin: Principal | ICD-10-CM

## 2020-02-19 DIAGNOSIS — K219 Gastro-esophageal reflux disease without esophagitis: Principal | ICD-10-CM

## 2020-02-19 DIAGNOSIS — Z942 Lung transplant status: Principal | ICD-10-CM

## 2020-02-19 DIAGNOSIS — Z888 Allergy status to other drugs, medicaments and biological substances status: Principal | ICD-10-CM

## 2020-02-19 DIAGNOSIS — Z91041 Radiographic dye allergy status: Principal | ICD-10-CM

## 2020-02-19 DIAGNOSIS — R0683 Snoring: Principal | ICD-10-CM

## 2020-02-19 DIAGNOSIS — Z91040 Latex allergy status: Principal | ICD-10-CM

## 2020-03-03 DIAGNOSIS — Z942 Lung transplant status: Principal | ICD-10-CM

## 2020-03-03 DIAGNOSIS — D801 Nonfamilial hypogammaglobulinemia: Principal | ICD-10-CM

## 2020-03-09 MED ORDER — FUROSEMIDE 20 MG TABLET
ORAL_TABLET | Freq: Every day | ORAL | 3 refills | 90 days | Status: CP
Start: 2020-03-09 — End: 2021-03-09
  Filled 2020-03-09: qty 90, 90d supply, fill #0

## 2020-03-09 MED ORDER — MONTELUKAST 10 MG TABLET
ORAL_TABLET | Freq: Every evening | ORAL | 11 refills | 30 days | Status: CP
Start: 2020-03-09 — End: 2021-03-09
  Filled 2020-03-09: qty 30, 30d supply, fill #0

## 2020-03-09 MED FILL — VALSARTAN 40 MG TABLET: ORAL | 30 days supply | Qty: 30 | Fill #3

## 2020-03-09 MED FILL — ALBUTEROL SULFATE HFA 90 MCG/ACTUATION AEROSOL INHALER: 25 days supply | Qty: 8.5 | Fill #4

## 2020-03-09 MED FILL — ALBUTEROL SULFATE 2.5 MG/3 ML (0.083 %) SOLUTION FOR NEBULIZATION: RESPIRATORY_TRACT | 8 days supply | Qty: 90 | Fill #6

## 2020-03-09 MED FILL — PANTOPRAZOLE 40 MG TABLET,DELAYED RELEASE: ORAL | 90 days supply | Qty: 180 | Fill #3

## 2020-03-14 ENCOUNTER — Encounter: Admit: 2020-03-14 | Discharge: 2020-03-14 | Payer: PRIVATE HEALTH INSURANCE

## 2020-03-14 DIAGNOSIS — Z7982 Long term (current) use of aspirin: Principal | ICD-10-CM

## 2020-03-14 DIAGNOSIS — D801 Nonfamilial hypogammaglobulinemia: Principal | ICD-10-CM

## 2020-03-14 DIAGNOSIS — Z79899 Other long term (current) drug therapy: Principal | ICD-10-CM

## 2020-03-14 DIAGNOSIS — J339 Nasal polyp, unspecified: Principal | ICD-10-CM

## 2020-03-14 DIAGNOSIS — Z942 Lung transplant status: Principal | ICD-10-CM

## 2020-03-14 DIAGNOSIS — N183 Chronic kidney disease, stage 3 unspecified: Principal | ICD-10-CM

## 2020-03-14 DIAGNOSIS — Z7952 Long term (current) use of systemic steroids: Principal | ICD-10-CM

## 2020-03-14 DIAGNOSIS — Q348 Other specified congenital malformations of respiratory system: Principal | ICD-10-CM

## 2020-03-14 DIAGNOSIS — J329 Chronic sinusitis, unspecified: Principal | ICD-10-CM

## 2020-03-14 DIAGNOSIS — J310 Chronic rhinitis: Principal | ICD-10-CM

## 2020-03-14 MED FILL — PREDNISONE 2.5 MG TABLET: ORAL | 90 days supply | Qty: 90 | Fill #2

## 2020-03-14 MED FILL — TACROLIMUS 5 MG CAPSULE, IMMEDIATE-RELEASE: ORAL | 30 days supply | Qty: 60 | Fill #11

## 2020-03-23 ENCOUNTER — Encounter
Admit: 2020-03-23 | Discharge: 2020-03-23 | Payer: PRIVATE HEALTH INSURANCE | Attending: Geriatric Medicine | Primary: Geriatric Medicine

## 2020-03-23 ENCOUNTER — Encounter: Admit: 2020-03-23 | Discharge: 2020-03-23 | Payer: PRIVATE HEALTH INSURANCE

## 2020-03-23 DIAGNOSIS — M7022 Olecranon bursitis, left elbow: Principal | ICD-10-CM

## 2020-03-23 DIAGNOSIS — N189 Chronic kidney disease, unspecified: Principal | ICD-10-CM

## 2020-03-23 DIAGNOSIS — Z942 Lung transplant status: Principal | ICD-10-CM

## 2020-03-23 DIAGNOSIS — R600 Localized edema: Principal | ICD-10-CM

## 2020-03-23 DIAGNOSIS — K219 Gastro-esophageal reflux disease without esophagitis: Principal | ICD-10-CM

## 2020-03-23 DIAGNOSIS — M791 Myalgia, unspecified site: Principal | ICD-10-CM

## 2020-03-23 DIAGNOSIS — B9789 Other viral agents as the cause of diseases classified elsewhere: Principal | ICD-10-CM

## 2020-03-23 DIAGNOSIS — J324 Chronic pansinusitis: Principal | ICD-10-CM

## 2020-03-23 DIAGNOSIS — B965 Pseudomonas (aeruginosa) (mallei) (pseudomallei) as the cause of diseases classified elsewhere: Principal | ICD-10-CM

## 2020-03-23 DIAGNOSIS — R0683 Snoring: Principal | ICD-10-CM

## 2020-03-23 DIAGNOSIS — L509 Urticaria, unspecified: Principal | ICD-10-CM

## 2020-03-23 DIAGNOSIS — J988 Other specified respiratory disorders: Principal | ICD-10-CM

## 2020-03-23 MED ORDER — BUDESONIDE-FORMOTEROL HFA 160 MCG-4.5 MCG/ACTUATION AEROSOL INHALER
Freq: Two times a day (BID) | RESPIRATORY_TRACT | 11 refills | 15 days | Status: CP
Start: 2020-03-23 — End: 2021-03-23

## 2020-03-23 MED ORDER — SULFAMETHOXAZOLE 800 MG-TRIMETHOPRIM 160 MG TABLET
ORAL_TABLET | Freq: Two times a day (BID) | ORAL | 0 refills | 14.00000 days | Status: CP
Start: 2020-03-23 — End: 2020-03-23
  Filled 2020-03-23: qty 56, 14d supply, fill #0

## 2020-03-23 MED ORDER — AMOXICILLIN 875 MG-POTASSIUM CLAVULANATE 125 MG TABLET
ORAL_TABLET | Freq: Two times a day (BID) | ORAL | 0 refills | 14.00000 days | Status: CP
Start: 2020-03-23 — End: 2020-04-05
  Filled 2020-03-23: qty 28, 14d supply, fill #0

## 2020-03-23 MED FILL — TACROLIMUS 1 MG CAPSULE, IMMEDIATE-RELEASE: ORAL | 30 days supply | Qty: 120 | Fill #8

## 2020-03-23 MED FILL — CHOLECALCIFEROL (VITAMIN D3) 25 MCG (1,000 UNIT) TABLET: ORAL | 100 days supply | Qty: 100 | Fill #3

## 2020-03-23 MED FILL — PREDNISONE 5 MG TABLET: 90 days supply | Qty: 90 | Fill #3

## 2020-03-25 DIAGNOSIS — Z942 Lung transplant status: Principal | ICD-10-CM

## 2020-03-25 MED ORDER — MYCOPHENOLATE MOFETIL 500 MG TABLET
ORAL_TABLET | Freq: Two times a day (BID) | ORAL | 3 refills | 90 days | Status: CP
Start: 2020-03-25 — End: 2020-06-23
  Filled 2020-03-26: qty 60, 30d supply, fill #0

## 2020-03-26 ENCOUNTER — Encounter: Admit: 2020-03-26 | Discharge: 2020-03-27 | Payer: PRIVATE HEALTH INSURANCE

## 2020-03-26 DIAGNOSIS — Z942 Lung transplant status: Principal | ICD-10-CM

## 2020-03-26 MED FILL — SYMBICORT 160 MCG-4.5 MCG/ACTUATION HFA AEROSOL INHALER: RESPIRATORY_TRACT | 90 days supply | Qty: 30.6 | Fill #0

## 2020-03-29 ENCOUNTER — Encounter: Admit: 2020-03-29 | Discharge: 2020-03-30 | Payer: PRIVATE HEALTH INSURANCE

## 2020-03-29 DIAGNOSIS — Z942 Lung transplant status: Principal | ICD-10-CM

## 2020-03-30 DIAGNOSIS — Z942 Lung transplant status: Principal | ICD-10-CM

## 2020-03-30 MED ORDER — DOXYCYCLINE HYCLATE 100 MG TABLET
ORAL_TABLET | Freq: Two times a day (BID) | ORAL | 0 refills | 5 days | Status: CP
Start: 2020-03-30 — End: 2020-04-04
  Filled 2020-03-30: qty 10, 5d supply, fill #0

## 2020-04-02 ENCOUNTER — Encounter: Admit: 2020-04-02 | Discharge: 2020-04-03 | Payer: PRIVATE HEALTH INSURANCE

## 2020-04-02 DIAGNOSIS — Z942 Lung transplant status: Principal | ICD-10-CM

## 2020-04-05 DIAGNOSIS — M7022 Olecranon bursitis, left elbow: Principal | ICD-10-CM

## 2020-04-05 DIAGNOSIS — Z942 Lung transplant status: Principal | ICD-10-CM

## 2020-04-05 MED ORDER — AMOXICILLIN 875 MG-POTASSIUM CLAVULANATE 125 MG TABLET
ORAL_TABLET | Freq: Two times a day (BID) | ORAL | 0 refills | 7 days | Status: CP
Start: 2020-04-05 — End: 2020-04-12
  Filled 2020-04-06: qty 14, 7d supply, fill #0

## 2020-04-05 MED ORDER — DOXYCYCLINE HYCLATE 100 MG TABLET
ORAL_TABLET | Freq: Two times a day (BID) | ORAL | 0 refills | 7 days | Status: CP
Start: 2020-04-05 — End: 2020-04-12
  Filled 2020-04-06: qty 14, 7d supply, fill #0

## 2020-04-06 MED ORDER — PANTOPRAZOLE 40 MG TABLET,DELAYED RELEASE
ORAL_TABLET | Freq: Two times a day (BID) | ORAL | 3 refills | 90 days | Status: CP
Start: 2020-04-06 — End: 2021-04-06
  Filled 2020-06-23: qty 180, 90d supply, fill #0

## 2020-04-06 MED FILL — MONTELUKAST 10 MG TABLET: ORAL | 90 days supply | Qty: 90 | Fill #1

## 2020-04-06 MED FILL — ALBUTEROL SULFATE HFA 90 MCG/ACTUATION AEROSOL INHALER: 25 days supply | Qty: 8.5 | Fill #5

## 2020-04-06 MED FILL — VALSARTAN 40 MG TABLET: ORAL | 90 days supply | Qty: 90 | Fill #4

## 2020-04-06 MED FILL — ALBUTEROL SULFATE 2.5 MG/3 ML (0.083 %) SOLUTION FOR NEBULIZATION: RESPIRATORY_TRACT | 8 days supply | Qty: 90 | Fill #7

## 2020-04-09 ENCOUNTER — Encounter: Admit: 2020-04-09 | Discharge: 2020-04-10 | Payer: PRIVATE HEALTH INSURANCE

## 2020-04-11 MED ORDER — TACROLIMUS 1 MG CAPSULE, IMMEDIATE-RELEASE
ORAL_CAPSULE | ORAL | 3 refills | 90 days | Status: CP
Start: 2020-04-11 — End: 2021-04-11

## 2020-04-11 MED ORDER — TACROLIMUS 5 MG CAPSULE, IMMEDIATE-RELEASE
ORAL_CAPSULE | Freq: Two times a day (BID) | ORAL | 3 refills | 90 days | Status: CP
Start: 2020-04-11 — End: ?
  Filled 2020-04-20: qty 90, 30d supply, fill #0

## 2020-04-13 ENCOUNTER — Encounter
Admit: 2020-04-13 | Discharge: 2020-04-14 | Payer: PRIVATE HEALTH INSURANCE | Attending: Physician Assistant | Primary: Physician Assistant

## 2020-04-20 MED FILL — TACROLIMUS 5 MG CAPSULE, IMMEDIATE-RELEASE: ORAL | 30 days supply | Qty: 60 | Fill #0

## 2020-04-20 MED FILL — ATORVASTATIN 20 MG TABLET: ORAL | 90 days supply | Qty: 90 | Fill #3

## 2020-04-20 MED FILL — BD LUER-LOK SYRINGE 3 ML 20 GAUGE X 1 1/2": 90 days supply | Qty: 90 | Fill #1

## 2020-04-27 MED FILL — MYCOPHENOLATE MOFETIL 500 MG TABLET: ORAL | 30 days supply | Qty: 60 | Fill #1

## 2020-05-05 DIAGNOSIS — B259 Cytomegaloviral disease, unspecified: Principal | ICD-10-CM

## 2020-05-05 MED ORDER — VALGANCICLOVIR 450 MG TABLET
ORAL_TABLET | Freq: Every day | ORAL | 3 refills | 90 days | Status: CP
Start: 2020-05-05 — End: 2020-08-03
  Filled 2020-05-05: qty 180, 90d supply, fill #0

## 2020-05-05 MED FILL — ALLOPURINOL 100 MG TABLET: ORAL | 90 days supply | Qty: 90 | Fill #1

## 2020-05-05 MED FILL — ALBUTEROL SULFATE HFA 90 MCG/ACTUATION AEROSOL INHALER: 25 days supply | Qty: 8.5 | Fill #6

## 2020-05-07 ENCOUNTER — Encounter: Admit: 2020-05-07 | Discharge: 2020-05-08 | Payer: PRIVATE HEALTH INSURANCE

## 2020-05-20 MED FILL — TACROLIMUS 5 MG CAPSULE, IMMEDIATE-RELEASE: ORAL | 30 days supply | Qty: 60 | Fill #1

## 2020-05-20 MED FILL — AZITHROMYCIN 250 MG TABLET: ORAL | 90 days supply | Qty: 90 | Fill #3

## 2020-05-26 MED FILL — MYCOPHENOLATE MOFETIL 500 MG TABLET: ORAL | 30 days supply | Qty: 60 | Fill #2

## 2020-05-26 MED FILL — ALBUTEROL SULFATE HFA 90 MCG/ACTUATION AEROSOL INHALER: 25 days supply | Qty: 8.5 | Fill #7

## 2020-05-26 MED FILL — TACROLIMUS 1 MG CAPSULE, IMMEDIATE-RELEASE: ORAL | 30 days supply | Qty: 90 | Fill #1

## 2020-06-11 ENCOUNTER — Ambulatory Visit: Admit: 2020-06-11 | Discharge: 2020-06-12 | Payer: PRIVATE HEALTH INSURANCE

## 2020-06-13 DIAGNOSIS — Z942 Lung transplant status: Principal | ICD-10-CM

## 2020-06-17 MED ORDER — PREDNISONE 5 MG TABLET
ORAL_TABLET | 3 refills | 0 days | Status: CP
Start: 2020-06-17 — End: 2021-06-17
  Filled 2020-06-17: qty 90, 90d supply, fill #0

## 2020-06-17 MED FILL — PREDNISONE 2.5 MG TABLET: ORAL | 90 days supply | Qty: 90 | Fill #3

## 2020-06-17 MED FILL — TACROLIMUS 5 MG CAPSULE, IMMEDIATE-RELEASE: ORAL | 30 days supply | Qty: 60 | Fill #2

## 2020-06-23 MED FILL — MYCOPHENOLATE MOFETIL 500 MG TABLET: ORAL | 30 days supply | Qty: 60 | Fill #3

## 2020-06-23 MED FILL — SYMBICORT 160 MCG-4.5 MCG/ACTUATION HFA AEROSOL INHALER: RESPIRATORY_TRACT | 90 days supply | Qty: 30.6 | Fill #1

## 2020-06-23 MED FILL — TACROLIMUS 1 MG CAPSULE, IMMEDIATE-RELEASE: ORAL | 30 days supply | Qty: 90 | Fill #2

## 2020-06-23 MED FILL — AMIKACIN 500 MG/2 ML INJECTION SOLUTION: 15 days supply | Qty: 60 | Fill #2

## 2020-06-23 MED FILL — FUROSEMIDE 20 MG TABLET: ORAL | 90 days supply | Qty: 90 | Fill #1

## 2020-06-23 MED FILL — ALBUTEROL SULFATE HFA 90 MCG/ACTUATION AEROSOL INHALER: 25 days supply | Qty: 8.5 | Fill #8

## 2020-07-02 ENCOUNTER — Encounter: Admit: 2020-07-02 | Discharge: 2020-07-03 | Payer: PRIVATE HEALTH INSURANCE

## 2020-07-06 MED FILL — VALSARTAN 40 MG TABLET: ORAL | 90 days supply | Qty: 90 | Fill #5

## 2020-07-06 MED FILL — MONTELUKAST 10 MG TABLET: ORAL | 90 days supply | Qty: 90 | Fill #2

## 2020-07-20 MED ORDER — ATORVASTATIN 20 MG TABLET
ORAL_TABLET | Freq: Every day | ORAL | 11 refills | 30.00000 days | Status: CP
Start: 2020-07-20 — End: 2021-07-20
  Filled 2020-07-20: qty 30, 30d supply, fill #0

## 2020-07-20 MED FILL — TACROLIMUS 1 MG CAPSULE, IMMEDIATE-RELEASE: ORAL | 30 days supply | Qty: 90 | Fill #3

## 2020-07-20 MED FILL — TACROLIMUS 5 MG CAPSULE, IMMEDIATE-RELEASE: ORAL | 30 days supply | Qty: 60 | Fill #3

## 2020-07-20 MED FILL — BD LUER-LOK SYRINGE 3 ML 20 GAUGE X 1 1/2": 90 days supply | Qty: 90 | Fill #2

## 2020-07-20 MED FILL — AMIKACIN 500 MG/2 ML INJECTION SOLUTION: 15 days supply | Qty: 60 | Fill #3

## 2020-07-20 MED FILL — ALBUTEROL SULFATE HFA 90 MCG/ACTUATION AEROSOL INHALER: 25 days supply | Qty: 8.5 | Fill #9

## 2020-07-20 MED FILL — MYCOPHENOLATE MOFETIL 500 MG TABLET: ORAL | 30 days supply | Qty: 60 | Fill #4

## 2020-08-05 MED FILL — VALGANCICLOVIR 450 MG TABLET: ORAL | 90 days supply | Qty: 180 | Fill #1

## 2020-08-05 MED FILL — ALLOPURINOL 100 MG TABLET: ORAL | 90 days supply | Qty: 90 | Fill #2

## 2020-08-05 MED FILL — AMIKACIN 500 MG/2 ML INJECTION SOLUTION: 15 days supply | Qty: 60 | Fill #4

## 2020-08-16 DIAGNOSIS — Z942 Lung transplant status: Principal | ICD-10-CM

## 2020-08-17 DIAGNOSIS — Z942 Lung transplant status: Principal | ICD-10-CM

## 2020-08-17 MED ORDER — PREDNISONE 2.5 MG TABLET
ORAL_TABLET | Freq: Every day | ORAL | 3 refills | 90.00000 days | Status: CP
Start: 2020-08-17 — End: 2020-08-17
  Filled 2020-08-24: qty 90, 90d supply, fill #0

## 2020-08-17 MED ORDER — AZITHROMYCIN 250 MG TABLET
ORAL_TABLET | Freq: Every day | ORAL | 3 refills | 90.00000 days | Status: CP
Start: 2020-08-17 — End: 2020-08-17
  Filled 2020-08-17: qty 90, 90d supply, fill #0

## 2020-08-17 MED ORDER — ALBUTEROL SULFATE 2.5 MG/3 ML (0.083 %) SOLUTION FOR NEBULIZATION
RESPIRATORY_TRACT | 11 refills | 5 days | Status: CP | PRN
Start: 2020-08-17 — End: 2021-08-17

## 2020-08-17 MED FILL — TACROLIMUS 1 MG CAPSULE, IMMEDIATE-RELEASE: ORAL | 30 days supply | Qty: 90 | Fill #4

## 2020-08-17 MED FILL — ALBUTEROL SULFATE HFA 90 MCG/ACTUATION AEROSOL INHALER: 25 days supply | Qty: 8.5 | Fill #10

## 2020-08-17 MED FILL — ATORVASTATIN 20 MG TABLET: ORAL | 30 days supply | Qty: 30 | Fill #1

## 2020-08-17 MED FILL — AMIKACIN 500 MG/2 ML INJECTION SOLUTION: 15 days supply | Qty: 60 | Fill #5

## 2020-08-17 MED FILL — MYCOPHENOLATE MOFETIL 500 MG TABLET: ORAL | 30 days supply | Qty: 60 | Fill #5

## 2020-08-17 MED FILL — TACROLIMUS 5 MG CAPSULE, IMMEDIATE-RELEASE: ORAL | 30 days supply | Qty: 60 | Fill #4

## 2020-08-18 ENCOUNTER — Encounter: Admit: 2020-08-18 | Discharge: 2020-08-19 | Payer: PRIVATE HEALTH INSURANCE

## 2020-08-18 DIAGNOSIS — J310 Chronic rhinitis: Principal | ICD-10-CM

## 2020-08-18 DIAGNOSIS — J329 Chronic sinusitis, unspecified: Principal | ICD-10-CM

## 2020-08-22 DIAGNOSIS — Z942 Lung transplant status: Principal | ICD-10-CM

## 2020-08-24 ENCOUNTER — Encounter: Admit: 2020-08-24 | Discharge: 2020-08-25 | Payer: PRIVATE HEALTH INSURANCE

## 2020-08-24 ENCOUNTER — Encounter: Admit: 2020-08-24 | Discharge: 2020-08-25 | Payer: PRIVATE HEALTH INSURANCE | Attending: Family | Primary: Family

## 2020-08-24 ENCOUNTER — Ambulatory Visit: Admit: 2020-08-24 | Discharge: 2020-08-25 | Payer: PRIVATE HEALTH INSURANCE

## 2020-08-24 DIAGNOSIS — M8580 Other specified disorders of bone density and structure, unspecified site: Principal | ICD-10-CM

## 2020-08-24 DIAGNOSIS — D801 Nonfamilial hypogammaglobulinemia: Principal | ICD-10-CM

## 2020-08-24 DIAGNOSIS — Z942 Lung transplant status: Principal | ICD-10-CM

## 2020-08-24 MED ORDER — TACROLIMUS 1 MG CAPSULE, IMMEDIATE-RELEASE
ORAL_CAPSULE | Freq: Two times a day (BID) | ORAL | 3 refills | 90 days | Status: CP
Start: 2020-08-24 — End: 2021-08-24

## 2020-08-24 MED ORDER — ALBUTEROL SULFATE 2.5 MG/3 ML (0.083 %) SOLUTION FOR NEBULIZATION
RESPIRATORY_TRACT | 3 refills | 15 days | Status: CP | PRN
Start: 2020-08-24 — End: 2021-08-24
  Filled 2020-08-24: qty 90, 8d supply, fill #0

## 2020-08-24 MED ORDER — TACROLIMUS 5 MG CAPSULE, IMMEDIATE-RELEASE
ORAL_CAPSULE | Freq: Two times a day (BID) | ORAL | 3 refills | 90.00000 days | Status: CP
Start: 2020-08-24 — End: ?
  Filled 2020-09-19: qty 60, 30d supply, fill #0

## 2020-08-25 DIAGNOSIS — Z942 Lung transplant status: Principal | ICD-10-CM

## 2020-08-25 MED ORDER — PEG 3350-ELECTROLYTES 236 GRAM-22.74 GRAM-6.74 GRAM-5.86 GRAM SOLUTION
ORAL | 0 refills | 0.00000 days | Status: CP
Start: 2020-08-25 — End: ?

## 2020-09-01 ENCOUNTER — Encounter: Admit: 2020-09-01 | Discharge: 2020-09-02 | Payer: PRIVATE HEALTH INSURANCE

## 2020-09-01 DIAGNOSIS — Z942 Lung transplant status: Principal | ICD-10-CM

## 2020-09-01 DIAGNOSIS — J329 Chronic sinusitis, unspecified: Principal | ICD-10-CM

## 2020-09-01 DIAGNOSIS — J310 Chronic rhinitis: Principal | ICD-10-CM

## 2020-09-19 MED FILL — ATORVASTATIN 20 MG TABLET: ORAL | 30 days supply | Qty: 30 | Fill #2

## 2020-09-19 MED FILL — COLCHICINE 0.6 MG TABLET: ORAL | 14 days supply | Qty: 14 | Fill #1

## 2020-09-19 MED FILL — PREDNISONE 5 MG TABLET: 90 days supply | Qty: 90 | Fill #1

## 2020-09-19 MED FILL — MYCOPHENOLATE MOFETIL 500 MG TABLET: ORAL | 30 days supply | Qty: 60 | Fill #6

## 2020-09-26 MED FILL — ALBUTEROL SULFATE HFA 90 MCG/ACTUATION AEROSOL INHALER: 25 days supply | Qty: 8.5 | Fill #11

## 2020-09-26 MED FILL — PANTOPRAZOLE 40 MG TABLET,DELAYED RELEASE: ORAL | 90 days supply | Qty: 180 | Fill #1

## 2020-09-26 MED FILL — FUROSEMIDE 20 MG TABLET: ORAL | 90 days supply | Qty: 90 | Fill #2

## 2020-10-03 MED ORDER — VALSARTAN 40 MG TABLET
ORAL_TABLET | Freq: Every day | ORAL | 3 refills | 90.00000 days | Status: CP
Start: 2020-10-03 — End: 2021-10-03
  Filled 2020-10-03: qty 30, 30d supply, fill #0

## 2020-10-03 MED FILL — SYMBICORT 160 MCG-4.5 MCG/ACTUATION HFA AEROSOL INHALER: RESPIRATORY_TRACT | 90 days supply | Qty: 30.6 | Fill #2

## 2020-10-03 MED FILL — ALBUTEROL SULFATE 2.5 MG/3 ML (0.083 %) SOLUTION FOR NEBULIZATION: RESPIRATORY_TRACT | 23 days supply | Qty: 270 | Fill #0

## 2020-10-03 MED FILL — TACROLIMUS 1 MG CAPSULE, IMMEDIATE-RELEASE: ORAL | 30 days supply | Qty: 60 | Fill #0

## 2020-10-03 MED FILL — MONTELUKAST 10 MG TABLET: ORAL | 90 days supply | Qty: 90 | Fill #3

## 2020-10-12 ENCOUNTER — Ambulatory Visit
Admit: 2020-10-12 | Discharge: 2020-10-13 | Payer: PRIVATE HEALTH INSURANCE | Attending: Student in an Organized Health Care Education/Training Program | Primary: Student in an Organized Health Care Education/Training Program

## 2020-10-12 DIAGNOSIS — L821 Other seborrheic keratosis: Principal | ICD-10-CM

## 2020-10-12 DIAGNOSIS — L814 Other melanin hyperpigmentation: Principal | ICD-10-CM

## 2020-10-12 DIAGNOSIS — D1801 Hemangioma of skin and subcutaneous tissue: Principal | ICD-10-CM

## 2020-10-12 DIAGNOSIS — Z942 Lung transplant status: Principal | ICD-10-CM

## 2020-10-12 DIAGNOSIS — D229 Melanocytic nevi, unspecified: Principal | ICD-10-CM

## 2020-10-12 DIAGNOSIS — Q828 Other specified congenital malformations of skin: Principal | ICD-10-CM

## 2020-10-13 MED ORDER — ALBUTEROL SULFATE HFA 90 MCG/ACTUATION AEROSOL INHALER
11 refills | 0 days | Status: CP
Start: 2020-10-13 — End: 2021-10-13
  Filled 2020-10-19: qty 8.5, 17d supply, fill #0

## 2020-10-19 MED FILL — AMIKACIN 500 MG/2 ML INJECTION SOLUTION: 15 days supply | Qty: 60 | Fill #6

## 2020-10-19 MED FILL — TACROLIMUS 5 MG CAPSULE, IMMEDIATE-RELEASE: ORAL | 30 days supply | Qty: 60 | Fill #1

## 2020-10-19 MED FILL — ATORVASTATIN 20 MG TABLET: ORAL | 30 days supply | Qty: 30 | Fill #3

## 2020-10-19 MED FILL — MYCOPHENOLATE MOFETIL 500 MG TABLET: ORAL | 30 days supply | Qty: 60 | Fill #7

## 2020-10-28 MED FILL — PEG 3350-ELECTROLYTES 236 GRAM-22.74 GRAM-6.74 GRAM-5.86 GRAM SOLUTION: ORAL | 1 days supply | Qty: 4000 | Fill #0

## 2020-11-03 MED FILL — VALSARTAN 40 MG TABLET: ORAL | 90 days supply | Qty: 90 | Fill #1

## 2020-11-03 MED FILL — ALLOPURINOL 100 MG TABLET: ORAL | 90 days supply | Qty: 90 | Fill #3

## 2020-11-03 MED FILL — TACROLIMUS 1 MG CAPSULE, IMMEDIATE-RELEASE: ORAL | 30 days supply | Qty: 60 | Fill #1

## 2020-11-03 MED FILL — ALBUTEROL SULFATE 2.5 MG/3 ML (0.083 %) SOLUTION FOR NEBULIZATION: RESPIRATORY_TRACT | 23 days supply | Qty: 270 | Fill #1

## 2020-11-14 MED FILL — ATORVASTATIN 20 MG TABLET: ORAL | 30 days supply | Qty: 30 | Fill #4

## 2020-11-14 MED FILL — AZITHROMYCIN 250 MG TABLET: ORAL | 90 days supply | Qty: 90 | Fill #1

## 2020-11-14 MED FILL — ALBUTEROL SULFATE HFA 90 MCG/ACTUATION AEROSOL INHALER: 17 days supply | Qty: 8.5 | Fill #1

## 2020-11-14 MED FILL — MYCOPHENOLATE MOFETIL 500 MG TABLET: ORAL | 30 days supply | Qty: 60 | Fill #8

## 2020-11-14 MED FILL — TACROLIMUS 5 MG CAPSULE, IMMEDIATE-RELEASE: ORAL | 30 days supply | Qty: 60 | Fill #2

## 2020-11-18 ENCOUNTER — Telehealth
Admit: 2020-11-18 | Discharge: 2020-11-19 | Payer: PRIVATE HEALTH INSURANCE | Attending: Registered" | Primary: Registered"

## 2020-11-18 ENCOUNTER — Encounter
Admit: 2020-11-18 | Discharge: 2020-11-18 | Payer: PRIVATE HEALTH INSURANCE | Attending: Anesthesiology | Primary: Anesthesiology

## 2020-11-18 ENCOUNTER — Ambulatory Visit: Admit: 2020-11-18 | Discharge: 2020-11-18 | Payer: PRIVATE HEALTH INSURANCE

## 2020-11-25 ENCOUNTER — Ambulatory Visit: Admit: 2020-11-25 | Discharge: 2020-11-26 | Payer: PRIVATE HEALTH INSURANCE

## 2020-12-07 MED ORDER — SYRINGE 3 ML 21 GAUGE X 1"
0 refills | 0 days
Start: 2020-12-07 — End: ?

## 2020-12-07 MED ORDER — SYRINGE WITH NEEDLE 3 ML 20 GAUGE X 1 1/2"
11 refills | 0 days | Status: CP
Start: 2020-12-07 — End: ?
  Filled 2020-12-07: qty 90, 90d supply, fill #0

## 2020-12-07 MED FILL — ALBUTEROL SULFATE 2.5 MG/3 ML (0.083 %) SOLUTION FOR NEBULIZATION: RESPIRATORY_TRACT | 23 days supply | Qty: 270 | Fill #2

## 2020-12-07 MED FILL — AMIKACIN 500 MG/2 ML INJECTION SOLUTION: 15 days supply | Qty: 60 | Fill #7

## 2020-12-07 MED FILL — TACROLIMUS 1 MG CAPSULE, IMMEDIATE-RELEASE: ORAL | 30 days supply | Qty: 60 | Fill #2

## 2020-12-07 MED FILL — ALBUTEROL SULFATE HFA 90 MCG/ACTUATION AEROSOL INHALER: 17 days supply | Qty: 8.5 | Fill #2

## 2020-12-15 DIAGNOSIS — Z942 Lung transplant status: Principal | ICD-10-CM

## 2020-12-16 MED FILL — MYCOPHENOLATE MOFETIL 500 MG TABLET: ORAL | 30 days supply | Qty: 60 | Fill #9

## 2020-12-16 MED FILL — FUROSEMIDE 20 MG TABLET: ORAL | 90 days supply | Qty: 90 | Fill #3

## 2020-12-16 MED FILL — TACROLIMUS 5 MG CAPSULE, IMMEDIATE-RELEASE: ORAL | 30 days supply | Qty: 60 | Fill #3

## 2020-12-16 MED FILL — PREDNISONE 5 MG TABLET: 90 days supply | Qty: 90 | Fill #2

## 2020-12-16 MED FILL — PREDNISONE 2.5 MG TABLET: ORAL | 90 days supply | Qty: 90 | Fill #1

## 2020-12-16 MED FILL — PANTOPRAZOLE 40 MG TABLET,DELAYED RELEASE: ORAL | 90 days supply | Qty: 180 | Fill #2

## 2020-12-16 MED FILL — ATORVASTATIN 20 MG TABLET: ORAL | 30 days supply | Qty: 30 | Fill #5

## 2020-12-22 DIAGNOSIS — Z942 Lung transplant status: Principal | ICD-10-CM

## 2020-12-23 ENCOUNTER — Ambulatory Visit: Admit: 2020-12-23 | Discharge: 2020-12-24 | Payer: PRIVATE HEALTH INSURANCE

## 2020-12-23 ENCOUNTER — Encounter: Admit: 2020-12-23 | Discharge: 2020-12-24 | Payer: PRIVATE HEALTH INSURANCE | Attending: Family | Primary: Family

## 2020-12-23 DIAGNOSIS — Z942 Lung transplant status: Principal | ICD-10-CM

## 2020-12-23 DIAGNOSIS — J310 Chronic rhinitis: Principal | ICD-10-CM

## 2020-12-23 MED ORDER — OSELTAMIVIR 75 MG CAPSULE
ORAL_CAPSULE | Freq: Two times a day (BID) | ORAL | 0 refills | 10 days | Status: CP
Start: 2020-12-23 — End: 2021-01-02

## 2020-12-27 ENCOUNTER — Ambulatory Visit
Admit: 2020-12-27 | Discharge: 2020-12-28 | Payer: PRIVATE HEALTH INSURANCE | Attending: Student in an Organized Health Care Education/Training Program | Primary: Student in an Organized Health Care Education/Training Program

## 2020-12-27 DIAGNOSIS — H6593 Unspecified nonsuppurative otitis media, bilateral: Principal | ICD-10-CM

## 2020-12-27 DIAGNOSIS — H9190 Unspecified hearing loss, unspecified ear: Principal | ICD-10-CM

## 2021-01-03 MED FILL — SYMBICORT 160 MCG-4.5 MCG/ACTUATION HFA AEROSOL INHALER: RESPIRATORY_TRACT | 90 days supply | Qty: 30.6 | Fill #3

## 2021-01-03 MED FILL — MONTELUKAST 10 MG TABLET: ORAL | 30 days supply | Qty: 30 | Fill #4

## 2021-01-03 MED FILL — TACROLIMUS 1 MG CAPSULE, IMMEDIATE-RELEASE: ORAL | 30 days supply | Qty: 60 | Fill #3

## 2021-01-03 MED FILL — ALBUTEROL SULFATE HFA 90 MCG/ACTUATION AEROSOL INHALER: 17 days supply | Qty: 8.5 | Fill #3

## 2021-01-12 ENCOUNTER — Ambulatory Visit: Admit: 2021-01-12 | Discharge: 2021-01-13 | Payer: PRIVATE HEALTH INSURANCE

## 2021-01-12 ENCOUNTER — Encounter: Admit: 2021-01-12 | Discharge: 2021-01-13 | Payer: PRIVATE HEALTH INSURANCE | Attending: Family | Primary: Family

## 2021-01-12 DIAGNOSIS — Z942 Lung transplant status: Principal | ICD-10-CM

## 2021-01-12 MED ORDER — OSELTAMIVIR 75 MG CAPSULE
ORAL_CAPSULE | Freq: Two times a day (BID) | ORAL | 0 refills | 10 days | Status: CP
Start: 2021-01-12 — End: 2021-01-22

## 2021-01-13 ENCOUNTER — Ambulatory Visit
Admit: 2021-01-13 | Discharge: 2021-01-21 | Disposition: A | Discharge status: Home / Self Care | Payer: PRIVATE HEALTH INSURANCE | Source: Ambulatory Visit | Admitting: Internal Medicine

## 2021-01-13 ENCOUNTER — Encounter
Admit: 2021-01-13 | Discharge: 2021-01-21 | Disposition: A | Discharge status: Home / Self Care | Payer: PRIVATE HEALTH INSURANCE | Source: Ambulatory Visit | Attending: Internal Medicine | Admitting: Internal Medicine

## 2021-01-13 ENCOUNTER — Ambulatory Visit: Admit: 2021-01-13 | Discharge: 2021-01-21 | Payer: PRIVATE HEALTH INSURANCE

## 2021-01-13 ENCOUNTER — Ambulatory Visit: Admit: 2021-01-13 | Discharge: 2021-01-13 | Payer: PRIVATE HEALTH INSURANCE

## 2021-01-20 DIAGNOSIS — Z942 Lung transplant status: Principal | ICD-10-CM

## 2021-01-20 MED ORDER — TACROLIMUS 1 MG CAPSULE, IMMEDIATE-RELEASE
ORAL_CAPSULE | Freq: Every evening | ORAL | 3 refills | 90.00000 days | Status: CP
Start: 2021-01-20 — End: 2022-01-20
  Filled 2021-01-21: qty 120, 30d supply, fill #0

## 2021-01-20 MED ORDER — AMIKACIN 500 MG/2 ML INJECTION SOLUTION
11 refills | 0 days | Status: CP
Start: 2021-01-20 — End: 2022-01-20
  Filled 2021-01-21: qty 120, 30d supply, fill #0

## 2021-01-20 MED ORDER — TACROLIMUS 5 MG CAPSULE, IMMEDIATE-RELEASE
ORAL_CAPSULE | Freq: Every day | ORAL | 3 refills | 180.00000 days | Status: CP
Start: 2021-01-20 — End: ?
  Filled 2021-01-21: qty 30, 30d supply, fill #0

## 2021-01-21 MED FILL — MYCOPHENOLATE MOFETIL 500 MG TABLET: ORAL | 30 days supply | Qty: 60 | Fill #10

## 2021-01-21 MED FILL — ALBUTEROL SULFATE 2.5 MG/3 ML (0.083 %) SOLUTION FOR NEBULIZATION: RESPIRATORY_TRACT | 23 days supply | Qty: 270 | Fill #3

## 2021-01-21 MED FILL — VALGANCICLOVIR 450 MG TABLET: ORAL | 90 days supply | Qty: 180 | Fill #2

## 2021-01-21 MED FILL — ATORVASTATIN 20 MG TABLET: ORAL | 30 days supply | Qty: 30 | Fill #6

## 2021-01-22 MED ORDER — PREDNISONE 20 MG TABLET
ORAL_TABLET | Freq: Every day | ORAL | 0 refills | 5.00000 days | Status: CP
Start: 2021-01-22 — End: 2021-01-27

## 2021-01-25 ENCOUNTER — Telehealth: Admit: 2021-01-25 | Discharge: 2021-01-26 | Payer: PRIVATE HEALTH INSURANCE | Attending: Family | Primary: Family

## 2021-01-25 MED ORDER — PREDNISONE 10 MG TABLET
ORAL_TABLET | ORAL | 0 refills | 15 days | Status: CP
Start: 2021-01-25 — End: 2021-02-09
  Filled 2021-03-16: qty 90, 90d supply, fill #3

## 2021-01-25 MED ORDER — OSELTAMIVIR 75 MG CAPSULE
ORAL_CAPSULE | Freq: Two times a day (BID) | ORAL | 0 refills | 10 days | Status: CP
Start: 2021-01-25 — End: 2021-02-04

## 2021-01-28 ENCOUNTER — Ambulatory Visit: Admit: 2021-01-28 | Discharge: 2021-01-29 | Payer: PRIVATE HEALTH INSURANCE

## 2021-01-28 DIAGNOSIS — Z942 Lung transplant status: Principal | ICD-10-CM

## 2021-01-30 DIAGNOSIS — Z942 Lung transplant status: Principal | ICD-10-CM

## 2021-01-30 MED ORDER — TACROLIMUS 5 MG CAPSULE, IMMEDIATE-RELEASE
ORAL_CAPSULE | Freq: Two times a day (BID) | ORAL | 3 refills | 90 days | Status: CP
Start: 2021-01-30 — End: 2022-01-30
  Filled 2021-02-01: qty 60, 30d supply, fill #0

## 2021-01-31 DIAGNOSIS — Z942 Lung transplant status: Principal | ICD-10-CM

## 2021-02-01 ENCOUNTER — Ambulatory Visit: Admit: 2021-02-01 | Discharge: 2021-02-02 | Payer: PRIVATE HEALTH INSURANCE

## 2021-02-01 MED ORDER — MONTELUKAST 10 MG TABLET
ORAL_TABLET | Freq: Every evening | ORAL | 11 refills | 30.00000 days | Status: CP
Start: 2021-02-01 — End: 2021-02-01
  Filled 2021-02-01: qty 90, 90d supply, fill #0

## 2021-02-01 MED ORDER — ALLOPURINOL 100 MG TABLET
ORAL_TABLET | Freq: Every day | ORAL | 3 refills | 90.00000 days | Status: CP
Start: 2021-02-01 — End: 2022-02-01
  Filled 2021-02-01: qty 90, 90d supply, fill #0

## 2021-02-01 MED ORDER — FUROSEMIDE 20 MG TABLET
ORAL_TABLET | Freq: Every day | ORAL | 3 refills | 90 days | Status: CP
Start: 2021-02-01 — End: 2022-02-01
  Filled 2021-02-14: qty 90, 90d supply, fill #0

## 2021-02-01 MED FILL — VALSARTAN 40 MG TABLET: ORAL | 90 days supply | Qty: 90 | Fill #2

## 2021-02-01 MED FILL — ALBUTEROL SULFATE HFA 90 MCG/ACTUATION AEROSOL INHALER: 17 days supply | Qty: 8.5 | Fill #4

## 2021-02-07 DIAGNOSIS — Z942 Lung transplant status: Principal | ICD-10-CM

## 2021-02-10 ENCOUNTER — Ambulatory Visit: Admit: 2021-02-10 | Discharge: 2021-02-10 | Payer: PRIVATE HEALTH INSURANCE

## 2021-02-10 ENCOUNTER — Encounter: Admit: 2021-02-10 | Discharge: 2021-02-10 | Payer: PRIVATE HEALTH INSURANCE | Attending: Family | Primary: Family

## 2021-02-14 DIAGNOSIS — Z942 Lung transplant status: Principal | ICD-10-CM

## 2021-02-14 MED FILL — AZITHROMYCIN 250 MG TABLET: ORAL | 90 days supply | Qty: 90 | Fill #2

## 2021-02-14 MED FILL — MYCOPHENOLATE MOFETIL 500 MG TABLET: ORAL | 30 days supply | Qty: 60 | Fill #11

## 2021-02-14 MED FILL — ALBUTEROL SULFATE HFA 90 MCG/ACTUATION AEROSOL INHALER: 25 days supply | Qty: 8.5 | Fill #5

## 2021-02-14 MED FILL — ATORVASTATIN 20 MG TABLET: ORAL | 30 days supply | Qty: 30 | Fill #7

## 2021-02-18 ENCOUNTER — Ambulatory Visit: Admit: 2021-02-18 | Discharge: 2021-02-19 | Payer: PRIVATE HEALTH INSURANCE

## 2021-02-20 ENCOUNTER — Ambulatory Visit: Admit: 2021-02-20 | Discharge: 2021-02-20 | Payer: PRIVATE HEALTH INSURANCE

## 2021-02-21 ENCOUNTER — Institutional Professional Consult (permissible substitution): Admit: 2021-02-21 | Discharge: 2021-02-22 | Payer: PRIVATE HEALTH INSURANCE

## 2021-02-21 ENCOUNTER — Ambulatory Visit: Admit: 2021-02-21 | Discharge: 2021-02-22 | Payer: PRIVATE HEALTH INSURANCE | Attending: Family | Primary: Family

## 2021-02-21 ENCOUNTER — Encounter: Admit: 2021-02-21 | Discharge: 2021-02-22 | Payer: PRIVATE HEALTH INSURANCE | Attending: Family | Primary: Family

## 2021-02-21 ENCOUNTER — Ambulatory Visit: Admit: 2021-02-21 | Discharge: 2021-02-22 | Payer: PRIVATE HEALTH INSURANCE

## 2021-02-21 DIAGNOSIS — Z942 Lung transplant status: Principal | ICD-10-CM

## 2021-02-21 DIAGNOSIS — B999 Unspecified infectious disease: Principal | ICD-10-CM

## 2021-02-21 DIAGNOSIS — D801 Nonfamilial hypogammaglobulinemia: Principal | ICD-10-CM

## 2021-03-04 ENCOUNTER — Ambulatory Visit: Admit: 2021-03-04 | Discharge: 2021-03-05 | Payer: PRIVATE HEALTH INSURANCE

## 2021-03-04 DIAGNOSIS — Z942 Lung transplant status: Principal | ICD-10-CM

## 2021-03-15 DIAGNOSIS — Z942 Lung transplant status: Principal | ICD-10-CM

## 2021-03-15 MED ORDER — MYCOPHENOLATE MOFETIL 500 MG TABLET
ORAL_TABLET | Freq: Two times a day (BID) | ORAL | 3 refills | 90 days | Status: CP
Start: 2021-03-15 — End: 2021-06-13
  Filled 2021-03-16: qty 60, 30d supply, fill #0

## 2021-03-15 MED ORDER — ALBUTEROL SULFATE HFA 90 MCG/ACTUATION AEROSOL INHALER
11 refills | 0 days | Status: CP
Start: 2021-03-15 — End: 2022-03-15

## 2021-03-15 MED ORDER — ALBUTEROL SULFATE 2.5 MG/3 ML (0.083 %) SOLUTION FOR NEBULIZATION
RESPIRATORY_TRACT | 3 refills | 15 days | Status: CP | PRN
Start: 2021-03-15 — End: 2022-03-15
  Filled 2021-03-16: qty 8.5, 25d supply, fill #0
  Filled 2021-03-16: qty 270, 23d supply, fill #0

## 2021-03-15 MED ORDER — COLCHICINE 0.6 MG TABLET
ORAL_TABLET | Freq: Every day | ORAL | 11 refills | 30 days | Status: CP | PRN
Start: 2021-03-15 — End: 2022-03-15
  Filled 2021-03-16: qty 30, 30d supply, fill #0

## 2021-03-16 MED FILL — TACROLIMUS 5 MG CAPSULE, IMMEDIATE-RELEASE: ORAL | 30 days supply | Qty: 60 | Fill #1

## 2021-03-16 MED FILL — PREDNISONE 2.5 MG TABLET: ORAL | 90 days supply | Qty: 90 | Fill #2

## 2021-03-16 MED FILL — ATORVASTATIN 20 MG TABLET: ORAL | 30 days supply | Qty: 30 | Fill #8

## 2021-03-16 MED FILL — AMIKACIN 500 MG/2 ML INJECTION SOLUTION: 30 days supply | Qty: 120 | Fill #1

## 2021-03-16 MED FILL — PANTOPRAZOLE 40 MG TABLET,DELAYED RELEASE: ORAL | 90 days supply | Qty: 180 | Fill #3

## 2021-03-18 DIAGNOSIS — B999 Unspecified infectious disease: Principal | ICD-10-CM

## 2021-03-18 DIAGNOSIS — Z942 Lung transplant status: Principal | ICD-10-CM

## 2021-03-18 DIAGNOSIS — D801 Nonfamilial hypogammaglobulinemia: Principal | ICD-10-CM

## 2021-03-24 ENCOUNTER — Ambulatory Visit: Admit: 2021-03-24 | Discharge: 2021-03-24 | Payer: PRIVATE HEALTH INSURANCE

## 2021-03-24 ENCOUNTER — Encounter: Admit: 2021-03-24 | Discharge: 2021-03-24 | Payer: PRIVATE HEALTH INSURANCE | Attending: Family | Primary: Family

## 2021-03-24 ENCOUNTER — Institutional Professional Consult (permissible substitution): Admit: 2021-03-24 | Discharge: 2021-03-24 | Payer: PRIVATE HEALTH INSURANCE

## 2021-03-24 DIAGNOSIS — Q348 Other specified congenital malformations of respiratory system: Principal | ICD-10-CM

## 2021-03-24 DIAGNOSIS — A498 Other bacterial infections of unspecified site: Principal | ICD-10-CM

## 2021-03-24 DIAGNOSIS — Z942 Lung transplant status: Principal | ICD-10-CM

## 2021-03-24 MED ORDER — AZTREONAM LYSINE 75 MG/ML SOLUTION FOR NEBULIZATION
Freq: Three times a day (TID) | RESPIRATORY_TRACT | 3 refills | 90.00000 days | Status: CP
Start: 2021-03-24 — End: 2022-03-24

## 2021-03-26 MED ORDER — VALGANCICLOVIR 450 MG TABLET
ORAL_TABLET | Freq: Two times a day (BID) | ORAL | 3 refills | 90 days | Status: CP
Start: 2021-03-26 — End: 2022-03-26

## 2021-03-27 MED ORDER — VALGANCICLOVIR 450 MG TABLET
ORAL_TABLET | Freq: Two times a day (BID) | ORAL | 3 refills | 90 days | Status: CP
Start: 2021-03-27 — End: 2022-03-27
  Filled 2021-04-17: qty 360, 90d supply, fill #0

## 2021-03-31 DIAGNOSIS — A498 Other bacterial infections of unspecified site: Principal | ICD-10-CM

## 2021-03-31 DIAGNOSIS — Q348 Other specified congenital malformations of respiratory system: Principal | ICD-10-CM

## 2021-03-31 DIAGNOSIS — Z942 Lung transplant status: Principal | ICD-10-CM

## 2021-03-31 MED ORDER — AZTREONAM LYSINE 75 MG/ML SOLUTION FOR NEBULIZATION
Freq: Three times a day (TID) | RESPIRATORY_TRACT | 3 refills | 90.00000 days | Status: CP
Start: 2021-03-31 — End: 2021-03-31

## 2021-04-06 ENCOUNTER — Ambulatory Visit: Admit: 2021-04-06 | Discharge: 2021-04-07 | Payer: PRIVATE HEALTH INSURANCE

## 2021-04-06 DIAGNOSIS — Z942 Lung transplant status: Principal | ICD-10-CM

## 2021-04-15 DIAGNOSIS — B999 Unspecified infectious disease: Principal | ICD-10-CM

## 2021-04-15 DIAGNOSIS — Z942 Lung transplant status: Principal | ICD-10-CM

## 2021-04-15 DIAGNOSIS — D801 Nonfamilial hypogammaglobulinemia: Principal | ICD-10-CM

## 2021-04-17 MED ORDER — PANTOPRAZOLE 40 MG TABLET,DELAYED RELEASE
ORAL_TABLET | Freq: Two times a day (BID) | ORAL | 3 refills | 90 days | Status: CP
Start: 2021-04-17 — End: 2022-04-17
  Filled 2021-06-16: qty 180, 90d supply, fill #0

## 2021-04-17 MED ORDER — BUDESONIDE-FORMOTEROL HFA 160 MCG-4.5 MCG/ACTUATION AEROSOL INHALER
Freq: Two times a day (BID) | RESPIRATORY_TRACT | 11 refills | 31 days | Status: CP
Start: 2021-04-17 — End: 2022-04-17

## 2021-04-17 MED FILL — COLCHICINE 0.6 MG TABLET: ORAL | 30 days supply | Qty: 30 | Fill #1

## 2021-04-17 MED FILL — MYCOPHENOLATE MOFETIL 500 MG TABLET: ORAL | 30 days supply | Qty: 60 | Fill #1

## 2021-04-17 MED FILL — ALBUTEROL SULFATE 2.5 MG/3 ML (0.083 %) SOLUTION FOR NEBULIZATION: RESPIRATORY_TRACT | 23 days supply | Qty: 270 | Fill #1

## 2021-04-17 MED FILL — ALBUTEROL SULFATE HFA 90 MCG/ACTUATION AEROSOL INHALER: 25 days supply | Qty: 8.5 | Fill #1

## 2021-04-17 MED FILL — BD LUER-LOK SYRINGE 3 ML 20 GAUGE X 1 1/2": 82 days supply | Qty: 82 | Fill #1

## 2021-04-17 MED FILL — SYMBICORT 160 MCG-4.5 MCG/ACTUATION HFA AEROSOL INHALER: RESPIRATORY_TRACT | 30 days supply | Qty: 10.2 | Fill #0

## 2021-04-17 MED FILL — TACROLIMUS 5 MG CAPSULE, IMMEDIATE-RELEASE: ORAL | 30 days supply | Qty: 60 | Fill #2

## 2021-04-17 MED FILL — ATORVASTATIN 20 MG TABLET: ORAL | 30 days supply | Qty: 30 | Fill #9

## 2021-04-24 ENCOUNTER — Ambulatory Visit: Admit: 2021-04-24 | Discharge: 2021-04-24 | Payer: PRIVATE HEALTH INSURANCE

## 2021-05-13 DIAGNOSIS — D801 Nonfamilial hypogammaglobulinemia: Principal | ICD-10-CM

## 2021-05-13 DIAGNOSIS — B999 Unspecified infectious disease: Principal | ICD-10-CM

## 2021-05-13 DIAGNOSIS — Z942 Lung transplant status: Principal | ICD-10-CM

## 2021-05-15 ENCOUNTER — Institutional Professional Consult (permissible substitution): Admit: 2021-05-15 | Discharge: 2021-05-16 | Payer: PRIVATE HEALTH INSURANCE

## 2021-05-15 DIAGNOSIS — Q348 Other specified congenital malformations of respiratory system: Principal | ICD-10-CM

## 2021-05-15 MED FILL — TACROLIMUS 5 MG CAPSULE, IMMEDIATE-RELEASE: ORAL | 30 days supply | Qty: 60 | Fill #3

## 2021-05-15 MED FILL — MONTELUKAST 10 MG TABLET: ORAL | 90 days supply | Qty: 90 | Fill #1

## 2021-05-15 MED FILL — VALSARTAN 40 MG TABLET: ORAL | 90 days supply | Qty: 90 | Fill #3

## 2021-05-23 MED FILL — AZITHROMYCIN 250 MG TABLET: ORAL | 90 days supply | Qty: 90 | Fill #3

## 2021-05-23 MED FILL — FUROSEMIDE 20 MG TABLET: ORAL | 90 days supply | Qty: 90 | Fill #1

## 2021-05-23 MED FILL — ATORVASTATIN 20 MG TABLET: ORAL | 30 days supply | Qty: 30 | Fill #10

## 2021-05-31 MED ORDER — PREDNISONE 5 MG TABLET
ORAL_TABLET | 3 refills | 0 days | Status: CP
Start: 2021-05-31 — End: 2022-05-31
  Filled 2021-06-28: qty 90, 90d supply, fill #0

## 2021-05-31 MED FILL — ALLOPURINOL 100 MG TABLET: ORAL | 90 days supply | Qty: 90 | Fill #1

## 2021-05-31 MED FILL — ALBUTEROL SULFATE HFA 90 MCG/ACTUATION AEROSOL INHALER: 25 days supply | Qty: 8.5 | Fill #2

## 2021-05-31 MED FILL — ALBUTEROL SULFATE 2.5 MG/3 ML (0.083 %) SOLUTION FOR NEBULIZATION: RESPIRATORY_TRACT | 23 days supply | Qty: 270 | Fill #2

## 2021-05-31 MED FILL — AMIKACIN 500 MG/2 ML INJECTION SOLUTION: 30 days supply | Qty: 120 | Fill #2

## 2021-05-31 MED FILL — SYMBICORT 160 MCG-4.5 MCG/ACTUATION HFA AEROSOL INHALER: RESPIRATORY_TRACT | 30 days supply | Qty: 10.2 | Fill #1

## 2021-05-31 MED FILL — MYCOPHENOLATE MOFETIL 500 MG TABLET: ORAL | 30 days supply | Qty: 60 | Fill #2

## 2021-05-31 MED FILL — COLCHICINE 0.6 MG TABLET: ORAL | 30 days supply | Qty: 30 | Fill #2

## 2021-06-12 ENCOUNTER — Institutional Professional Consult (permissible substitution): Admit: 2021-06-12 | Discharge: 2021-06-13 | Payer: PRIVATE HEALTH INSURANCE

## 2021-06-12 DIAGNOSIS — Q348 Other specified congenital malformations of respiratory system: Principal | ICD-10-CM

## 2021-06-16 MED FILL — TACROLIMUS 5 MG CAPSULE, IMMEDIATE-RELEASE: ORAL | 30 days supply | Qty: 60 | Fill #4

## 2021-06-26 MED ORDER — FLUOROURACIL 5 % TOPICAL CREAM
Freq: Two times a day (BID) | TOPICAL | 0 refills | 14 days
Start: 2021-06-26 — End: 2021-07-10

## 2021-06-26 MED ORDER — KETOCONAZOLE 2 % SHAMPOO
TOPICAL | 0 refills | 0 days
Start: 2021-06-26 — End: ?

## 2021-06-28 DIAGNOSIS — Z942 Lung transplant status: Principal | ICD-10-CM

## 2021-06-28 MED ORDER — MYCOPHENOLATE MOFETIL 500 MG TABLET
ORAL_TABLET | Freq: Two times a day (BID) | ORAL | 3 refills | 90 days | Status: CP
Start: 2021-06-28 — End: 2021-09-26
  Filled 2021-06-28: qty 60, 30d supply, fill #0

## 2021-06-28 MED FILL — ATORVASTATIN 20 MG TABLET: ORAL | 30 days supply | Qty: 30 | Fill #11

## 2021-06-28 MED FILL — FLUOROURACIL 5 % TOPICAL CREAM: TOPICAL | 14 days supply | Qty: 40 | Fill #0

## 2021-06-28 MED FILL — SYMBICORT 160 MCG-4.5 MCG/ACTUATION HFA AEROSOL INHALER: RESPIRATORY_TRACT | 30 days supply | Qty: 10.2 | Fill #2

## 2021-06-28 MED FILL — PREDNISONE 2.5 MG TABLET: ORAL | 90 days supply | Qty: 90 | Fill #3

## 2021-06-28 MED FILL — KETOCONAZOLE 2 % SHAMPOO: TOPICAL | 30 days supply | Qty: 120 | Fill #0

## 2021-06-28 MED FILL — ALBUTEROL SULFATE 2.5 MG/3 ML (0.083 %) SOLUTION FOR NEBULIZATION: RESPIRATORY_TRACT | 13 days supply | Qty: 225 | Fill #3

## 2021-06-28 MED FILL — COLCHICINE 0.6 MG TABLET: ORAL | 30 days supply | Qty: 30 | Fill #3

## 2021-06-28 MED FILL — ALBUTEROL SULFATE HFA 90 MCG/ACTUATION AEROSOL INHALER: 25 days supply | Qty: 8.5 | Fill #3

## 2021-07-13 ENCOUNTER — Institutional Professional Consult (permissible substitution): Admit: 2021-07-13 | Discharge: 2021-07-14 | Payer: PRIVATE HEALTH INSURANCE

## 2021-07-17 MED FILL — TACROLIMUS 5 MG CAPSULE, IMMEDIATE-RELEASE: ORAL | 30 days supply | Qty: 60 | Fill #5

## 2021-07-17 MED FILL — VALGANCICLOVIR 450 MG TABLET: ORAL | 4 days supply | Qty: 14 | Fill #1

## 2021-07-28 MED ORDER — PREDNISONE 2.5 MG TABLET
ORAL_TABLET | Freq: Every day | ORAL | 3 refills | 90 days | Status: CP
Start: 2021-07-28 — End: ?
  Filled 2021-10-02: qty 90, 90d supply, fill #0

## 2021-07-28 MED ORDER — ATORVASTATIN 20 MG TABLET
ORAL_TABLET | Freq: Every day | ORAL | 11 refills | 30 days | Status: CP
Start: 2021-07-28 — End: 2022-07-28
  Filled 2021-07-31: qty 30, 30d supply, fill #0

## 2021-07-28 MED ORDER — ALBUTEROL SULFATE 2.5 MG/3 ML (0.083 %) SOLUTION FOR NEBULIZATION
RESPIRATORY_TRACT | 3 refills | 17 days | Status: CP | PRN
Start: 2021-07-28 — End: 2022-07-28
  Filled 2021-07-31: qty 300, 25d supply, fill #0

## 2021-07-29 MED ORDER — AMIKACIN 500 MG/2 ML INJECTION SOLUTION
Freq: Two times a day (BID) | RESPIRATORY_TRACT | 11 refills | 30 days | Status: CP
Start: 2021-07-29 — End: 2022-07-29
  Filled 2021-07-31: qty 120, 30d supply, fill #0

## 2021-07-31 MED ORDER — BD LUER-LOK SYRINGE 3 ML 25 X 5/8"
0 refills | 0 days
Start: 2021-07-31 — End: ?

## 2021-07-31 MED ORDER — BD LUER-LOK SYRINGE 3 ML 21 GAUGE X 1"
3 refills | 0 days
Start: 2021-07-31 — End: ?

## 2021-07-31 MED FILL — SYMBICORT 160 MCG-4.5 MCG/ACTUATION HFA AEROSOL INHALER: RESPIRATORY_TRACT | 30 days supply | Qty: 10.2 | Fill #3

## 2021-07-31 MED FILL — MYCOPHENOLATE MOFETIL 500 MG TABLET: ORAL | 30 days supply | Qty: 60 | Fill #1

## 2021-07-31 MED FILL — VALGANCICLOVIR 450 MG TABLET: ORAL | 4 days supply | Qty: 14 | Fill #2

## 2021-07-31 MED FILL — ALBUTEROL SULFATE HFA 90 MCG/ACTUATION AEROSOL INHALER: 25 days supply | Qty: 8.5 | Fill #4

## 2021-07-31 MED FILL — BD LUER-LOK SYRINGE 3 ML 21 GAUGE X 1": 50 days supply | Qty: 100 | Fill #0

## 2021-08-01 ENCOUNTER — Ambulatory Visit: Admit: 2021-08-01 | Discharge: 2021-08-02 | Payer: PRIVATE HEALTH INSURANCE

## 2021-08-01 DIAGNOSIS — B351 Tinea unguium: Principal | ICD-10-CM

## 2021-08-01 DIAGNOSIS — D492 Neoplasm of unspecified behavior of bone, soft tissue, and skin: Principal | ICD-10-CM

## 2021-08-01 DIAGNOSIS — Q828 Other specified congenital malformations of skin: Principal | ICD-10-CM

## 2021-08-01 DIAGNOSIS — D1801 Hemangioma of skin and subcutaneous tissue: Principal | ICD-10-CM

## 2021-08-01 DIAGNOSIS — L814 Other melanin hyperpigmentation: Principal | ICD-10-CM

## 2021-08-01 DIAGNOSIS — D229 Melanocytic nevi, unspecified: Principal | ICD-10-CM

## 2021-08-01 DIAGNOSIS — L821 Other seborrheic keratosis: Principal | ICD-10-CM

## 2021-08-01 DIAGNOSIS — B353 Tinea pedis: Principal | ICD-10-CM

## 2021-08-04 DIAGNOSIS — C4491 Basal cell carcinoma of skin, unspecified: Principal | ICD-10-CM

## 2021-08-08 DIAGNOSIS — Z942 Lung transplant status: Principal | ICD-10-CM

## 2021-08-10 ENCOUNTER — Encounter: Admit: 2021-08-10 | Discharge: 2021-08-11 | Payer: PRIVATE HEALTH INSURANCE | Attending: Family | Primary: Family

## 2021-08-10 ENCOUNTER — Ambulatory Visit: Admit: 2021-08-10 | Discharge: 2021-08-11 | Payer: PRIVATE HEALTH INSURANCE

## 2021-08-10 ENCOUNTER — Ambulatory Visit: Admit: 2021-08-10 | Discharge: 2021-08-11 | Payer: PRIVATE HEALTH INSURANCE | Attending: Family | Primary: Family

## 2021-08-10 ENCOUNTER — Institutional Professional Consult (permissible substitution): Admit: 2021-08-10 | Discharge: 2021-08-11 | Payer: PRIVATE HEALTH INSURANCE

## 2021-08-10 DIAGNOSIS — Z942 Lung transplant status: Principal | ICD-10-CM

## 2021-08-10 DIAGNOSIS — N1832 Stage 3b chronic kidney disease (CMS-HCC): Principal | ICD-10-CM

## 2021-08-10 DIAGNOSIS — Q348 Other specified congenital malformations of respiratory system: Principal | ICD-10-CM

## 2021-08-10 DIAGNOSIS — R600 Localized edema: Principal | ICD-10-CM

## 2021-08-10 DIAGNOSIS — D702 Other drug-induced agranulocytosis: Principal | ICD-10-CM

## 2021-08-10 DIAGNOSIS — B259 Cytomegaloviral disease, unspecified: Principal | ICD-10-CM

## 2021-08-10 MED ORDER — VALGANCICLOVIR 450 MG TABLET
ORAL_TABLET | ORAL | 1 refills | 30 days | Status: CP
Start: 2021-08-10 — End: 2022-08-10
  Filled 2021-08-10: qty 15, 30d supply, fill #0

## 2021-08-10 MED ORDER — FUROSEMIDE 20 MG TABLET
ORAL_TABLET | Freq: Every day | ORAL | 3 refills | 90 days | Status: CP
Start: 2021-08-10 — End: 2022-08-10

## 2021-08-10 MED ORDER — MYCOPHENOLATE MOFETIL 250 MG CAPSULE
ORAL_CAPSULE | Freq: Two times a day (BID) | ORAL | 1 refills | 30 days | Status: CP
Start: 2021-08-10 — End: 2022-08-10
  Filled 2021-08-10: qty 60, 30d supply, fill #0

## 2021-08-14 ENCOUNTER — Institutional Professional Consult (permissible substitution): Admit: 2021-08-14 | Discharge: 2021-08-15 | Payer: PRIVATE HEALTH INSURANCE

## 2021-08-14 DIAGNOSIS — Z942 Lung transplant status: Principal | ICD-10-CM

## 2021-08-16 DIAGNOSIS — Z942 Lung transplant status: Principal | ICD-10-CM

## 2021-08-16 MED ORDER — AZITHROMYCIN 250 MG TABLET
ORAL_TABLET | Freq: Every day | ORAL | 3 refills | 90 days | Status: CP
Start: 2021-08-16 — End: 2021-11-14
  Filled 2021-08-16: qty 90, 90d supply, fill #0

## 2021-08-16 MED FILL — MONTELUKAST 10 MG TABLET: ORAL | 90 days supply | Qty: 90 | Fill #2

## 2021-08-16 MED FILL — TACROLIMUS 5 MG CAPSULE, IMMEDIATE-RELEASE: ORAL | 30 days supply | Qty: 60 | Fill #6

## 2021-08-18 ENCOUNTER — Institutional Professional Consult (permissible substitution): Admit: 2021-08-18 | Discharge: 2021-08-19 | Payer: PRIVATE HEALTH INSURANCE

## 2021-08-18 DIAGNOSIS — R600 Localized edema: Principal | ICD-10-CM

## 2021-08-18 DIAGNOSIS — Z942 Lung transplant status: Principal | ICD-10-CM

## 2021-08-20 DIAGNOSIS — M79662 Pain in left lower leg: Principal | ICD-10-CM

## 2021-08-20 DIAGNOSIS — M79661 Pain in right lower leg: Principal | ICD-10-CM

## 2021-08-20 DIAGNOSIS — R600 Localized edema: Principal | ICD-10-CM

## 2021-08-21 ENCOUNTER — Institutional Professional Consult (permissible substitution): Admit: 2021-08-21 | Discharge: 2021-08-22 | Payer: PRIVATE HEALTH INSURANCE

## 2021-08-21 ENCOUNTER — Ambulatory Visit: Admit: 2021-08-21 | Discharge: 2021-08-22 | Payer: PRIVATE HEALTH INSURANCE

## 2021-08-21 DIAGNOSIS — D702 Other drug-induced agranulocytosis: Principal | ICD-10-CM

## 2021-08-21 DIAGNOSIS — Z942 Lung transplant status: Principal | ICD-10-CM

## 2021-08-24 ENCOUNTER — Institutional Professional Consult (permissible substitution): Admit: 2021-08-24 | Discharge: 2021-08-24 | Payer: PRIVATE HEALTH INSURANCE

## 2021-08-24 DIAGNOSIS — Z942 Lung transplant status: Principal | ICD-10-CM

## 2021-08-24 DIAGNOSIS — D702 Other drug-induced agranulocytosis: Principal | ICD-10-CM

## 2021-08-24 DIAGNOSIS — Q348 Other specified congenital malformations of respiratory system: Principal | ICD-10-CM

## 2021-08-24 MED ORDER — NIVESTYM 300 MCG/0.5 ML SUBCUTANEOUS SYRINGE
Freq: Every day | SUBCUTANEOUS | 0 refills | 3 days | Status: CP
Start: 2021-08-24 — End: 2021-08-27

## 2021-08-24 MED FILL — ALLOPURINOL 100 MG TABLET: ORAL | 90 days supply | Qty: 90 | Fill #2

## 2021-08-24 MED FILL — VALSARTAN 40 MG TABLET: ORAL | 60 days supply | Qty: 60 | Fill #4

## 2021-08-25 ENCOUNTER — Institutional Professional Consult (permissible substitution): Admit: 2021-08-25 | Discharge: 2021-08-26 | Payer: PRIVATE HEALTH INSURANCE

## 2021-08-25 DIAGNOSIS — D702 Other drug-induced agranulocytosis: Principal | ICD-10-CM

## 2021-08-25 MED ORDER — NIVESTYM 300 MCG/0.5 ML SUBCUTANEOUS SYRINGE
Freq: Every day | SUBCUTANEOUS | 0 refills | 3 days | Status: CP
Start: 2021-08-25 — End: 2021-08-28

## 2021-08-28 ENCOUNTER — Ambulatory Visit
Admit: 2021-08-28 | Discharge: 2021-08-30 | Disposition: A | Discharge status: Home / Self Care | Payer: PRIVATE HEALTH INSURANCE | Admitting: Infectious Disease

## 2021-08-28 ENCOUNTER — Encounter
Admit: 2021-08-28 | Discharge: 2021-08-30 | Disposition: A | Discharge status: Home / Self Care | Payer: PRIVATE HEALTH INSURANCE | Admitting: Infectious Disease

## 2021-08-28 ENCOUNTER — Other Ambulatory Visit: Admit: 2021-08-28 | Discharge: 2021-08-28 | Payer: PRIVATE HEALTH INSURANCE

## 2021-08-28 ENCOUNTER — Encounter: Admit: 2021-08-28 | Discharge: 2021-08-28 | Payer: PRIVATE HEALTH INSURANCE | Attending: Family | Primary: Family

## 2021-08-28 MED FILL — ATORVASTATIN 20 MG TABLET: ORAL | 30 days supply | Qty: 30 | Fill #1

## 2021-08-30 DIAGNOSIS — Z942 Lung transplant status: Principal | ICD-10-CM

## 2021-08-30 MED ORDER — FUROSEMIDE 40 MG TABLET
ORAL_TABLET | Freq: Two times a day (BID) | ORAL | 0 refills | 30 days | Status: CP
Start: 2021-08-30 — End: 2021-09-29
  Filled 2021-09-14: qty 60, 30d supply, fill #0

## 2021-08-30 MED ORDER — ALBUTEROL SULFATE 2.5 MG/3 ML (0.083 %) SOLUTION FOR NEBULIZATION
RESPIRATORY_TRACT | 0 refills | 5 days | Status: CP | PRN
Start: 2021-08-30 — End: 2022-08-30
  Filled 2021-10-12: qty 75, 7d supply, fill #0

## 2021-08-31 ENCOUNTER — Ambulatory Visit
Admit: 2021-08-31 | Discharge: 2021-09-01 | Payer: PRIVATE HEALTH INSURANCE | Attending: MOHS-Micrographic Surgery | Primary: MOHS-Micrographic Surgery

## 2021-09-01 ENCOUNTER — Institutional Professional Consult (permissible substitution): Admit: 2021-09-01 | Discharge: 2021-09-02 | Payer: PRIVATE HEALTH INSURANCE

## 2021-09-01 DIAGNOSIS — Z942 Lung transplant status: Principal | ICD-10-CM

## 2021-09-01 DIAGNOSIS — Q348 Other specified congenital malformations of respiratory system: Principal | ICD-10-CM

## 2021-09-04 ENCOUNTER — Institutional Professional Consult (permissible substitution): Admit: 2021-09-04 | Discharge: 2021-09-04 | Payer: PRIVATE HEALTH INSURANCE

## 2021-09-04 ENCOUNTER — Encounter: Admit: 2021-09-04 | Discharge: 2021-09-04 | Payer: PRIVATE HEALTH INSURANCE | Attending: Family | Primary: Family

## 2021-09-04 DIAGNOSIS — Z942 Lung transplant status: Principal | ICD-10-CM

## 2021-09-06 DIAGNOSIS — B259 Cytomegaloviral disease, unspecified: Principal | ICD-10-CM

## 2021-09-06 MED ORDER — VALGANCICLOVIR 450 MG TABLET
ORAL_TABLET | Freq: Every day | ORAL | 3 refills | 90 days | Status: CP
Start: 2021-09-06 — End: 2022-09-06
  Filled 2021-09-14: qty 90, 90d supply, fill #0

## 2021-09-07 ENCOUNTER — Institutional Professional Consult (permissible substitution): Admit: 2021-09-07 | Discharge: 2021-09-08 | Payer: PRIVATE HEALTH INSURANCE

## 2021-09-07 DIAGNOSIS — Q348 Other specified congenital malformations of respiratory system: Principal | ICD-10-CM

## 2021-09-07 DIAGNOSIS — Z942 Lung transplant status: Principal | ICD-10-CM

## 2021-09-11 ENCOUNTER — Ambulatory Visit: Admit: 2021-09-11 | Payer: PRIVATE HEALTH INSURANCE

## 2021-09-14 ENCOUNTER — Ambulatory Visit: Admit: 2021-09-14 | Discharge: 2021-09-14 | Payer: PRIVATE HEALTH INSURANCE

## 2021-09-14 ENCOUNTER — Encounter: Admit: 2021-09-14 | Discharge: 2021-09-14 | Payer: PRIVATE HEALTH INSURANCE | Attending: Family | Primary: Family

## 2021-09-14 MED FILL — SYMBICORT 160 MCG-4.5 MCG/ACTUATION HFA AEROSOL INHALER: RESPIRATORY_TRACT | 30 days supply | Qty: 10.2 | Fill #4

## 2021-09-14 MED FILL — BD LUER-LOK SYRINGE 3 ML 21 GAUGE X 1": 50 days supply | Qty: 100 | Fill #1

## 2021-09-14 MED FILL — TACROLIMUS 5 MG CAPSULE, IMMEDIATE-RELEASE: ORAL | 30 days supply | Qty: 60 | Fill #7

## 2021-09-14 MED FILL — ALBUTEROL SULFATE HFA 90 MCG/ACTUATION AEROSOL INHALER: 25 days supply | Qty: 8.5 | Fill #5

## 2021-09-14 MED FILL — PANTOPRAZOLE 40 MG TABLET,DELAYED RELEASE: ORAL | 90 days supply | Qty: 180 | Fill #1

## 2021-09-14 MED FILL — AMIKACIN 500 MG/2 ML INJECTION SOLUTION: RESPIRATORY_TRACT | 30 days supply | Qty: 120 | Fill #1

## 2021-09-15 ENCOUNTER — Ambulatory Visit: Admit: 2021-09-15 | Discharge: 2021-09-16 | Payer: PRIVATE HEALTH INSURANCE

## 2021-09-15 DIAGNOSIS — Z942 Lung transplant status: Principal | ICD-10-CM

## 2021-09-15 DIAGNOSIS — R600 Localized edema: Principal | ICD-10-CM

## 2021-09-21 ENCOUNTER — Ambulatory Visit: Admit: 2021-09-21 | Discharge: 2021-09-22 | Payer: PRIVATE HEALTH INSURANCE

## 2021-09-25 DIAGNOSIS — I471 Supraventricular tachycardia: Principal | ICD-10-CM

## 2021-09-27 ENCOUNTER — Institutional Professional Consult (permissible substitution): Admit: 2021-09-27 | Discharge: 2021-09-28 | Payer: PRIVATE HEALTH INSURANCE

## 2021-10-02 MED FILL — PREDNISONE 5 MG TABLET: 90 days supply | Qty: 90 | Fill #1

## 2021-10-02 MED FILL — ATORVASTATIN 20 MG TABLET: ORAL | 30 days supply | Qty: 30 | Fill #2

## 2021-10-11 DIAGNOSIS — Z942 Lung transplant status: Principal | ICD-10-CM

## 2021-10-12 ENCOUNTER — Ambulatory Visit: Admit: 2021-10-12 | Discharge: 2021-10-13 | Payer: PRIVATE HEALTH INSURANCE

## 2021-10-12 MED FILL — TACROLIMUS 5 MG CAPSULE, IMMEDIATE-RELEASE: ORAL | 30 days supply | Qty: 60 | Fill #8

## 2021-10-25 ENCOUNTER — Ambulatory Visit
Admit: 2021-10-25 | Discharge: 2021-10-26 | Payer: PRIVATE HEALTH INSURANCE | Attending: Adult Health | Primary: Adult Health

## 2021-10-25 MED ORDER — METOPROLOL TARTRATE 25 MG TABLET
ORAL_TABLET | ORAL | 0 refills | 5 days | Status: CP | PRN
Start: 2021-10-25 — End: 2022-10-25
  Filled 2021-10-27: qty 30, 5d supply, fill #0

## 2021-10-27 ENCOUNTER — Institutional Professional Consult (permissible substitution): Admit: 2021-10-27 | Discharge: 2021-10-28 | Payer: PRIVATE HEALTH INSURANCE

## 2021-10-27 DIAGNOSIS — Z942 Lung transplant status: Principal | ICD-10-CM

## 2021-10-27 MED ORDER — TACROLIMUS 5 MG CAPSULE, IMMEDIATE-RELEASE
ORAL_CAPSULE | Freq: Two times a day (BID) | ORAL | 3 refills | 90 days | Status: CP
Start: 2021-10-27 — End: 2022-10-27

## 2021-10-27 MED ORDER — TACROLIMUS 1 MG CAPSULE, IMMEDIATE-RELEASE
ORAL_CAPSULE | Freq: Every day | ORAL | 3 refills | 90 days | Status: CP
Start: 2021-10-27 — End: 2022-10-27
  Filled 2021-10-27: qty 30, 30d supply, fill #0

## 2021-10-27 MED ORDER — ALBUTEROL SULFATE 2.5 MG/3 ML (0.083 %) SOLUTION FOR NEBULIZATION
RESPIRATORY_TRACT | 3 refills | 13 days | Status: CP | PRN
Start: 2021-10-27 — End: 2022-10-27
  Filled 2021-10-27: qty 225, 19d supply, fill #0

## 2021-10-27 MED FILL — ALBUTEROL SULFATE HFA 90 MCG/ACTUATION AEROSOL INHALER: 25 days supply | Qty: 8.5 | Fill #6

## 2021-10-27 MED FILL — AMIKACIN 500 MG/2 ML INJECTION SOLUTION: RESPIRATORY_TRACT | 30 days supply | Qty: 120 | Fill #2

## 2021-10-27 MED FILL — SYMBICORT 160 MCG-4.5 MCG/ACTUATION HFA AEROSOL INHALER: RESPIRATORY_TRACT | 30 days supply | Qty: 10.2 | Fill #5

## 2021-10-28 ENCOUNTER — Ambulatory Visit: Admit: 2021-10-28 | Discharge: 2021-10-29 | Payer: PRIVATE HEALTH INSURANCE | Attending: Family | Primary: Family

## 2021-10-28 DIAGNOSIS — J069 Acute upper respiratory infection, unspecified: Principal | ICD-10-CM

## 2021-10-30 DIAGNOSIS — Z942 Lung transplant status: Principal | ICD-10-CM

## 2021-10-30 MED ORDER — VALSARTAN 40 MG TABLET
ORAL_TABLET | Freq: Every day | ORAL | 3 refills | 90 days | Status: CP
Start: 2021-10-30 — End: 2022-10-30
  Filled 2021-11-03: qty 90, 90d supply, fill #0

## 2021-10-31 MED ORDER — FUROSEMIDE 40 MG TABLET
ORAL_TABLET | Freq: Two times a day (BID) | ORAL | 0 refills | 30 days | Status: CP
Start: 2021-10-31 — End: 2021-11-30
  Filled 2021-11-03: qty 60, 30d supply, fill #0

## 2021-11-03 MED FILL — ATORVASTATIN 20 MG TABLET: ORAL | 30 days supply | Qty: 30 | Fill #3

## 2021-11-07 ENCOUNTER — Ambulatory Visit: Admit: 2021-11-07 | Discharge: 2021-11-08 | Payer: PRIVATE HEALTH INSURANCE

## 2021-11-15 ENCOUNTER — Ambulatory Visit
Admit: 2021-11-15 | Discharge: 2021-11-16 | Payer: PRIVATE HEALTH INSURANCE | Attending: Student in an Organized Health Care Education/Training Program | Primary: Student in an Organized Health Care Education/Training Program

## 2021-11-15 DIAGNOSIS — D369 Benign neoplasm, unspecified site: Principal | ICD-10-CM

## 2021-11-15 DIAGNOSIS — Z85828 Personal history of other malignant neoplasm of skin: Principal | ICD-10-CM

## 2021-11-15 DIAGNOSIS — L84 Corns and callosities: Principal | ICD-10-CM

## 2021-11-15 DIAGNOSIS — Z942 Lung transplant status: Principal | ICD-10-CM

## 2021-11-17 MED FILL — ALBUTEROL SULFATE HFA 90 MCG/ACTUATION AEROSOL INHALER: 25 days supply | Qty: 8.5 | Fill #7

## 2021-11-17 MED FILL — MONTELUKAST 10 MG TABLET: ORAL | 90 days supply | Qty: 90 | Fill #3

## 2021-11-17 MED FILL — TACROLIMUS 5 MG CAPSULE, IMMEDIATE-RELEASE: ORAL | 30 days supply | Qty: 60 | Fill #0

## 2021-11-17 MED FILL — AZITHROMYCIN 250 MG TABLET: ORAL | 90 days supply | Qty: 90 | Fill #1

## 2021-11-20 DIAGNOSIS — A498 Other bacterial infections of unspecified site: Principal | ICD-10-CM

## 2021-11-20 DIAGNOSIS — Q348 Other specified congenital malformations of respiratory system: Principal | ICD-10-CM

## 2021-11-20 DIAGNOSIS — Z942 Lung transplant status: Principal | ICD-10-CM

## 2021-11-20 MED ORDER — CAYSTON 75 MG/ML SOLUTION FOR NEBULIZATION
0 refills | 0 days | Status: CP
Start: 2021-11-20 — End: ?

## 2021-11-21 ENCOUNTER — Ambulatory Visit: Admit: 2021-11-21 | Discharge: 2021-11-22 | Payer: PRIVATE HEALTH INSURANCE

## 2021-11-21 ENCOUNTER — Encounter: Admit: 2021-11-21 | Discharge: 2021-11-22 | Payer: PRIVATE HEALTH INSURANCE | Attending: Family | Primary: Family

## 2021-11-21 ENCOUNTER — Institutional Professional Consult (permissible substitution): Admit: 2021-11-21 | Discharge: 2021-11-22 | Payer: PRIVATE HEALTH INSURANCE

## 2021-11-21 ENCOUNTER — Ambulatory Visit: Admit: 2021-11-21 | Discharge: 2021-11-22 | Payer: PRIVATE HEALTH INSURANCE | Attending: Family | Primary: Family

## 2021-11-21 DIAGNOSIS — M8580 Other specified disorders of bone density and structure, unspecified site: Principal | ICD-10-CM

## 2021-11-21 DIAGNOSIS — Z942 Lung transplant status: Principal | ICD-10-CM

## 2021-11-21 DIAGNOSIS — Q348 Other specified congenital malformations of respiratory system: Principal | ICD-10-CM

## 2021-11-21 MED ORDER — SODIUM CHLORIDE 3 % FOR NEBULIZATION
Freq: Four times a day (QID) | RESPIRATORY_TRACT | 12 refills | 47 days | Status: CP | PRN
Start: 2021-11-21 — End: 2022-11-21

## 2021-11-21 MED ORDER — POTASSIUM CHLORIDE ER 10 MEQ TABLET,EXTENDED RELEASE(PART/CRYST)
ORAL_TABLET | Freq: Every day | ORAL | 11 refills | 15.00000 days | Status: CP
Start: 2021-11-21 — End: 2021-11-21
  Filled 2021-11-29: qty 60, 30d supply, fill #0

## 2021-11-24 DIAGNOSIS — Q348 Other specified congenital malformations of respiratory system: Principal | ICD-10-CM

## 2021-11-24 DIAGNOSIS — Z942 Lung transplant status: Principal | ICD-10-CM

## 2021-11-24 DIAGNOSIS — A498 Other bacterial infections of unspecified site: Principal | ICD-10-CM

## 2021-11-24 MED ORDER — CAYSTON 75 MG/ML SOLUTION FOR NEBULIZATION
0 refills | 0 days | Status: CP
Start: 2021-11-24 — End: ?

## 2021-11-28 ENCOUNTER — Ambulatory Visit: Admit: 2021-11-28 | Payer: PRIVATE HEALTH INSURANCE

## 2021-11-28 DIAGNOSIS — Z942 Lung transplant status: Principal | ICD-10-CM

## 2021-11-29 DIAGNOSIS — Z942 Lung transplant status: Principal | ICD-10-CM

## 2021-11-29 MED FILL — ATORVASTATIN 20 MG TABLET: ORAL | 30 days supply | Qty: 30 | Fill #4

## 2021-11-29 MED FILL — BD LUER-LOK SYRINGE 3 ML 21 GAUGE X 1": 90 days supply | Qty: 90 | Fill #0

## 2021-11-29 MED FILL — ALBUTEROL SULFATE 2.5 MG/3 ML (0.083 %) SOLUTION FOR NEBULIZATION: RESPIRATORY_TRACT | 19 days supply | Qty: 225 | Fill #1

## 2021-11-29 MED FILL — ALLOPURINOL 100 MG TABLET: ORAL | 90 days supply | Qty: 90 | Fill #3

## 2021-11-29 MED FILL — SYMBICORT 160 MCG-4.5 MCG/ACTUATION HFA AEROSOL INHALER: RESPIRATORY_TRACT | 30 days supply | Qty: 10.2 | Fill #6

## 2021-11-30 MED ORDER — FUROSEMIDE 40 MG TABLET
ORAL_TABLET | Freq: Two times a day (BID) | ORAL | 0 refills | 30 days | Status: CP
Start: 2021-11-30 — End: 2021-12-30
  Filled 2021-12-07: qty 60, 30d supply, fill #0

## 2021-12-01 ENCOUNTER — Encounter: Admit: 2021-12-01 | Discharge: 2021-12-01 | Payer: PRIVATE HEALTH INSURANCE | Attending: Family | Primary: Family

## 2021-12-01 ENCOUNTER — Institutional Professional Consult (permissible substitution): Admit: 2021-12-01 | Discharge: 2021-12-01 | Payer: PRIVATE HEALTH INSURANCE

## 2021-12-07 MED FILL — PANTOPRAZOLE 40 MG TABLET,DELAYED RELEASE: ORAL | 90 days supply | Qty: 180 | Fill #2

## 2021-12-07 MED FILL — AMIKACIN 500 MG/2 ML INJECTION SOLUTION: RESPIRATORY_TRACT | 30 days supply | Qty: 120 | Fill #3

## 2021-12-07 MED FILL — TACROLIMUS 1 MG CAPSULE, IMMEDIATE-RELEASE: ORAL | 30 days supply | Qty: 30 | Fill #1

## 2021-12-18 DIAGNOSIS — Z942 Lung transplant status: Principal | ICD-10-CM

## 2021-12-19 ENCOUNTER — Ambulatory Visit
Admit: 2021-12-19 | Discharge: 2021-12-24 | Disposition: A | Discharge status: Home / Self Care | Payer: PRIVATE HEALTH INSURANCE

## 2021-12-19 ENCOUNTER — Institutional Professional Consult (permissible substitution): Admit: 2021-12-19 | Discharge: 2021-12-20 | Payer: PRIVATE HEALTH INSURANCE

## 2021-12-19 ENCOUNTER — Ambulatory Visit: Admit: 2021-12-19 | Discharge: 2021-12-24 | Payer: PRIVATE HEALTH INSURANCE

## 2021-12-19 ENCOUNTER — Encounter: Admit: 2021-12-19 | Discharge: 2021-12-20 | Payer: PRIVATE HEALTH INSURANCE | Attending: Family | Primary: Family

## 2021-12-19 ENCOUNTER — Ambulatory Visit: Admit: 2021-12-19 | Discharge: 2021-12-20 | Payer: PRIVATE HEALTH INSURANCE

## 2021-12-19 ENCOUNTER — Ambulatory Visit: Admit: 2021-12-19 | Discharge: 2021-12-20 | Payer: PRIVATE HEALTH INSURANCE | Attending: Family | Primary: Family

## 2021-12-19 DIAGNOSIS — Z942 Lung transplant status: Principal | ICD-10-CM

## 2021-12-19 MED FILL — TACROLIMUS 5 MG CAPSULE, IMMEDIATE-RELEASE: ORAL | 30 days supply | Qty: 60 | Fill #1

## 2021-12-19 MED FILL — ALBUTEROL SULFATE HFA 90 MCG/ACTUATION AEROSOL INHALER: 25 days supply | Qty: 8.5 | Fill #8

## 2021-12-19 MED FILL — VALGANCICLOVIR 450 MG TABLET: ORAL | 90 days supply | Qty: 90 | Fill #1

## 2021-12-24 MED ORDER — MYCOPHENOLATE MOFETIL 250 MG CAPSULE
ORAL_CAPSULE | Freq: Every day | ORAL | 0 refills | 90 days | Status: CP
Start: 2021-12-24 — End: 2022-12-24
  Filled 2022-01-02: qty 30, 30d supply, fill #0

## 2021-12-24 MED ORDER — FUROSEMIDE 40 MG TABLET
ORAL_TABLET | Freq: Two times a day (BID) | ORAL | 0 refills | 30.00000 days | Status: CP
Start: 2021-12-24 — End: 2022-01-23
  Filled 2022-01-01: qty 60, 30d supply, fill #0

## 2021-12-25 DIAGNOSIS — Z9189 Other specified personal risk factors, not elsewhere classified: Principal | ICD-10-CM

## 2021-12-25 DIAGNOSIS — Z942 Lung transplant status: Principal | ICD-10-CM

## 2021-12-26 ENCOUNTER — Ambulatory Visit
Admit: 2021-12-26 | Discharge: 2022-01-01 | Disposition: A | Discharge status: Home / Self Care | Payer: PRIVATE HEALTH INSURANCE | Admitting: Infectious Disease

## 2021-12-26 ENCOUNTER — Ambulatory Visit: Admit: 2021-12-26 | Discharge: 2022-01-01 | Payer: PRIVATE HEALTH INSURANCE

## 2021-12-26 DIAGNOSIS — Z942 Lung transplant status: Principal | ICD-10-CM

## 2021-12-27 MED ORDER — VORICONAZOLE 200 MG TABLET
ORAL_TABLET | Freq: Two times a day (BID) | ORAL | 0 refills | 30.00000 days | Status: CP
Start: 2021-12-27 — End: 2021-12-27

## 2022-01-01 MED ORDER — LOKELMA 10 GRAM ORAL POWDER PACKET
PACK | Freq: Every day | ORAL | 0 refills | 21 days
Start: 2022-01-01 — End: 2022-01-01

## 2022-01-01 MED ORDER — SULFAMETHOXAZOLE 800 MG-TRIMETHOPRIM 160 MG TABLET
ORAL_TABLET | ORAL | 0 refills | 107 days | Status: CP
Start: 2022-01-01 — End: 2022-04-18
  Filled 2022-01-01: qty 175, 90d supply, fill #0

## 2022-01-01 MED FILL — LOKELMA 10 GRAM ORAL POWDER PACKET: ORAL | 30 days supply | Qty: 30 | Fill #0

## 2022-01-02 DIAGNOSIS — A498 Other bacterial infections of unspecified site: Principal | ICD-10-CM

## 2022-01-02 DIAGNOSIS — Z942 Lung transplant status: Principal | ICD-10-CM

## 2022-01-02 DIAGNOSIS — B488 Other specified mycoses: Principal | ICD-10-CM

## 2022-01-02 MED ORDER — TACROLIMUS 0.5 MG CAPSULE, IMMEDIATE-RELEASE
ORAL_CAPSULE | Freq: Every day | ORAL | 11 refills | 30 days | Status: CP
Start: 2022-01-02 — End: 2023-01-02
  Filled 2022-01-01: qty 30, 30d supply, fill #0

## 2022-01-02 MED ORDER — VORICONAZOLE 200 MG TABLET
ORAL_TABLET | Freq: Two times a day (BID) | ORAL | 0 refills | 30 days | Status: CP
Start: 2022-01-02 — End: ?
  Filled 2022-01-01: qty 60, 30d supply, fill #0

## 2022-01-02 MED ORDER — SODIUM ZIRCONIUM CYCLOSILICATE 10 GRAM ORAL POWDER PACKET
PACK | Freq: Every day | ORAL | 2 refills | 30 days | Status: CP
Start: 2022-01-02 — End: 2022-04-02

## 2022-01-02 MED ORDER — VORICONAZOLE 50 MG TABLET
ORAL_TABLET | Freq: Two times a day (BID) | ORAL | 0 refills | 30 days | Status: CP
Start: 2022-01-02 — End: 2022-02-01
  Filled 2022-01-02: qty 60, 30d supply, fill #0

## 2022-01-02 MED FILL — SYMBICORT 160 MCG-4.5 MCG/ACTUATION HFA AEROSOL INHALER: RESPIRATORY_TRACT | 30 days supply | Qty: 10.2 | Fill #7

## 2022-01-02 MED FILL — PREDNISONE 2.5 MG TABLET: ORAL | 90 days supply | Qty: 90 | Fill #1

## 2022-01-02 MED FILL — PREDNISONE 5 MG TABLET: 90 days supply | Qty: 90 | Fill #2

## 2022-01-02 MED FILL — ATORVASTATIN 20 MG TABLET: ORAL | 30 days supply | Qty: 30 | Fill #5

## 2022-01-02 MED FILL — ALBUTEROL SULFATE 2.5 MG/3 ML (0.083 %) SOLUTION FOR NEBULIZATION: RESPIRATORY_TRACT | 19 days supply | Qty: 225 | Fill #2

## 2022-01-03 ENCOUNTER — Ambulatory Visit: Admit: 2022-01-03 | Discharge: 2022-01-04 | Payer: PRIVATE HEALTH INSURANCE

## 2022-01-03 DIAGNOSIS — Z942 Lung transplant status: Principal | ICD-10-CM

## 2022-01-03 DIAGNOSIS — K21 Gastroesophageal reflux disease with esophagitis without hemorrhage: Principal | ICD-10-CM

## 2022-01-03 MED ORDER — GAVISCON 95 MG-358 MG/15 ML ORAL SUSPENSION
Freq: Four times a day (QID) | ORAL | 11 refills | 30 days | Status: CP
Start: 2022-01-03 — End: 2023-01-03

## 2022-01-06 ENCOUNTER — Ambulatory Visit: Admit: 2022-01-06 | Discharge: 2022-01-07 | Payer: PRIVATE HEALTH INSURANCE

## 2022-01-07 DIAGNOSIS — Z942 Lung transplant status: Principal | ICD-10-CM

## 2022-01-07 MED ORDER — TACROLIMUS 0.5 MG CAPSULE, IMMEDIATE-RELEASE
ORAL_CAPSULE | Freq: Two times a day (BID) | ORAL | 11 refills | 30 days | Status: CP
Start: 2022-01-07 — End: 2023-01-07

## 2022-01-08 DIAGNOSIS — A498 Other bacterial infections of unspecified site: Principal | ICD-10-CM

## 2022-01-08 DIAGNOSIS — Z942 Lung transplant status: Principal | ICD-10-CM

## 2022-01-08 DIAGNOSIS — Q348 Other specified congenital malformations of respiratory system: Principal | ICD-10-CM

## 2022-01-08 MED ORDER — CAYSTON 75 MG/ML SOLUTION FOR NEBULIZATION
0 refills | 0 days | Status: CP
Start: 2022-01-08 — End: ?

## 2022-01-10 ENCOUNTER — Ambulatory Visit: Admit: 2022-01-10 | Discharge: 2022-01-11 | Payer: PRIVATE HEALTH INSURANCE

## 2022-01-12 ENCOUNTER — Ambulatory Visit: Admit: 2022-01-12 | Discharge: 2022-01-13 | Payer: PRIVATE HEALTH INSURANCE

## 2022-01-12 ENCOUNTER — Encounter: Admit: 2022-01-12 | Discharge: 2022-01-13 | Payer: PRIVATE HEALTH INSURANCE | Attending: Family | Primary: Family

## 2022-01-12 ENCOUNTER — Other Ambulatory Visit: Admit: 2022-01-12 | Discharge: 2022-01-13 | Payer: PRIVATE HEALTH INSURANCE

## 2022-01-12 ENCOUNTER — Ambulatory Visit: Admit: 2022-01-12 | Discharge: 2022-01-13 | Payer: PRIVATE HEALTH INSURANCE | Attending: Family | Primary: Family

## 2022-01-12 ENCOUNTER — Ambulatory Visit
Admit: 2022-01-12 | Discharge: 2022-01-13 | Payer: PRIVATE HEALTH INSURANCE | Attending: Infectious Disease | Primary: Infectious Disease

## 2022-01-12 DIAGNOSIS — Z942 Lung transplant status: Principal | ICD-10-CM

## 2022-01-12 DIAGNOSIS — B59 Pneumocystosis: Principal | ICD-10-CM

## 2022-01-12 DIAGNOSIS — B488 Other specified mycoses: Principal | ICD-10-CM

## 2022-01-12 DIAGNOSIS — R238 Other skin changes: Principal | ICD-10-CM

## 2022-01-12 MED ORDER — VORICONAZOLE 200 MG TABLET
ORAL_TABLET | Freq: Two times a day (BID) | ORAL | 0 refills | 30 days | Status: CP
Start: 2022-01-12 — End: ?

## 2022-01-12 MED ORDER — VORICONAZOLE 50 MG TABLET
ORAL_TABLET | Freq: Two times a day (BID) | ORAL | 0 refills | 30 days | Status: CP
Start: 2022-01-12 — End: 2022-02-11

## 2022-01-17 ENCOUNTER — Ambulatory Visit: Admit: 2022-01-17 | Discharge: 2022-01-17 | Payer: PRIVATE HEALTH INSURANCE

## 2022-01-17 ENCOUNTER — Encounter: Admit: 2022-01-17 | Discharge: 2022-01-17 | Payer: PRIVATE HEALTH INSURANCE | Attending: Family | Primary: Family

## 2022-01-17 DIAGNOSIS — B59 Pneumocystosis: Principal | ICD-10-CM

## 2022-01-17 DIAGNOSIS — Z942 Lung transplant status: Principal | ICD-10-CM

## 2022-01-17 MED ORDER — TACROLIMUS 0.5 MG CAPSULE, IMMEDIATE-RELEASE
ORAL_CAPSULE | ORAL | 11 refills | 30 days | Status: CP
Start: 2022-01-17 — End: 2023-01-17
  Filled 2022-01-17: qty 60, 30d supply, fill #0

## 2022-01-17 MED ORDER — SULFAMETHOXAZOLE 800 MG-TRIMETHOPRIM 160 MG TABLET
ORAL_TABLET | Freq: Every day | ORAL | 3 refills | 90 days | Status: CP
Start: 2022-01-17 — End: 2023-01-17
  Filled 2022-03-09: qty 90, 90d supply, fill #0

## 2022-01-17 MED ORDER — VALSARTAN 40 MG TABLET
ORAL_TABLET | Freq: Every day | ORAL | 11 refills | 30 days | Status: CP
Start: 2022-01-17 — End: 2023-01-17
  Filled 2022-01-22: qty 30, 30d supply, fill #0

## 2022-01-22 ENCOUNTER — Ambulatory Visit: Admit: 2022-01-22 | Discharge: 2022-01-23 | Payer: PRIVATE HEALTH INSURANCE

## 2022-01-22 ENCOUNTER — Ambulatory Visit: Admit: 2022-01-22 | Discharge: 2022-01-22 | Payer: PRIVATE HEALTH INSURANCE

## 2022-01-22 ENCOUNTER — Institutional Professional Consult (permissible substitution): Admit: 2022-01-22 | Discharge: 2022-01-22 | Payer: PRIVATE HEALTH INSURANCE

## 2022-01-22 ENCOUNTER — Encounter: Admit: 2022-01-22 | Discharge: 2022-01-22 | Payer: PRIVATE HEALTH INSURANCE | Attending: Family | Primary: Family

## 2022-01-22 DIAGNOSIS — L03116 Cellulitis of left lower limb: Principal | ICD-10-CM

## 2022-01-22 DIAGNOSIS — L02416 Cutaneous abscess of left lower limb: Principal | ICD-10-CM

## 2022-01-22 DIAGNOSIS — Z942 Lung transplant status: Principal | ICD-10-CM

## 2022-01-22 DIAGNOSIS — Q348 Other specified congenital malformations of respiratory system: Principal | ICD-10-CM

## 2022-01-22 MED ORDER — TACROLIMUS 0.5 MG CAPSULE, IMMEDIATE-RELEASE
ORAL_CAPSULE | Freq: Two times a day (BID) | ORAL | 3 refills | 90 days | Status: CP
Start: 2022-01-22 — End: 2023-01-22
  Filled 2022-02-03: qty 120, 30d supply, fill #0

## 2022-01-22 MED ORDER — AMOXICILLIN 875 MG-POTASSIUM CLAVULANATE 125 MG TABLET
ORAL_TABLET | Freq: Two times a day (BID) | ORAL | 0 refills | 14 days | Status: CP
Start: 2022-01-22 — End: 2022-02-05
  Filled 2022-01-22: qty 28, 14d supply, fill #0

## 2022-01-23 DIAGNOSIS — B488 Other specified mycoses: Principal | ICD-10-CM

## 2022-01-23 MED ORDER — VORICONAZOLE 50 MG TABLET
ORAL_TABLET | Freq: Two times a day (BID) | ORAL | 0 refills | 30 days | Status: CP
Start: 2022-01-23 — End: 2022-02-22
  Filled 2022-01-22: qty 120, 30d supply, fill #0
  Filled 2022-02-01: qty 180, 90d supply, fill #0

## 2022-01-23 MED ORDER — VORICONAZOLE 200 MG TABLET
ORAL_TABLET | Freq: Two times a day (BID) | ORAL | 3 refills | 90 days | Status: CP
Start: 2022-01-23 — End: 2023-01-23

## 2022-01-30 ENCOUNTER — Encounter: Admit: 2022-01-30 | Discharge: 2022-01-30 | Payer: PRIVATE HEALTH INSURANCE | Attending: Family | Primary: Family

## 2022-01-30 ENCOUNTER — Ambulatory Visit: Admit: 2022-01-30 | Discharge: 2022-01-30 | Payer: PRIVATE HEALTH INSURANCE

## 2022-01-30 DIAGNOSIS — Z942 Lung transplant status: Principal | ICD-10-CM

## 2022-02-01 MED FILL — ALBUTEROL SULFATE 2.5 MG/3 ML (0.083 %) SOLUTION FOR NEBULIZATION: RESPIRATORY_TRACT | 19 days supply | Qty: 225 | Fill #3

## 2022-02-03 MED ORDER — MONTELUKAST 10 MG TABLET
ORAL_TABLET | Freq: Every evening | ORAL | 3 refills | 90 days | Status: CP
Start: 2022-02-03 — End: 2023-02-03
  Filled 2022-02-09: qty 90, 90d supply, fill #0

## 2022-02-03 MED FILL — AZITHROMYCIN 250 MG TABLET: ORAL | 90 days supply | Qty: 90 | Fill #2

## 2022-02-03 MED FILL — ATORVASTATIN 20 MG TABLET: ORAL | 30 days supply | Qty: 30 | Fill #6

## 2022-02-03 MED FILL — MYCOPHENOLATE MOFETIL 250 MG CAPSULE: ORAL | 30 days supply | Qty: 30 | Fill #1

## 2022-02-06 ENCOUNTER — Ambulatory Visit: Admit: 2022-02-06 | Discharge: 2022-02-06 | Payer: PRIVATE HEALTH INSURANCE

## 2022-02-06 ENCOUNTER — Encounter: Admit: 2022-02-06 | Discharge: 2022-02-06 | Payer: PRIVATE HEALTH INSURANCE | Attending: Family | Primary: Family

## 2022-02-07 MED ORDER — FUROSEMIDE 40 MG TABLET
ORAL_TABLET | Freq: Two times a day (BID) | ORAL | 5 refills | 30 days | Status: CP
Start: 2022-02-07 — End: 2022-08-06
  Filled 2022-02-09: qty 60, 30d supply, fill #0

## 2022-02-09 DIAGNOSIS — B488 Other specified mycoses: Principal | ICD-10-CM

## 2022-02-09 MED FILL — COLCHICINE 0.6 MG TABLET: ORAL | 30 days supply | Qty: 30 | Fill #4

## 2022-02-09 MED FILL — AMIKACIN 500 MG/2 ML INJECTION SOLUTION: RESPIRATORY_TRACT | 10 days supply | Qty: 40 | Fill #4

## 2022-02-09 MED FILL — VORICONAZOLE 50 MG TABLET: ORAL | 12 days supply | Qty: 73 | Fill #0

## 2022-02-09 MED FILL — SYMBICORT 160 MCG-4.5 MCG/ACTUATION HFA AEROSOL INHALER: RESPIRATORY_TRACT | 30 days supply | Qty: 10.2 | Fill #8

## 2022-02-15 ENCOUNTER — Ambulatory Visit: Admit: 2022-02-15 | Discharge: 2022-02-16 | Payer: PRIVATE HEALTH INSURANCE

## 2022-02-15 DIAGNOSIS — J069 Acute upper respiratory infection, unspecified: Principal | ICD-10-CM

## 2022-02-20 ENCOUNTER — Institutional Professional Consult (permissible substitution): Admit: 2022-02-20 | Discharge: 2022-02-21 | Payer: PRIVATE HEALTH INSURANCE

## 2022-02-20 DIAGNOSIS — Q348 Other specified congenital malformations of respiratory system: Principal | ICD-10-CM

## 2022-02-23 MED ORDER — ALLOPURINOL 100 MG TABLET
ORAL_TABLET | Freq: Every day | ORAL | 3 refills | 90 days | Status: CP
Start: 2022-02-23 — End: 2023-02-23
  Filled 2022-02-23: qty 90, 90d supply, fill #0

## 2022-02-23 MED ORDER — ALBUTEROL SULFATE 2.5 MG/3 ML (0.083 %) SOLUTION FOR NEBULIZATION
RESPIRATORY_TRACT | 3 refills | 13 days | Status: CP | PRN
Start: 2022-02-23 — End: 2023-02-23
  Filled 2022-02-23: qty 225, 19d supply, fill #0

## 2022-02-23 MED FILL — AMIKACIN 500 MG/2 ML INJECTION SOLUTION: RESPIRATORY_TRACT | 10 days supply | Qty: 40 | Fill #5

## 2022-02-23 MED FILL — VORICONAZOLE 50 MG TABLET: ORAL | 18 days supply | Qty: 107 | Fill #1

## 2022-02-23 MED FILL — BD LUER-LOK SYRINGE 3 ML 21 GAUGE X 1": 9 days supply | Qty: 18 | Fill #2

## 2022-02-27 DIAGNOSIS — Q348 Other specified congenital malformations of respiratory system: Principal | ICD-10-CM

## 2022-02-27 DIAGNOSIS — A498 Other bacterial infections of unspecified site: Principal | ICD-10-CM

## 2022-02-27 DIAGNOSIS — Z942 Lung transplant status: Principal | ICD-10-CM

## 2022-02-27 MED ORDER — CAYSTON 75 MG/ML SOLUTION FOR NEBULIZATION
4 refills | 0 days | Status: CP
Start: 2022-02-27 — End: ?

## 2022-03-09 ENCOUNTER — Ambulatory Visit: Admit: 2022-03-09 | Discharge: 2022-03-09 | Payer: PRIVATE HEALTH INSURANCE

## 2022-03-09 DIAGNOSIS — Q348 Other specified congenital malformations of respiratory system: Principal | ICD-10-CM

## 2022-03-09 DIAGNOSIS — Z942 Lung transplant status: Principal | ICD-10-CM

## 2022-03-09 DIAGNOSIS — D801 Nonfamilial hypogammaglobulinemia: Principal | ICD-10-CM

## 2022-03-09 MED FILL — BUDESONIDE-FORMOTEROL HFA 160 MCG-4.5 MCG/ACTUATION AEROSOL INHALER: RESPIRATORY_TRACT | 30 days supply | Qty: 10.2 | Fill #9

## 2022-03-09 MED FILL — COLCHICINE 0.6 MG TABLET: ORAL | 30 days supply | Qty: 30 | Fill #5

## 2022-03-09 MED FILL — FUROSEMIDE 40 MG TABLET: ORAL | 30 days supply | Qty: 60 | Fill #1

## 2022-03-09 MED FILL — VALSARTAN 40 MG TABLET: ORAL | 30 days supply | Qty: 30 | Fill #1

## 2022-03-09 MED FILL — PANTOPRAZOLE 40 MG TABLET,DELAYED RELEASE: ORAL | 90 days supply | Qty: 180 | Fill #3

## 2022-03-09 MED FILL — AMIKACIN 500 MG/2 ML INJECTION SOLUTION: RESPIRATORY_TRACT | 10 days supply | Qty: 40 | Fill #6

## 2022-03-09 MED FILL — ATORVASTATIN 20 MG TABLET: ORAL | 30 days supply | Qty: 30 | Fill #7

## 2022-03-09 MED FILL — BD LUER-LOK SYRINGE 3 ML 25 X 5/8": 50 days supply | Qty: 100 | Fill #0

## 2022-03-09 MED FILL — MYCOPHENOLATE MOFETIL 250 MG CAPSULE: ORAL | 30 days supply | Qty: 30 | Fill #2

## 2022-03-09 MED FILL — TACROLIMUS 0.5 MG CAPSULE, IMMEDIATE-RELEASE: ORAL | 30 days supply | Qty: 120 | Fill #1

## 2022-03-13 DIAGNOSIS — B488 Other specified mycoses: Principal | ICD-10-CM

## 2022-03-14 MED ORDER — VORICONAZOLE 50 MG TABLET
ORAL_TABLET | Freq: Two times a day (BID) | ORAL | 0 refills | 30 days | Status: CP
Start: 2022-03-14 — End: 2022-04-13
  Filled 2022-03-16: qty 180, 30d supply, fill #0

## 2022-03-16 MED FILL — VALGANCICLOVIR 450 MG TABLET: ORAL | 90 days supply | Qty: 90 | Fill #2

## 2022-03-22 DIAGNOSIS — Z942 Lung transplant status: Principal | ICD-10-CM

## 2022-04-02 DIAGNOSIS — D801 Nonfamilial hypogammaglobulinemia: Principal | ICD-10-CM

## 2022-04-02 DIAGNOSIS — Z942 Lung transplant status: Principal | ICD-10-CM

## 2022-04-04 DIAGNOSIS — Z942 Lung transplant status: Principal | ICD-10-CM

## 2022-04-05 DIAGNOSIS — Z942 Lung transplant status: Principal | ICD-10-CM

## 2022-04-05 DIAGNOSIS — D801 Nonfamilial hypogammaglobulinemia: Principal | ICD-10-CM

## 2022-04-06 ENCOUNTER — Ambulatory Visit: Admit: 2022-04-06 | Discharge: 2022-04-06 | Payer: PRIVATE HEALTH INSURANCE

## 2022-04-06 ENCOUNTER — Encounter: Admit: 2022-04-06 | Discharge: 2022-04-06 | Payer: PRIVATE HEALTH INSURANCE | Attending: Family | Primary: Family

## 2022-04-10 DIAGNOSIS — B488 Other specified mycoses: Principal | ICD-10-CM

## 2022-04-10 DIAGNOSIS — Z942 Lung transplant status: Principal | ICD-10-CM

## 2022-04-10 MED ORDER — VORICONAZOLE 50 MG TABLET
ORAL_TABLET | Freq: Two times a day (BID) | ORAL | 0 refills | 30 days | Status: CP
Start: 2022-04-10 — End: 2022-05-10
  Filled 2022-04-11: qty 180, 30d supply, fill #0

## 2022-04-11 MED ORDER — MYCOPHENOLATE MOFETIL 250 MG CAPSULE
ORAL_CAPSULE | Freq: Every day | ORAL | 0 refills | 90 days | Status: CP
Start: 2022-04-11 — End: 2023-04-11
  Filled 2022-05-09: qty 30, 30d supply, fill #0

## 2022-04-11 MED FILL — BUDESONIDE-FORMOTEROL HFA 160 MCG-4.5 MCG/ACTUATION AEROSOL INHALER: RESPIRATORY_TRACT | 30 days supply | Qty: 10.2 | Fill #10

## 2022-04-11 MED FILL — TACROLIMUS 0.5 MG CAPSULE, IMMEDIATE-RELEASE: ORAL | 30 days supply | Qty: 120 | Fill #2

## 2022-04-11 MED FILL — PREDNISONE 2.5 MG TABLET: ORAL | 90 days supply | Qty: 90 | Fill #2

## 2022-04-11 MED FILL — PREDNISONE 5 MG TABLET: 90 days supply | Qty: 90 | Fill #3

## 2022-04-11 MED FILL — VALSARTAN 40 MG TABLET: ORAL | 30 days supply | Qty: 30 | Fill #2

## 2022-04-11 MED FILL — ATORVASTATIN 20 MG TABLET: ORAL | 30 days supply | Qty: 30 | Fill #8

## 2022-04-11 MED FILL — FUROSEMIDE 40 MG TABLET: ORAL | 30 days supply | Qty: 60 | Fill #2

## 2022-04-11 MED FILL — ALBUTEROL SULFATE 2.5 MG/3 ML (0.083 %) SOLUTION FOR NEBULIZATION: RESPIRATORY_TRACT | 19 days supply | Qty: 225 | Fill #1

## 2022-04-13 MED ORDER — ALBUTEROL SULFATE HFA 90 MCG/ACTUATION AEROSOL INHALER
Freq: Four times a day (QID) | RESPIRATORY_TRACT | 11 refills | 0 days | Status: CP | PRN
Start: 2022-04-13 — End: 2023-04-13

## 2022-04-16 DIAGNOSIS — Z942 Lung transplant status: Principal | ICD-10-CM

## 2022-04-16 DIAGNOSIS — Q348 Other specified congenital malformations of respiratory system: Principal | ICD-10-CM

## 2022-04-16 DIAGNOSIS — A498 Other bacterial infections of unspecified site: Principal | ICD-10-CM

## 2022-04-16 MED ORDER — CAYSTON 75 MG/ML SOLUTION FOR NEBULIZATION
4 refills | 0 days | Status: CP
Start: 2022-04-16 — End: ?

## 2022-05-02 DIAGNOSIS — Z942 Lung transplant status: Principal | ICD-10-CM

## 2022-05-02 MED FILL — VORICONAZOLE 200 MG TABLET: ORAL | 90 days supply | Qty: 180 | Fill #1

## 2022-05-03 DIAGNOSIS — B488 Other specified mycoses: Principal | ICD-10-CM

## 2022-05-04 MED ORDER — BUDESONIDE-FORMOTEROL HFA 160 MCG-4.5 MCG/ACTUATION AEROSOL INHALER
Freq: Two times a day (BID) | RESPIRATORY_TRACT | 11 refills | 31 days | Status: CP
Start: 2022-05-04 — End: 2023-05-04

## 2022-05-04 MED ORDER — ALBUTEROL SULFATE 2.5 MG/3 ML (0.083 %) SOLUTION FOR NEBULIZATION
RESPIRATORY_TRACT | 3 refills | 13 days | Status: CP | PRN
Start: 2022-05-04 — End: 2023-05-04
  Filled 2022-05-09: qty 225, 19d supply, fill #0

## 2022-05-04 MED ORDER — VORICONAZOLE 50 MG TABLET
ORAL_TABLET | Freq: Two times a day (BID) | ORAL | 0 refills | 30 days | Status: CP
Start: 2022-05-04 — End: 2022-06-03
  Filled 2022-05-09: qty 180, 30d supply, fill #0

## 2022-05-09 ENCOUNTER — Institutional Professional Consult (permissible substitution): Admit: 2022-05-09 | Discharge: 2022-05-10 | Payer: PRIVATE HEALTH INSURANCE

## 2022-05-09 DIAGNOSIS — M25549 Pain in joints of unspecified hand: Principal | ICD-10-CM

## 2022-05-09 MED FILL — ATORVASTATIN 20 MG TABLET: ORAL | 30 days supply | Qty: 30 | Fill #9

## 2022-05-09 MED FILL — AMIKACIN 500 MG/2 ML INJECTION SOLUTION: RESPIRATORY_TRACT | 30 days supply | Qty: 120 | Fill #7

## 2022-05-09 MED FILL — BUDESONIDE-FORMOTEROL HFA 160 MCG-4.5 MCG/ACTUATION AEROSOL INHALER: RESPIRATORY_TRACT | 30 days supply | Qty: 10.2 | Fill #0

## 2022-05-09 MED FILL — VALSARTAN 40 MG TABLET: ORAL | 30 days supply | Qty: 30 | Fill #3

## 2022-05-09 MED FILL — ALBUTEROL SULFATE HFA 90 MCG/ACTUATION AEROSOL INHALER: RESPIRATORY_TRACT | 25 days supply | Qty: 8.5 | Fill #0

## 2022-05-09 MED FILL — TACROLIMUS 0.5 MG CAPSULE, IMMEDIATE-RELEASE: ORAL | 30 days supply | Qty: 120 | Fill #3

## 2022-05-09 MED FILL — FUROSEMIDE 40 MG TABLET: ORAL | 30 days supply | Qty: 60 | Fill #3

## 2022-05-16 DIAGNOSIS — B49 Unspecified mycosis: Principal | ICD-10-CM

## 2022-05-16 DIAGNOSIS — J329 Chronic sinusitis, unspecified: Principal | ICD-10-CM

## 2022-05-22 DIAGNOSIS — Z942 Lung transplant status: Principal | ICD-10-CM

## 2022-05-23 ENCOUNTER — Ambulatory Visit: Admit: 2022-05-23 | Discharge: 2022-05-24 | Payer: PRIVATE HEALTH INSURANCE

## 2022-05-25 ENCOUNTER — Ambulatory Visit: Admit: 2022-05-25 | Discharge: 2022-05-26 | Payer: PRIVATE HEALTH INSURANCE

## 2022-05-25 ENCOUNTER — Ambulatory Visit: Admit: 2022-05-25 | Discharge: 2022-05-26 | Payer: PRIVATE HEALTH INSURANCE | Attending: Family | Primary: Family

## 2022-05-25 ENCOUNTER — Other Ambulatory Visit: Admit: 2022-05-25 | Discharge: 2022-05-26 | Payer: PRIVATE HEALTH INSURANCE

## 2022-05-25 ENCOUNTER — Ambulatory Visit
Admit: 2022-05-25 | Discharge: 2022-05-26 | Payer: PRIVATE HEALTH INSURANCE | Attending: Infectious Disease | Primary: Infectious Disease

## 2022-05-25 DIAGNOSIS — R7303 Prediabetes: Principal | ICD-10-CM

## 2022-05-25 DIAGNOSIS — B59 Pneumocystosis: Principal | ICD-10-CM

## 2022-05-25 DIAGNOSIS — Z942 Lung transplant status: Principal | ICD-10-CM

## 2022-05-25 DIAGNOSIS — R21 Rash and other nonspecific skin eruption: Principal | ICD-10-CM

## 2022-05-25 DIAGNOSIS — M8580 Other specified disorders of bone density and structure, unspecified site: Principal | ICD-10-CM

## 2022-05-25 MED ORDER — TACROLIMUS 0.5 MG CAPSULE, IMMEDIATE-RELEASE
ORAL_CAPSULE | Freq: Two times a day (BID) | ORAL | 3 refills | 90 days | Status: CP
Start: 2022-05-25 — End: 2023-05-25

## 2022-05-25 MED ORDER — SITAGLIPTIN PHOSPHATE 50 MG TABLET
ORAL_TABLET | Freq: Every day | ORAL | 3 refills | 90 days | Status: CP
Start: 2022-05-25 — End: 2023-05-25
  Filled 2022-06-01: qty 30, 30d supply, fill #0

## 2022-05-25 MED ORDER — SULFAMETHOXAZOLE 400 MG-TRIMETHOPRIM 80 MG TABLET
ORAL_TABLET | ORAL | 3 refills | 84 days | Status: CP
Start: 2022-05-25 — End: 2023-05-25
  Filled 2022-05-25: qty 36, 84d supply, fill #0

## 2022-05-25 MED ORDER — PANTOPRAZOLE 40 MG TABLET,DELAYED RELEASE
ORAL_TABLET | Freq: Two times a day (BID) | ORAL | 3 refills | 90 days | Status: CP
Start: 2022-05-25 — End: 2023-05-25
  Filled 2022-05-25: qty 180, 90d supply, fill #0

## 2022-05-25 MED FILL — ALLOPURINOL 100 MG TABLET: ORAL | 90 days supply | Qty: 90 | Fill #1

## 2022-05-25 MED FILL — AZITHROMYCIN 250 MG TABLET: ORAL | 90 days supply | Qty: 90 | Fill #3

## 2022-05-25 MED FILL — BD LUER-LOK SYRINGE 3 ML 25 X 5/8": 41 days supply | Qty: 82 | Fill #1

## 2022-05-25 MED FILL — MONTELUKAST 10 MG TABLET: ORAL | 90 days supply | Qty: 90 | Fill #1

## 2022-05-28 DIAGNOSIS — Z942 Lung transplant status: Principal | ICD-10-CM

## 2022-05-29 ENCOUNTER — Ambulatory Visit: Admit: 2022-05-29 | Discharge: 2022-05-30 | Payer: PRIVATE HEALTH INSURANCE

## 2022-05-29 DIAGNOSIS — Z942 Lung transplant status: Principal | ICD-10-CM

## 2022-05-29 MED ORDER — TACROLIMUS 0.5 MG CAPSULE, IMMEDIATE-RELEASE
ORAL_CAPSULE | Freq: Two times a day (BID) | ORAL | 3 refills | 90 days | Status: CP
Start: 2022-05-29 — End: 2023-05-29

## 2022-06-01 ENCOUNTER — Ambulatory Visit: Admit: 2022-06-01 | Discharge: 2022-06-02 | Payer: PRIVATE HEALTH INSURANCE

## 2022-06-01 DIAGNOSIS — Z942 Lung transplant status: Principal | ICD-10-CM

## 2022-06-01 MED ORDER — TACROLIMUS 1 MG CAPSULE, IMMEDIATE-RELEASE
ORAL_CAPSULE | Freq: Two times a day (BID) | ORAL | 3 refills | 90 days | Status: CP
Start: 2022-06-01 — End: 2023-06-01
  Filled 2022-06-01: qty 300, 30d supply, fill #0

## 2022-06-05 ENCOUNTER — Ambulatory Visit: Admit: 2022-06-05 | Discharge: 2022-06-06 | Payer: PRIVATE HEALTH INSURANCE

## 2022-06-05 DIAGNOSIS — Z942 Lung transplant status: Principal | ICD-10-CM

## 2022-06-06 DIAGNOSIS — D649 Anemia, unspecified: Principal | ICD-10-CM

## 2022-06-06 DIAGNOSIS — D501 Sideropenic dysphagia: Principal | ICD-10-CM

## 2022-06-11 ENCOUNTER — Institutional Professional Consult (permissible substitution): Admit: 2022-06-11 | Discharge: 2022-06-12 | Payer: PRIVATE HEALTH INSURANCE

## 2022-06-11 DIAGNOSIS — Q348 Other specified congenital malformations of respiratory system: Principal | ICD-10-CM

## 2022-06-11 DIAGNOSIS — Z942 Lung transplant status: Principal | ICD-10-CM

## 2022-06-11 DIAGNOSIS — D649 Anemia, unspecified: Principal | ICD-10-CM

## 2022-06-11 MED ORDER — TACROLIMUS 1 MG CAPSULE, IMMEDIATE-RELEASE
ORAL_CAPSULE | ORAL | 3 refills | 90 days | Status: CP
Start: 2022-06-11 — End: 2023-06-11
  Filled 2022-06-29: qty 330, 30d supply, fill #0

## 2022-06-14 MED FILL — VALSARTAN 40 MG TABLET: ORAL | 30 days supply | Qty: 30 | Fill #4

## 2022-06-14 MED FILL — FUROSEMIDE 40 MG TABLET: ORAL | 30 days supply | Qty: 60 | Fill #4

## 2022-06-14 MED FILL — MYCOPHENOLATE MOFETIL 250 MG CAPSULE: ORAL | 30 days supply | Qty: 30 | Fill #1

## 2022-06-14 MED FILL — ALBUTEROL SULFATE HFA 90 MCG/ACTUATION AEROSOL INHALER: RESPIRATORY_TRACT | 25 days supply | Qty: 8.5 | Fill #1

## 2022-06-14 MED FILL — BUDESONIDE-FORMOTEROL HFA 160 MCG-4.5 MCG/ACTUATION AEROSOL INHALER: RESPIRATORY_TRACT | 30 days supply | Qty: 10.2 | Fill #1

## 2022-06-14 MED FILL — ALBUTEROL SULFATE 2.5 MG/3 ML (0.083 %) SOLUTION FOR NEBULIZATION: RESPIRATORY_TRACT | 19 days supply | Qty: 225 | Fill #1

## 2022-06-14 MED FILL — ATORVASTATIN 20 MG TABLET: ORAL | 30 days supply | Qty: 30 | Fill #10

## 2022-06-14 MED FILL — AMIKACIN 500 MG/2 ML INJECTION SOLUTION: RESPIRATORY_TRACT | 30 days supply | Qty: 120 | Fill #8

## 2022-06-22 ENCOUNTER — Ambulatory Visit: Admit: 2022-06-22 | Discharge: 2022-06-23 | Payer: PRIVATE HEALTH INSURANCE

## 2022-06-22 ENCOUNTER — Ambulatory Visit
Admit: 2022-06-22 | Discharge: 2022-06-23 | Payer: PRIVATE HEALTH INSURANCE | Attending: Physician Assistant | Primary: Physician Assistant

## 2022-06-22 DIAGNOSIS — Z942 Lung transplant status: Principal | ICD-10-CM

## 2022-06-22 DIAGNOSIS — M85859 Other specified disorders of bone density and structure, unspecified thigh: Principal | ICD-10-CM

## 2022-06-29 ENCOUNTER — Ambulatory Visit: Admit: 2022-06-29 | Discharge: 2022-06-30 | Payer: PRIVATE HEALTH INSURANCE

## 2022-06-29 ENCOUNTER — Ambulatory Visit
Admit: 2022-06-29 | Discharge: 2022-06-30 | Payer: PRIVATE HEALTH INSURANCE | Attending: Infectious Disease | Primary: Infectious Disease

## 2022-06-29 ENCOUNTER — Ambulatory Visit: Admit: 2022-06-29 | Discharge: 2022-06-30 | Payer: PRIVATE HEALTH INSURANCE | Attending: Family | Primary: Family

## 2022-06-29 ENCOUNTER — Encounter: Admit: 2022-06-29 | Discharge: 2022-06-30 | Payer: PRIVATE HEALTH INSURANCE | Attending: Family | Primary: Family

## 2022-06-29 MED ORDER — PREDNISONE 20 MG TABLET
ORAL_TABLET | Freq: Every day | ORAL | 0 refills | 10 days | Status: CP
Start: 2022-06-29 — End: 2022-07-09
  Filled 2022-06-29: qty 20, 10d supply, fill #0

## 2022-06-29 MED FILL — JANUVIA 50 MG TABLET: ORAL | 90 days supply | Qty: 90 | Fill #1

## 2022-07-05 MED ORDER — PREDNISONE 5 MG TABLET
ORAL_TABLET | 3 refills | 0 days | Status: CP
Start: 2022-07-05 — End: 2023-07-05
  Filled 2022-07-05: qty 90, 90d supply, fill #0

## 2022-07-05 MED FILL — ALBUTEROL SULFATE HFA 90 MCG/ACTUATION AEROSOL INHALER: RESPIRATORY_TRACT | 25 days supply | Qty: 8.5 | Fill #2

## 2022-07-05 MED FILL — VALSARTAN 40 MG TABLET: ORAL | 30 days supply | Qty: 30 | Fill #5

## 2022-07-05 MED FILL — FUROSEMIDE 40 MG TABLET: ORAL | 30 days supply | Qty: 60 | Fill #5

## 2022-07-05 MED FILL — PREDNISONE 2.5 MG TABLET: ORAL | 90 days supply | Qty: 90 | Fill #3

## 2022-07-05 MED FILL — VALGANCICLOVIR 450 MG TABLET: ORAL | 90 days supply | Qty: 90 | Fill #3

## 2022-07-05 MED FILL — MYCOPHENOLATE MOFETIL 250 MG CAPSULE: ORAL | 30 days supply | Qty: 30 | Fill #2

## 2022-07-05 MED FILL — ATORVASTATIN 20 MG TABLET: ORAL | 30 days supply | Qty: 30 | Fill #11

## 2022-07-10 ENCOUNTER — Ambulatory Visit: Admit: 2022-07-10 | Discharge: 2022-07-11 | Payer: PRIVATE HEALTH INSURANCE | Attending: Family | Primary: Family

## 2022-07-10 ENCOUNTER — Ambulatory Visit: Admit: 2022-07-10 | Discharge: 2022-07-11 | Payer: PRIVATE HEALTH INSURANCE

## 2022-07-10 DIAGNOSIS — R600 Localized edema: Principal | ICD-10-CM

## 2022-07-10 DIAGNOSIS — Z942 Lung transplant status: Principal | ICD-10-CM

## 2022-07-10 MED ORDER — AMOXICILLIN 875 MG-POTASSIUM CLAVULANATE 125 MG TABLET
ORAL_TABLET | Freq: Two times a day (BID) | ORAL | 0 refills | 14 days | Status: CP
Start: 2022-07-10 — End: 2022-07-24
  Filled 2022-07-10: qty 28, 14d supply, fill #0

## 2022-07-10 MED ORDER — DOXYCYCLINE HYCLATE 100 MG TABLET
ORAL_TABLET | Freq: Two times a day (BID) | ORAL | 0 refills | 14 days | Status: CP
Start: 2022-07-10 — End: 2022-07-24
  Filled 2022-07-10: qty 11, 6d supply, fill #0

## 2022-07-13 MED FILL — DOXYCYCLINE HYCLATE 100 MG TABLET: ORAL | 9 days supply | Qty: 17 | Fill #1

## 2022-07-16 ENCOUNTER — Ambulatory Visit: Admit: 2022-07-16 | Discharge: 2022-07-17 | Payer: PRIVATE HEALTH INSURANCE

## 2022-07-17 DIAGNOSIS — Z942 Lung transplant status: Principal | ICD-10-CM

## 2022-07-17 DIAGNOSIS — M109 Gout, unspecified: Principal | ICD-10-CM

## 2022-07-19 ENCOUNTER — Ambulatory Visit: Admit: 2022-07-19 | Discharge: 2022-07-19 | Payer: PRIVATE HEALTH INSURANCE

## 2022-07-19 DIAGNOSIS — D801 Nonfamilial hypogammaglobulinemia: Principal | ICD-10-CM

## 2022-07-19 DIAGNOSIS — M85859 Other specified disorders of bone density and structure, unspecified thigh: Principal | ICD-10-CM

## 2022-07-19 DIAGNOSIS — Z942 Lung transplant status: Principal | ICD-10-CM

## 2022-07-19 DIAGNOSIS — Q348 Other specified congenital malformations of respiratory system: Principal | ICD-10-CM

## 2022-07-19 DIAGNOSIS — M109 Gout, unspecified: Principal | ICD-10-CM

## 2022-07-21 DIAGNOSIS — D801 Nonfamilial hypogammaglobulinemia: Principal | ICD-10-CM

## 2022-07-21 DIAGNOSIS — Z942 Lung transplant status: Principal | ICD-10-CM

## 2022-07-25 ENCOUNTER — Ambulatory Visit: Admit: 2022-07-25 | Discharge: 2022-07-26 | Payer: PRIVATE HEALTH INSURANCE

## 2022-07-25 DIAGNOSIS — Z942 Lung transplant status: Principal | ICD-10-CM

## 2022-07-25 DIAGNOSIS — Q348 Other specified congenital malformations of respiratory system: Principal | ICD-10-CM

## 2022-07-25 DIAGNOSIS — D501 Sideropenic dysphagia: Principal | ICD-10-CM

## 2022-07-25 MED ORDER — TACROLIMUS 1 MG CAPSULE, IMMEDIATE-RELEASE
ORAL_CAPSULE | Freq: Every evening | ORAL | 3 refills | 90 days | Status: CP
Start: 2022-07-25 — End: 2023-07-25

## 2022-07-25 MED ORDER — SYRINGE 3 ML 21 GAUGE X 1"
0 refills | 0 days | Status: CP
Start: 2022-07-25 — End: ?

## 2022-07-25 MED ORDER — BD LUER-LOK SYRINGE 3 ML 20 GAUGE X 1"
9 refills | 0 days | Status: CP
Start: 2022-07-25 — End: ?

## 2022-07-25 MED ORDER — BD LUER-LOK SYRINGE 3 ML 21 GAUGE X 1"
8 refills | 0 days | Status: CP
Start: 2022-07-25 — End: ?

## 2022-07-25 MED ORDER — TACROLIMUS 5 MG CAPSULE, IMMEDIATE-RELEASE
ORAL_CAPSULE | Freq: Two times a day (BID) | ORAL | 3 refills | 90 days | Status: CP
Start: 2022-07-25 — End: 2023-07-25
  Filled 2022-07-30: qty 60, 30d supply, fill #0

## 2022-07-25 MED ORDER — SYRINGE WITH NEEDLE 3 ML 20 GAUGE X 1 1/2"
0 refills | 0 days | Status: CP
Start: 2022-07-25 — End: ?
  Filled 2022-08-30: qty 100, 90d supply, fill #0

## 2022-07-25 MED FILL — BUDESONIDE-FORMOTEROL HFA 160 MCG-4.5 MCG/ACTUATION AEROSOL INHALER: RESPIRATORY_TRACT | 30 days supply | Qty: 10.2 | Fill #2

## 2022-07-25 MED FILL — ALBUTEROL SULFATE HFA 90 MCG/ACTUATION AEROSOL INHALER: RESPIRATORY_TRACT | 25 days supply | Qty: 8.5 | Fill #3

## 2022-07-25 MED FILL — ALBUTEROL SULFATE 2.5 MG/3 ML (0.083 %) SOLUTION FOR NEBULIZATION: RESPIRATORY_TRACT | 19 days supply | Qty: 225 | Fill #2

## 2022-07-26 MED ORDER — MYCOPHENOLATE MOFETIL 250 MG CAPSULE
ORAL_CAPSULE | Freq: Every day | ORAL | 0 refills | 90 days
Start: 2022-07-26 — End: 2023-07-26

## 2022-07-26 NOTE — Unmapped (Signed)
[facility] Specialty Medication Onboarding    Specialty Medication: Tacrolimus 1mg  capsule  Prior Authorization: Not Required   Financial Assistance: No - copay  <$25  Final Copay/Day Supply: $5 / 30 days    Insurance Restrictions: Yes - max 1 month supply     Notes to Pharmacist: N/A  Credit Card on File: no      [facility] Specialty Medication Onboarding    Specialty Medication: Tacrolimus 5mg  capsule  Prior Authorization: Not Required   Financial Assistance: No - copay  <$25  Final Copay/Day Supply: $5 / 30 days    Insurance Restrictions: Yes - max 1 month supply     Notes to Pharmacist: N/A    The triage team has completed the benefits investigation and has determined that the patient is able to fill this medication at [facility]. Please contact the patient to complete the onboarding or follow up with the prescribing physician as needed.

## 2022-07-26 NOTE — Unmapped (Signed)
Pt not established with me, only cared for in hospital, pls fwd to Dr Dudley Major

## 2022-07-27 LAB — HLA DS POST TRANSPLANT
ANTI-DONOR DRW #1 MFI: 320 MFI
ANTI-DONOR DRW #2 MFI: 320 MFI
ANTI-DONOR HLA-A #1 MFI: 0 MFI
ANTI-DONOR HLA-A #2 MFI: 50 MFI
ANTI-DONOR HLA-B #1 MFI: 18 MFI
ANTI-DONOR HLA-B #2 MFI: 27 MFI
ANTI-DONOR HLA-C #1 MFI: 0 MFI
ANTI-DONOR HLA-C #2 MFI: 0 MFI
ANTI-DONOR HLA-DP #2 MFI: 79 MFI
ANTI-DONOR HLA-DQB #1 MFI: 22 MFI
ANTI-DONOR HLA-DQB #2 MFI: 22 MFI
ANTI-DONOR HLA-DR #1 MFI: 61 MFI
ANTI-DONOR HLA-DR #2 MFI: 61 MFI

## 2022-07-27 LAB — FSAB CLASS 2 ANTIBODY SPECIFICITY: HLA CL2 AB RESULT: NEGATIVE

## 2022-07-27 LAB — FSAB CLASS 1 ANTIBODY SPECIFICITY: HLA CLASS 1 ANTIBODY RESULT: NEGATIVE

## 2022-07-27 NOTE — Unmapped (Signed)
This onboarding is for the following medications:  1) Prograf  2) Cellcept  3) Valcyte      [facility]   Patient Onboarding/Medication Counseling    Willie Snyder is a 62 y.o. 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  I am speaking to the patient.    Was a Nurse, learning disability used for this call? No    Verified patient's date of birth / HIPAA.    Specialty medication(s) to be sent: Transplant: Patient denied refills on tacrolimus, mycophenolate and valganciclovir today.      Non-specialty medications/supplies to be sent: none      Medications not needed at this time: none     The patient declined counseling on missed dose instructions, goals of therapy, side effects and monitoring parameters, warnings and precautions, drug/food interactions, and storage, handling precautions, and disposal because they have taken the medication previously. The information in the declined sections below are for informational purposes only and was not discussed with patient.     Valcyte (valganciclovir)    Medication & Administration     Dosage:   Take one tablet daily.    Administration:   Take with food  Swallow the pills whole, do not break, crush, or chew    Adherence/Missed dose instructions:  Take a missed dose as soon as you think about it with food  If it is close to your next dose, skip the missed dose and go back to your normal time.  Do not take 2 doses at the same time or extra doses.  Report any missed doses to coordinator    Goals of Therapy     To prevent or treat CMV infection in setting of solid organ transplant    Side Effects & Monitoring Parameters   Common side effects  Headache  Diarrhea or constipation  Appetite or sleep disturbances  Back, muscle, joint, or belly pain  Weight loss  Dizziness  Muscle spasm  Upset stomach or vomiting    The following side effects should be reported to the provider:  Allergic reaction  (rash, hives, swelling, blistered or peeling skin, shortness of breath)  Infection (fever, chills, sore throat, ear/sinus pain, cough, sputum change, urinary pain, mouth sores, non-healing wounds)  Bleeding (cough ground vomit, blood in urine, black/red/tarry stools, unexplained bruising or bleeding)  Electrolyte problems (mood changes, confusion, weakness, abnormal heartbeat, seizures)  Kidney problems (urine changes, weight gain)  Yellowing skin or eyes  Swelling in arms, legs, stomach  Severe dizziness or passing out  Eye issues (eyesight changes, pain, or irritation)  Night sweats    Monitoring parameters  Have eye exam as directed by doctor  CMV counts  CBC  Renal function  Pregnancy test prior to initiation    Contraindications, Warnings, & Precautions   BBW: severe leukopenia, neutropenia, anemia, thrombocytopenia, pancytopenia, and bone marrow failure, including aplastic anemia have been reported  BBW: may cause temporary or permanent inhibition of spermatogenesis and suppression of fertilty; has the potential to cause birth defects and cancers in humans  Male patients should have pregnancy test prior to initiation and use birth control for at least 30 days after discontinuation  Male patients should use a barrier contraceptive while on therapy and for 90 days after discontinuation  Acute renal failure  Not indicated for use in liver transplant recipients  Breastfeeding is not recommended    Drug/Food Interactions   Medication list reviewed in Epic. The patient was instructed  to inform the care team before taking any new medications or supplements. No drug interactions identified.   Check with your doctor before getting any vaccinations (live or inactivated)    Storage, Handling Precautions, & Disposal   Store at room temperature  Keep away from children and pets    The patient declined counseling on missed dose instructions, goals of therapy, side effects and monitoring parameters, warnings and precautions, drug/food interactions, and storage, handling precautions, and disposal because they have taken the medication previously. The information in the declined sections below are for informational purposes only and was not discussed with patient.     Cellcept (mycopheonlate mofetil)    Medication & Administration     Dosage: Take 1  capsule  (250mg ) by mouth one times daily    Administration:   Take by mouth with or without food.   Taking with food can minimize GI side effects.   Swallow capsules whole, do not crush or chew.  Oral suspension should be shaken well prior to administration.  Do not mix with other medications and discard any unused portion 60 days after constitution.      Adherence/Missed dose instructions:  Take a missed dose as soon as you remember it . If it is close to the time of your next dose, skip the missed dose and resume your normal schedule.Never take 2 doses to try and catch up from a missed dose.    Goals of Therapy     Prevent organ rejection    Side Effects & Monitoring Parameters     Feeling tired or weak  Shakiness  Trouble sleeping  Diarrhea, abdominal pain, nausea, vomiting, constipation or decreased appetite  Decreases in blood counts   Back or joint pain  Hypertension or hypotension  High blood sugar  Headache  Skin rash    The following side effects should be reported to the provider:  Reduced immune function - report signs of infection such as fever; chills; body aches; very bad sore throat; ear or sinus pain; cough; more sputum or change in color of sputum; pain with passing urine; wound that will not heal, etc.  Also at a slightly higher risk of some malignancies (mainly skin and blood cancers) due to this reduced immune function.  Allergic reaction (rash, hives, swelling, shortness of breath)  High blood sugar (confusion, feeling sleepy, more thirst, more hungry, passing urine more often, flushing, fast breathing, or breath that smells like fruit)  Electrolyte issues (mood changes, confusion, muscle pain or weakness, a heartbeat that does not feel normal, seizures, not hungry, or very bad upset stomach or throwing up)  High or low blood pressure (bad headache or dizziness, passing out, or change in eyesight)  Kidney issues (unable to pass urine, change in how much urine is passed, blood in the urine, or a big weight gain)  Skin (oozing, heat, swelling, redness, or pain), UTI and other infections   Chest pain or pressure  Abnormal heartbeat  Unexplained bleeding or bruising  Abnormal burning, numbness, or tingling  Muscle cramps,  Yellowing of skin or eyes    Monitoring parameters  Pregnancy   CBC   Renal and hepatic function    Contraindications, Warnings, & Precautions     *This is a REMS drug and an FDA-approved patient medication guide will be printed with each dispensation  Black Box Warning: Infections   Black Box Warning: Lymphoproliferative disorders - risk of development of lymphoma and skin malignancy is increased  [employer]  Warning: Use during pregnancy is associated with increased risks of first trimester pregnancy loss and congenital malformations.   Black Box Warning: Females of reproductive potential should use contraception during treatment and for 6 weeks after therapy is discontinued  Is patient using an effective method of contraception? No  If yes, method of contraception:  n/a  CNS depression  New or reactivated viral infections  Neutropenia  Male patients and/or their male partners should use effective contraception during treatment of the male patient and for at least 3 months after last dose.  Breastfeeding is not recommended during therapy and for 6 weeks after last dose    Drug/Food Interactions     Medication list reviewed in Epic. The patient was instructed to inform the care team before taking any new medications or supplements. No drug interactions identified.   Separate doses of antacids and this medication  Check with your doctor before getting any vaccinations    Storage, Handling Precautions, & Disposal     Store at room temperature in a dry place  This medication is considered hazardous. Wash hands after handling and store out of reach or others, including children and pets.    The patient declined counseling on missed dose instructions, goals of therapy, side effects and monitoring parameters, warnings and precautions, drug/food interactions, and storage, handling precautions, and disposal because they have taken the medication previously. The information in the declined sections below are for informational purposes only and was not discussed with patient.     Prograf (tacrolimus)    Medication & Administration     Dosage: Take 5 capsules (5mg  total) in the morning and 6 capsules (6mg  total) in the evening.     Administration:   May take with or without food  Take 12 hours apart    Adherence/Missed dose instructions:  Take a missed dose as soon as you think about it.  If it is close to the time for your next dose, skip the missed dose and go back to your normal time.  Do not take 2 doses at the same time or extra doses.    Goals of Therapy     To prevent organ rejection    Side Effects & Monitoring Parameters     Common side effects  Dizziness  Fatigue  Headache  Stuffy nose or sore throat  Nausea, vomiting, stomach pain, diarrhea, constipation  Heartburn  Back or joint pain  Increased risk of infection    The following side effects should be reported to the provider:  Allergic reaction  Kidney issues (change in quantity or urine passed, blood in urine, or weight gain)  High blood pressure (dizziness, change in eyesight, headache)  Electrolyte issues (change in mood, confusion, muscle pain, or weakness)  Abnormal breathing  Shakiness  Unexplained bleeding or bruising (gums bleeding, blood in urine, nosebleeds, any abnormal bleeding)  Signs of infection (fever, cough, wounds that will not heal)  Skin changes (sores, paleness, new or changed bumps or moles)    Monitoring Parameters  Renal function  Liver function  Glucose levels  Blood pressure  Tacrolimus trough levels  Cardiac monitoring (for QT prolongation)      Contraindications, Warnings, & Precautions     Black Box Warning: Infections - immunosuppressant agents increase the risk of infection that may lead to hospitalization or death  Black Box Warning: Malignancy - immunosuppressant agents may be associated with the development of malignancies that may lead to hospitalization or death  Limit or avoid sun  and ultraviolet light exposure, use appropriate sun protection  Myocardial hypertrophy -avoid use in patients with congenital long QT syndrome  Diabetes mellitus - the risk for new-onset diabetes and insulin-dependent post-transplant diabetes mellitus is increased with tacrolimus use after transplantation  GI perforation  Hyperkalemia  Hypertension  Nephrotoxicity  Neurotoxicity  This is a narrow therapeutic index drug. Do not switch manufacturers without first talking to the provider.    Drug/Food Interactions     Medication list reviewed in Epic. The patient was instructed to inform the care team before taking any new medications or supplements. No drug interactions identified.   Avoid alcohol  Avoid grapefruit or grapefruit juice  Avoid live vaccines    Storage, Handling Precautions, & Disposal     Store at room temperature  Keep away from children and pets      Current Medications (including OTC/herbals), Comorbidities and Allergies     Current Outpatient Medications   Medication Sig Dispense Refill    acetaminophen (TYLENOL) 325 MG tablet Take 2 tablets (650 mg total) by mouth every four (4) hours as needed (headache).      albuterol 2.5 mg /3 mL (0.083 %) nebulizer solution Inhale 3 mL (2.5 mg total) by nebulization every four (4) hours as needed for wheezing. 225 mL 3    albuterol HFA 90 mcg/actuation inhaler Inhale 2 puffs every six (6) hours as needed for wheezing. 8.5 g 11    allopurinol (ZYLOPRIM) 100 MG tablet Take 1 tablet (100 mg total) by mouth daily. To start AFTER complete resolution of gout flare 90 tablet 3    amikacin (AMIKIN) 500 mg/2 mL injection MIX 0.5 ML IN OF WATER AND USE IN NETI POT. DO THIS TWICE DAILY FOR EACH NOSTRIL. DISCARD VIAL AFTER USE (DO NOT SAVE REMAINING FOR LATER USE) 120 mL 11    aspirin 81 MG chewable tablet Chew 1 tablet (81 mg total) daily. 30 tablet 0    atorvastatin (LIPITOR) 20 MG tablet Take 1 tablet (20 mg total) by mouth once daily in the evening. 30 tablet 11    azithromycin (ZITHROMAX) 250 MG tablet Take 1 tablet (250 mg total) by mouth daily. 90 tablet 3    aztreonam lysine (CAYSTON) 75 mg/mL Nebu nebulization solution INHALE 1 VIAL (75 MG) VIA ALTERA NEBULIZER THREE TIMES DAILY CYCLING FOR 21 DAYS ON AND 21 DAYS OFF Strength: 75 mg/mL 84 mL 4    budesonide-formoterol (SYMBICORT) 160-4.5 mcg/actuation inhaler Inhale 2 puffs Two (2) times a day. 10.2 g 11    CALCIUM ORAL Take 1 gum by mouth two (2) times a day.      cholecalciferol, vitamin D3, (VITAMIN D3 ORAL) Take 2 gum by mouth two (2) times a day.      filgrastim-aafi (NIVESTYM) 300 mcg/0.5 mL Syrg injection syringe Inject 0.5 mL (300 mcg total) under the skin daily for 3 days. 1.5 mL 0    fluticasone propionate (FLONASE) 50 mcg/actuation nasal spray 2 sprays into each nostril two (2) times a day.      furosemide (LASIX) 40 MG tablet Take 1 tablet (40 mg total) by mouth two (2) times a day. 60 tablet 5    magnesium oxide (MAG-OX) 400 mg (241.3 mg magnesium) tablet Take 1 tablet (400 mg total) by mouth two (2) times a day.      metoPROLOL tartrate (LOPRESSOR) 25 MG tablet Take 1 tablet (25 mg total) by mouth every four (4) hours as needed (for SVT, HR>150). 30 tablet 0  montelukast (SINGULAIR) 10 mg tablet Take 1 tablet (10 mg total) by mouth nightly. 90 tablet 3    multivitamin,tx-minerals Tab Take 1 tablet by mouth.      mycophenolate (CELLCEPT) 250 mg capsule Take 1 capsule (250 mg total) by mouth daily. 90 capsule 0    pantoprazole (PROTONIX) 40 MG tablet Take 1 tablet (40 mg total) by mouth two (2) times a day. 180 tablet 3    predniSONE (DELTASONE) 2.5 MG tablet Take 1 tablet (2.5 mg total) by mouth in the morning. Take with 5mg  tablet for total dose of 7.5mg  daily. 90 tablet 3    predniSONE (DELTASONE) 5 MG tablet Take 1 tablet by mouth daily (take with 2.5mg  tablet for a total daily dose of 7.5 MG) 90 tablet 3    SITagliptin phosphate (JANUVIA) 50 MG tablet Take 1 tablet (50 mg total) by mouth daily. 90 tablet 3    sulfamethoxazole-trimethoprim (BACTRIM) 400-80 mg per tablet Take 1 tablet (80 mg of trimethoprim total) by mouth Every Monday, Wednesday, and Friday. 36 tablet 3    syringe with needle (BD LUER-LOK SYRINGE) 3 mL 20 gauge x 1 1/2 Syrg Use as directed to withdraw Amikacin 510 each 0    syringe with needle (BD LUER-LOK SYRINGE) 3 mL 20 gauge x 1 Syrg Use as directed to withdraw Amikacin 60 each 9    syringe with needle (BD LUER-LOK SYRINGE) 3 mL 21 gauge x 1 Syrg Use as directed 60 each 8    syringe with needle (BD LUER-LOK SYRINGE) 3 mL 25 x 5/8 Syrg Use as directed with amikacin twice daily 182 each 0    syringe with needle (SYRINGE 3CC/21GX1) 3 mL 21 gauge x 1 Syrg Use as directed 1028 each 0    tacrolimus (PROGRAF) 1 MG capsule Take 1 capsule (1 mg total) by mouth nightly. Take with one 5mg  capsule (total of 6mg  nightly) 90 capsule 3    tacrolimus (PROGRAF) 5 MG capsule Take 1 capsule (5 mg total) by mouth two (2) times a day. Take with one 1mg  capsule nightly (total of 6mg  nightly) 180 capsule 3    valGANciclovir (VALCYTE) 450 mg tablet Take 1 tablet (450 mg total) by mouth daily. 90 tablet 3    valsartan (DIOVAN) 40 MG tablet Take 1 tablet (40 mg total) by mouth daily. 30 tablet 11     No current facility-administered medications for this visit.       Allergies   Allergen Reactions    Cefepime Hives    Cephalosporins Hives    Piperacillin-Tazobactam Rash     12/03/14 - reacted to zosyn, however did not receive premeds' as in past - [facility]  Patient tolerates zosyn with benadryl and loratadine.  Slowing the infusion to be given over 3 hours also seemed to help.    Iodinated Contrast Media      PLEASE CALL LUNG TRANSPLANT ATTENDING    Adhesive Dermatitis    Adhesive Tape-Silicones Dermatitis    Chlorhexidine Dermatitis     Blisters    Latex, Natural Rubber Dermatitis    Meropenem Other (See Comments)     In the past - itching, hives - tolerated with extended infusion and diphenhydramine    *As of 01/2021, patient said he has tolerated without any modification*    Tegaderm Adhesive-No Drug-Allergy Check Dermatitis and Other (See Comments)     Blisters from last CVAD dressing       Patient Active Problem List   Diagnosis  Primary ciliary dyskinesia    Sinusitis, chronic    Fever    Pneumonia    Pseudomonal pneumonia (CMS-HCC)    Rib pain on right side    Intercostal muscle pain    Tinea corporis    Drug rash    Acute dyspnea    Transaminitis    Lung transplant status, bilateral (CMS-HCC)    Hypogammaglobulinemia (CMS-HCC)    Rhinitis, nonallergic, chronic    PCD (primary ciliary dyskinesia)    Primary pulmonary hypertension (CMS-HCC)    Dyspnea    Chronic bronchitis (CMS-HCC)    Rhinosinusitis    Lung transplanted (CMS-HCC)    Posterior subcapsular age-related cataract of both eyes    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    Diabetes mellitus (CMS-HCC)    ETD (Eustachian tube dysfunction), bilateral    Periorbital swelling    Stage 3 chronic kidney disease (CMS-HCC)    GERD (gastroesophageal reflux disease)    Lower extremity edema    Preseptal cellulitis of right eye    Iron deficiency anemia secondary to inadequate dietary iron intake    Cytomegalovirus (CMV) viremia (CMS-HCC)    BK viremia    Osteopenia    Hearing loss    Middle ear effusion, bilateral    Recurrent infections    Rash    Leukocytosis    SVT (supraventricular tachycardia) (CMS-HCC)    History of nonmelanoma skin cancer    Hypomagnesemia    Chronic gout    Pneumocystosis pneumonia (CMS-HCC)    Bilateral lower extremity edema    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    Appropriateness of Therapy     Acute infections noted within Epic:  MDR Pseudomonas  Patient reported infection: None    Is medication and dose appropriate based on diagnosis and infection status? Yes    Prescription has been clinically reviewed: Yes      Baseline Quality of Life Assessment      How many days over the past month did your lung transplant  keep you from your normal activities? For example, brushing your teeth or getting up in the morning. 0    Financial Information     Medication Assistance provided: None Required    Anticipated copay of $5 each tacrolimus 1mg , tacrolimus 5mg  and mycophenolate 250mg  reviewed with patient. Verified delivery address.    Delivery Information     Scheduled delivery date: Patient declined refills today, he is going to pick up at [facility]    Expected start date: Patient has medication on hand at home and is taking.      Medication will be delivered via UPS to the prescription address in [facility].  This shipment will not require a signature.      Explained the services we provide at [facility] and that each month we would call to set up refills.  Stressed importance of returning phone calls so that we could ensure they receive their medications in time each month.  Informed patient that we should be setting up refills 7-10 days prior to when they will run out of medication.  A pharmacist will reach out to perform a clinical assessment periodically.  Informed patient that a welcome packet, containing information  about our pharmacy and other support services, a Notice of Privacy Practices, and a drug information handout will be sent. The patient or caregiver noted above participated in the development of this care plan and knows that they can request review of or adjustments to the care plan at any time.      Patient or caregiver verbalized understanding of the above information as well as how to contact the pharmacy at [phone] option 4 with any questions/concerns.  The pharmacy is open Monday through Friday 8:30am-4:30pm.  A pharmacist is available 24/7 via pager to answer any clinical questions they may have.    Patient Specific Needs     Does the patient have any physical, cognitive, or cultural barriers? No    Does the patient have adequate living arrangements? (i.e. the ability to store and take their medication appropriately) Yes    Did you identify any home environmental safety or security hazards? No    Patient prefers to have medications discussed with  Patient     Is the patient or caregiver able to read and understand education materials at a high school level or above? Yes    Patient's primary language is  English     Is the patient high risk? Yes, patient is taking a REMS drug. Medication is dispensed in compliance with REMS program    SOCIAL DETERMINANTS OF HEALTH     At the [facility], we have learned that life circumstances - like trouble affording food, housing, utilities, or transportation can affect the health of many of our patients.   That is why we wanted to ask: are you currently experiencing any life circumstances that are negatively impacting your health and/or quality of life? Patient declined to answer    Social Determinants of Health     Financial Resource Strain: Low Risk  (08/29/2021)    Overall Financial Resource Strain (CARDIA)     Difficulty of Paying Living Expenses: Not hard at all   Internet Connectivity: No Internet connectivity concern identified (08/29/2021)    Internet Connectivity     Do you have access to internet services: Yes     How do you connect to the internet: Personal Device at home Is your internet connection strong enough for you to watch video on your device without major problems?: Yes     Do you have enough data to get through the month?: Yes     Does at least one of the devices have a camera that you can use for video chat?: Yes   Food Insecurity: No Food Insecurity (08/29/2021)    Hunger Vital Sign     Worried About Running Out of Food in the Last Year: Never true     Ran Out of Food in the Last Year: Never true   Tobacco Use: Low Risk  (07/10/2022)    Patient History     Smoking Tobacco Use: Never     Smokeless Tobacco Use: Never     Passive Exposure: Never   Housing/Utilities: Low Risk  (08/29/2021)    Housing/Utilities     Within the past 12 months, have you ever stayed: outside, in a car, in a tent, in an overnight shelter, or temporarily in someone else's home (i.e. couch-surfing)?: No     Are you worried about losing your housing?: No     Within the past 12 months, have you been unable to get utilities (heat, electricity) when it was really needed?: No   Alcohol Use:  Not At Risk (08/29/2021)    Alcohol Use     How often do you have a drink containing alcohol?: Never     How many drinks containing alcohol do you have on a typical day when you are drinking?: 1 - 2     How often do you have 5 or more drinks on one occasion?: Never   Transportation Needs: No Transportation Needs (08/29/2021)    PRAPARE - Transportation     Lack of Transportation (Medical): No     Lack of Transportation (Non-Medical): No   Substance Use: Low Risk  (08/29/2021)    Substance Use     Taken prescription drugs for non-medical reasons: Never     Taken illegal drugs: Never     Patient indicated they have taken drugs in the past year for non-medical reasons: Yes, [positive answer(s)]: Not on file   Health Literacy: Low Risk  (08/29/2021)    Health Literacy     : Never   Physical Activity: Sufficiently Active (08/29/2021)    Exercise Vital Sign     Days of Exercise per Week: 7 days     Minutes of Exercise per Session: 120 min   Interpersonal Safety: Not at risk (08/29/2021)    Interpersonal Safety     Unsafe Where You Currently Live: No     Physically Hurt by Anyone: No     Abused by Anyone: No   Stress: No Stress Concern Present (08/29/2021)    [employer] of Occupational Health - Occupational Stress Questionnaire     Feeling of Stress : Not at all   Intimate Partner Violence: Not At Risk (08/29/2021)    Humiliation, Afraid, Rape, and Kick questionnaire     Fear of Current or Ex-Partner: No     Emotionally Abused: No     Physically Abused: No     Sexually Abused: No   Depression: Not at risk (07/10/2022)    PHQ-2     PHQ-2 Score: 0   Social Connections: Socially Isolated (08/29/2021)    Social Connection and Isolation Panel [NHANES]     Frequency of Communication with Friends and Family: Once a week     Frequency of Social Gatherings with Friends and Family: Never     Attends Religious Services: Never     Database administrator or Organizations: No     Attends Engineer, structural: Never     Marital Status: Married       Would you be willing to receive help with any of the needs that you have identified today? Not applicable       Tera Helper, [facility]  [facility] Specialty Pharmacist

## 2022-07-30 MED FILL — AMIKACIN 500 MG/2 ML INJECTION SOLUTION: RESPIRATORY_TRACT | 30 days supply | Qty: 120 | Fill #9

## 2022-08-03 DIAGNOSIS — Z942 Lung transplant status: Principal | ICD-10-CM

## 2022-08-03 NOTE — Unmapped (Signed)
Pt called c/o swollen painful and red great R toe.  He has taken colchicine in place of allopurinol x 3 days but does not note improvement to symptoms.  D/w Dr. Glenard Haring and will have patient come to be assessed in clinic on monday.  Per Chrissy Doligalski CPP, allopurinol does not need to be stopped when treating a flare.  Instructed patient to monitor over the weekend, notify us for progressive symptoms.  HE verbalized understanding and is in agreement with plan.

## 2022-08-06 ENCOUNTER — Ambulatory Visit: Admit: 2022-08-06 | Discharge: 2022-08-07 | Payer: PRIVATE HEALTH INSURANCE

## 2022-08-06 ENCOUNTER — Ambulatory Visit: Admit: 2022-08-06 | Discharge: 2022-08-07 | Payer: PRIVATE HEALTH INSURANCE | Attending: Family | Primary: Family

## 2022-08-06 DIAGNOSIS — Z942 Lung transplant status: Principal | ICD-10-CM

## 2022-08-06 LAB — CBC W/ AUTO DIFF
BASOPHILS ABSOLUTE COUNT: 0 10*9/L (ref 0.0–0.1)
BASOPHILS RELATIVE PERCENT: 0.7 %
EOSINOPHILS ABSOLUTE COUNT: 0.2 10*9/L (ref 0.0–0.5)
EOSINOPHILS RELATIVE PERCENT: 3.2 %
HEMATOCRIT: 33.5 % — ABNORMAL LOW (ref 39.0–48.0)
HEMOGLOBIN: 10.9 g/dL — ABNORMAL LOW (ref 12.9–16.5)
LYMPHOCYTES ABSOLUTE COUNT: 2.9 10*9/L (ref 1.1–3.6)
LYMPHOCYTES RELATIVE PERCENT: 40.6 %
MEAN CORPUSCULAR HEMOGLOBIN CONC: 32.6 g/dL (ref 32.0–36.0)
MEAN CORPUSCULAR HEMOGLOBIN: 28.9 pg (ref 25.9–32.4)
MEAN CORPUSCULAR VOLUME: 88.4 fL (ref 77.6–95.7)
MEAN PLATELET VOLUME: 8.1 fL (ref 6.8–10.7)
MONOCYTES ABSOLUTE COUNT: 0.3 10*9/L (ref 0.3–0.8)
MONOCYTES RELATIVE PERCENT: 4.8 %
NEUTROPHILS ABSOLUTE COUNT: 3.6 10*9/L (ref 1.8–7.8)
NEUTROPHILS RELATIVE PERCENT: 50.7 %
PLATELET COUNT: 159 10*9/L (ref 150–450)
RED BLOOD CELL COUNT: 3.79 10*12/L — ABNORMAL LOW (ref 4.26–5.60)
RED CELL DISTRIBUTION WIDTH: 19.5 % — ABNORMAL HIGH (ref 12.2–15.2)
WBC ADJUSTED: 7.1 10*9/L (ref 3.6–11.2)

## 2022-08-06 LAB — COMPREHENSIVE METABOLIC PANEL
ALBUMIN: 3.4 g/dL (ref 3.4–5.0)
ALKALINE PHOSPHATASE: 88 U/L (ref 46–116)
ALT (SGPT): 43 U/L (ref 10–49)
ANION GAP: 8 mmol/L (ref 5–14)
AST (SGOT): 29 U/L (ref <=34)
BILIRUBIN TOTAL: 0.4 mg/dL (ref 0.3–1.2)
BLOOD UREA NITROGEN: 30 mg/dL — ABNORMAL HIGH (ref 9–23)
BUN / CREAT RATIO: 14
CALCIUM: 7.2 mg/dL — ABNORMAL LOW (ref 8.7–10.4)
CHLORIDE: 117 mmol/L — ABNORMAL HIGH (ref 98–107)
CO2: 22 mmol/L (ref 20.0–31.0)
CREATININE: 2.08 mg/dL — ABNORMAL HIGH
EGFR CKD-EPI (2021) MALE: 36 mL/min/{1.73_m2} — ABNORMAL LOW (ref >=60)
GLUCOSE RANDOM: 100 mg/dL (ref 70–179)
POTASSIUM: 4.2 mmol/L (ref 3.4–4.8)
PROTEIN TOTAL: 6.4 g/dL (ref 5.7–8.2)
SODIUM: 147 mmol/L — ABNORMAL HIGH (ref 135–145)

## 2022-08-06 LAB — PHOSPHORUS: PHOSPHORUS: 2.9 mg/dL (ref 2.4–5.1)

## 2022-08-06 LAB — CMV DNA, QUANTITATIVE, PCR: CMV VIRAL LD: NOT DETECTED

## 2022-08-06 LAB — TACROLIMUS LEVEL: TACROLIMUS BLOOD: 3.2 ng/mL

## 2022-08-06 LAB — MAGNESIUM: MAGNESIUM: 1.5 mg/dL — ABNORMAL LOW (ref 1.6–2.6)

## 2022-08-06 LAB — EBV QUANTITATIVE PCR, BLOOD: EBV VIRAL LOAD RESULT: NOT DETECTED

## 2022-08-06 LAB — URIC ACID: URIC ACID: 7.8 mg/dL

## 2022-08-06 LAB — IGG: GAMMAGLOBULIN; IGG: 698 mg/dL (ref 650–1600)

## 2022-08-06 MED ORDER — ALLOPURINOL 100 MG TABLET
ORAL_TABLET | Freq: Every day | ORAL | 3 refills | 90 days | Status: CP
Start: 2022-08-06 — End: 2023-08-06
  Filled 2022-08-09: qty 135, 90d supply, fill #0

## 2022-08-06 NOTE — Unmapped (Signed)
Pulmonary Transplant Clinic    HISTORY:   HPI:  61 y.o. y/o with  PCD  s/p BOLT on 02/01/15 here for c/o toe pain and swelling following PsA exacerbation s/p 2 weeks of IV mero, following up after recent discontinuation of voriconazole and transition from DS bactrim to ppx dosing on SS bactrim for treatment of PJP on bronch BAL and scedosporium from his maxillary sinus cx.     - Feeling well overall  - Denies cough, SOB, sputum production, fevers, chills  - Home PFTs are stable  - Still doing cayston 3 weeks on, 3 weeks off  - Endorses pain to R great toe, swelling which started Wed of last week.  - He started taking colcrys and stopped allopurinol on Wed  - Restarted allopurinol and continues colcrys  - Symptoms have resolved today, redness and pain have gone away  - He still has some swelling noted, and he can't bend it.    - He knows foods to avoid with gout, has been drinking cherry juice to help.  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      OBJECTIVE DATA:   PHYSICAL EXAM:    BP 135/78  - Pulse 91  - Temp 37 ??C (98.6 ??F) (Tympanic)  - Resp 18  - Ht 175.3 cm (5' 9.02)  - Wt (!) 101.2 kg (223 lb)  - SpO2 98%  - BMI 32.92 kg/m??     Gen: - awake, alert, in NAD    HEENT: - no adenopathy, TMs clear   CV: - RRR, no murmers or gallops   Pulm: - CTA bilaterally, comfortable   Ext: - +3 pitting LE edema, no clubbing       LABS: (reviewd in Epic, pertinent values noted below)     PFTs:     Date: FVC (% Pred) FEV1 (% Pred) FEV1/FVC FEF25-75(% Pred) TBBx/Results           06/29/22     Deferred   05/25/22 3.43 (85%) 2.57 (81%)  1.93 (71%)    01/12/22 3.73 (92%) 2.98 (94%)  3.10 (113%)    12/19/21 3.54 (81%) 2.90 (85.1%)  3.28 (113.1%)    11/21/21 3.88 (88.7%) 3.21 (94.1%)  3.54 (121.8%)    08/10/21 3.69 (84%) 3.03 (89%)       02/21/2021 3.72 (84.6%) 3.08 (89.8%)   3.40 (115.3%)       01/12/2021 3.6 (81.6%) 2.82 (82%)   2.4   (81.4%)     12/23/20 3.78 (85.6%) 3.12 (90.6%)   3.39 (114.5%)  08/24/20 3.87 (87.5%) 3.15 (91.4%)   3.34 (112.3%     02/19/20 3.87 (87.1%) 3.05 (87.9%)   2.80 (93.2%)     01/27/20 3.94 (86.2%) 3.13 (88.1%)   3.02 (98.3%)     11/27/19 3.99 (89.8%) 3.10 (89.1%)   2.74 (90.8%)     10/02/19 4.05 (88.6%) 3.23 (90.5%)   2.99 (96.8%)     08/14/19 3.67 (81%) 2.93 (82%)   2.74 (88.4%)     05/15/2019 4.14 (90.2%) 3.30 (92.1%)   3.12 (100.3%)     02/25/2019 4.05 (86.1%) 3.28 (91.2%)   3.15 (102.5%)     11/10/2018 4.20 (89.1%) 3.37 (93.7%)   3.26 (106.1%)      08/26/18  4.01  3.14    2.79      03/07/2018 3.70 (78%)  3.01 (83%)   3.11 (100%)     02/12/2019 2.92 (80%) 3.56 (80%)   3.10 (99%)     02/03/18 3.79 (80%) 2.96 (81.4%)   2.50 (80.2%)     01/06/2018 3.67 (77.4%) 2.87 (78.9%)   2.56 (82%)     10/03/17 3.88 (81.5%) 3.10 (84.5%)   2.96 (93.3%)     09/20/2017 3.58 (75%) 2.89 (79%)   2.97 (94%)     06/28/17 4.07 (85%)  3.50 (97%)    4.79 (151%)      04/08/17 3.5 (73.5%) 2.82 (77%)   2.96 (92%) A0Bx 04/2017   03/11/17 3.67 (77%) 3.03 (83%)   83  3.21 (101%)     02/21/16 3.79 (79%) 3.13 (85%)   3.41 (108%)     12/2016 3.83 (81%) 3.18 (88%)   3.48 (111%)      10/16/16 3.82 (80%)  3.06 (84%)  80  2.85 (90%)       09/18/16 3.60 (75%) 2.86 (77%)  80 2.60 (81%) A0Bx     08/28/16 3.78 (81%)  3.07 (85%)   81 3.02 (95%) 06/21/16 3.89 .13   2.90     06/15/16 3.82 (82%) 3.15 (88%)   3.30 (105%)      05/19/16  3.83 (82%)  3.15 (88%)    3.11 (99%)     04/13/16 4.02 (85%)  3.23 (89%)   3.08 (97%)     03/23/16 3.84 (82.3%) 3.20 (89.0%)   3.30 (104.6%)     02/17/16 4.00 (85.7%) 3.34 (92.7%) 83 3.39 (107.6%)     01/13/2016 3.78 3.18 84 3.21 A0B0 12/15/15:    11/23/2015 4.08 (87.55) 3.40 (94.5%)   3.45 (109.4%)     11/10/15 3.96 (85%) 3.26 (90.4%)   3.20 (101.5%)     11/08/15 3.63 (77.9%) 2.94 (81.7%)   2.89 (91.8%) Sick visit   10/28/2015 4.04 (86.6%) 3.40 (94.5%) 84 3.88 (123.1%)     09/30/2015 4.12 (87.8%) 3.48 (96%) 85 4.00 (125.1%)     08/23/15 4.03 (85%) 3.43 (94%) 85 3.78 (118%) A0Bx   07/06/15 3.99 (85%)  3.48 (96%)    4.23 (132%)     06/24/15 4.01 (86.7%) 3.41 (95.3%) 85 3.83 (120.9%)     06/17/15 4.15 (88.4%) 3.52 (97.1%) 85 3.95 (123.4%) A0B0   05/06/15 3.96 (82.1) 3.51 (94.5) 89 4.45 (136.2)      04/22/15  3.92 (81%)  3.39 (91%)    4.12 (126%)     April 08 2015 3.81 (79.1%) 3.34 (89.7%) 88 4.33 (132.4%)     March 25, 2015 3.54 (73.3%) 3.19 ( 85.5%) 90 4.47(136.6%)      March 11, 2015 3.60 (74.7%) 3.16 (84.8%) 88 3.63 (111.0%) A0Bx   February 23, 2015 2.97 (  61.6%) 2.59 (69.5%) 87 3.31 (101%)            IMAGING: (Personally reviewed, our interpretation is below)  CXR 01/12/2021 looks unchanged, no infiltrate seen.    ASSESSMENT and PLAN   Willie Snyder is a 62 y.o. male with h/o PCD s/p BOLT 02/01/15, Stage 3b CKD, GERD, hypogammaglobinemia, gout, and OSA that presented to [facility] with positive PJP PCR via recent BAL/Tbbx on 12/21/21 and positive scedosporium growth from sinus culture 12/19/21, here today for clinic follow up.    Graft Function and Immunosuppression:  - PFTs deferred today for sick visit, CXR has been stable, pt feels great from a pulmonary standpoint.  - RPP negative at last visit apart from rhino (chronic shedder)  - S/p pred 40mg  x3 days, consulted with IC[facility] and appreciate their input.  - S/p IV mero 2g Q12hrs 10-12/2021.  - 12/2017 CT chest with air trapping suggestive of BOS, but no focal consolidation               - 02/09/18 Bronch A0B0, 50% lymphs on paucicellular BAL  - 02/21/2021 CT chest with resolution of nodules, CT 12/19/21 with no acute abnormality, no evidence of pneumonia.  - bronch TBBx 11/9 to look for e/o ACR iso being off MMF   - No DSAs but positive non-DSA anti-HLAs.  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  - Treated most  recently with IV meropenem 10/10-10/24/23, 11/7 imipenem/cilastin/relebactam--> 11/8 imipenem/cilastin/relebactam + inhaled amikacin, systemic abx stopped 11/10   - will get sinus culture today and MADD order so we have expanded sensitivities for the next time he is sick.  - Follows with ENT  - Home amikacin sinus rinses.  - Inhaled cayston 3 weeks on 3 weeks off  - Nasonex twice daily  - Neti-pot with JJ baby wash TID  - s/p Sinus endoscopy with ethmoidectomy antrostomy and tissue removal  - Voriconzole initiated at 200mg  BID for 3 months (mid feb) and patients tacrolimus dose was titrated to 0.5mg  daily, subsequently titrated to 250mg  BID on 01/02/22 --> 300mg  BID (01/12/22).  - Last vori level 2.8 (04/06/22), now OFF vori  - CT sinus 05/23/22 with:  Impression   --Overall similar appearing diffuse paranasal sinus opacification and mucosal thickening on a background of prior functional endoscopic sinus surgery. No areas of new osseous erosion or significant soft tissue inflammatory changes outside of the sinuses to suggest invasive fungal disease or complicated sinusitis.   - OFF voriconazole    Bronchiolitis obliterans w/ organizing pneumonia  Bronchoscopy 12/2021 iso of cough, dyspnea, hypoxia subsequently found to have PJP. However prior to that result TBBx came back with BOOP iso of cellcept being held for cytopenia.   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 Drop in FEV1 and FEF 25-75%. CT chest with LUL nodularity. Bronchoscopy 12/2 with RSV positive, and pseudomonas non aeruginosa species from RML.   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 Minimal oxygen requirements, SpO2 92% at rest on our review.     - back to baseline dose of prednisone 7.5mg  after pred bump and taper 40mg , decreased by 10mg  every 5 days.     CT chest LUL nodularity 01/2021  - bronch cx c/w pan-sensitive PsA  - fu CT chest completed 02/20/2021, resolution of nodules.     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    Gout - Continue allopurinol, prn colcrys  - Increase allopurinol to 150mg  daily    Elevated ASCVD Risk - Continue asa and statin     Bone health:  - Continue ca/vit D  - Last vit D 58.5 (11/2021)  - DEXA 06/2022 low bone density, received reclast 06/29/22      Health Maintenance:   - Last derm visit:                     Summer 2023  - Last Colonsocopy (if >40):   Next due 11/2023  - Influenza vaccine:                 booster 01/12/2021, 11/21/21, 01/12/22  - Prevnar:  09/24/2013  - Last Pneumovax:                  02/14/2007, 12/28/2015, prevnar 20 02/2021  - Last Dexa:                            06/22/22 with osteopenia, giving reclast 06/29/22  - Last vitamin D:                     58.5 (11/2021)  - Last HbA1c:                          6.6 (05/2022)  - Tdap:                                     02/2011, 02/23/21  - Shingrix:                                10/2016, 09/2017  - COVID:                                 03/2019, 04/2019 (Pfizer), 09/13/19, 02/2020, 08/20/20, 11/27/20 (bivalent), spikevax 11/21/21, 05/09/22  - COVID PPX: Evusheld 02/2020, 08/24/2020.  - RSV    01/22/22             Immunization History   Administered Date(s) Administered    COVID-19 VAC,IM,BV(76YR UP)BOOST,PFIZER 11/27/2020    COVID-19 VAC,IM,BV(5-64yr)BOOST,PFIZER 11/27/2020    COVID-19 VACC,MRNA,(PFIZER)(PF)(IM) 04/11/2019, 05/02/2019, 09/13/2019, 02/22/2020, 08/20/2020    INFLUENZA INJ MDCK PF, QUAD,(FLUCELVAX)(8MO AND UP EGG FREE) 12/21/2018    INFLUENZA TIV (TRI) PF (IM) 03/03/2011, 11/13/2011    Influenza Vaccine Quad (IIV4 PF) 50mo+ injectable 10/27/2013, 10/28/2015, 12/14/2016, 03/12/2017, 11/22/2017, 01/06/2018, 11/11/2018, 11/27/2019    Influenza Virus Vaccine, unspecified formulation 11/13/2014, 11/27/2020    PNEUMOCOCCAL POLYSACCHARIDE 23 02/14/2007, 12/28/2015    PPD Test 12/04/2010, 12/11/2010, 05/11/2014, 05/11/2014    Pneumococcal Conjugate 13-Valent 09/24/2013    SHINGRIX-ZOSTER VACCINE (HZV), RECOMBINANT,SUB-UNIT,ADJUVANTED IM 10/16/2016, 09/20/2017    TdaP 03/03/2011       >52min was spent with the patient face to face and >61min was spent reviewing chart/imaging.     Complex medical decision making was done as we adjust medications based on blood work/drug levels and multiple complaints and problems were addressed    The patient was assessed and discussed with Dr. Glenard Haring who is in agreement with plan.

## 2022-08-06 NOTE — Unmapped (Signed)
Called [facility] to discuss lab results. Tacrolimus level was decreased at 3.2 with a goal of 4-6. Medication regimen is currently 5/6. Dosing at this time to remain the same as he has been in range on this dose for several checks, some levels ~6. Repeat labs scheduled Friday. Will increase allopurinol to 150mg  daily per Dr. Glenard Haring.  Resulted labs reviewed. All questions answered. Pt verbalized understanding.

## 2022-08-09 ENCOUNTER — Ambulatory Visit
Admit: 2022-08-09 | Discharge: 2022-08-09 | Disposition: A | Discharge status: Home / Self Care | Payer: PRIVATE HEALTH INSURANCE

## 2022-08-09 ENCOUNTER — Emergency Department
Admit: 2022-08-09 | Discharge: 2022-08-09 | Disposition: A | Discharge status: Home / Self Care | Payer: PRIVATE HEALTH INSURANCE

## 2022-08-09 DIAGNOSIS — Z942 Lung transplant status: Principal | ICD-10-CM

## 2022-08-09 DIAGNOSIS — I471 SVT (supraventricular tachycardia) (CMS-HCC): Principal | ICD-10-CM

## 2022-08-09 LAB — COMPREHENSIVE METABOLIC PANEL
ALBUMIN: 3.5 g/dL (ref 3.4–5.0)
ALKALINE PHOSPHATASE: 76 U/L (ref 46–116)
ALT (SGPT): 32 U/L (ref 10–49)
ANION GAP: 7 mmol/L (ref 5–14)
AST (SGOT): 29 U/L (ref <=34)
BILIRUBIN TOTAL: 0.4 mg/dL (ref 0.3–1.2)
BLOOD UREA NITROGEN: 24 mg/dL — ABNORMAL HIGH (ref 9–23)
BUN / CREAT RATIO: 12
CALCIUM: 7.9 mg/dL — ABNORMAL LOW (ref 8.7–10.4)
CHLORIDE: 118 mmol/L — ABNORMAL HIGH (ref 98–107)
CO2: 17 mmol/L — ABNORMAL LOW (ref 20.0–31.0)
CREATININE: 1.98 mg/dL — ABNORMAL HIGH
EGFR CKD-EPI (2021) MALE: 38 mL/min/{1.73_m2} — ABNORMAL LOW (ref >=60)
GLUCOSE RANDOM: 133 mg/dL (ref 70–179)
POTASSIUM: 3.8 mmol/L (ref 3.4–4.8)
PROTEIN TOTAL: 6.4 g/dL (ref 5.7–8.2)
SODIUM: 142 mmol/L (ref 135–145)

## 2022-08-09 LAB — CBC W/ AUTO DIFF
BASOPHILS ABSOLUTE COUNT: 0 10*9/L (ref 0.0–0.1)
BASOPHILS RELATIVE PERCENT: 0.6 %
EOSINOPHILS ABSOLUTE COUNT: 0.2 10*9/L (ref 0.0–0.5)
EOSINOPHILS RELATIVE PERCENT: 2.9 %
HEMATOCRIT: 34.1 % — ABNORMAL LOW (ref 39.0–48.0)
HEMOGLOBIN: 11.2 g/dL — ABNORMAL LOW (ref 12.9–16.5)
LYMPHOCYTES ABSOLUTE COUNT: 1.4 10*9/L (ref 1.1–3.6)
LYMPHOCYTES RELATIVE PERCENT: 16.8 %
MEAN CORPUSCULAR HEMOGLOBIN CONC: 32.7 g/dL (ref 32.0–36.0)
MEAN CORPUSCULAR HEMOGLOBIN: 28.9 pg (ref 25.9–32.4)
MEAN CORPUSCULAR VOLUME: 88.3 fL (ref 77.6–95.7)
MEAN PLATELET VOLUME: 8.2 fL (ref 6.8–10.7)
MONOCYTES ABSOLUTE COUNT: 0.3 10*9/L (ref 0.3–0.8)
MONOCYTES RELATIVE PERCENT: 4.2 %
NEUTROPHILS ABSOLUTE COUNT: 6.1 10*9/L (ref 1.8–7.8)
NEUTROPHILS RELATIVE PERCENT: 75.5 %
PLATELET COUNT: 156 10*9/L (ref 150–450)
RED BLOOD CELL COUNT: 3.86 10*12/L — ABNORMAL LOW (ref 4.26–5.60)
RED CELL DISTRIBUTION WIDTH: 20 % — ABNORMAL HIGH (ref 12.2–15.2)
WBC ADJUSTED: 8.1 10*9/L (ref 3.6–11.2)

## 2022-08-09 LAB — TSH: THYROID STIMULATING HORMONE: 1.44 u[IU]/mL (ref 0.550–4.780)

## 2022-08-09 LAB — MAGNESIUM: MAGNESIUM: 1.4 mg/dL — ABNORMAL LOW (ref 1.6–2.6)

## 2022-08-09 MED ORDER — ATORVASTATIN 20 MG TABLET
ORAL_TABLET | Freq: Every day | ORAL | 11 refills | 30 days
Start: 2022-08-09 — End: 2023-08-09

## 2022-08-09 MED ORDER — MYCOPHENOLATE MOFETIL 250 MG CAPSULE
ORAL_CAPSULE | Freq: Every day | ORAL | 0 refills | 90 days
Start: 2022-08-09 — End: 2023-08-09

## 2022-08-09 MED ORDER — FUROSEMIDE 40 MG TABLET
ORAL_TABLET | Freq: Two times a day (BID) | ORAL | 5 refills | 30 days
Start: 2022-08-09 — End: 2023-02-05

## 2022-08-09 MED ADMIN — magnesium sulfate in D5W 1 gram/100 mL infusion 1 g: 1 g | INTRAVENOUS | @ 20:00:00 | Stop: 2022-08-09

## 2022-08-09 MED FILL — VALSARTAN 40 MG TABLET: ORAL | 30 days supply | Qty: 30 | Fill #6

## 2022-08-09 NOTE — Unmapped (Signed)
Pt BIB [facility] from home for SVT. HR 170-180, 2, 12mg  Adeonosine push given. NSR now. A&O x 4. VSS.

## 2022-08-09 NOTE — Unmapped (Signed)
Willie Snyder left vm that HR was in 190s, returned call and he said it was 200 and he called EMS.  He will ask that he his brought to [facility] ER.

## 2022-08-09 NOTE — Unmapped (Signed)
Bed: 10-A  Expected date:   Expected time:   Means of arrival:   Comments:  EMS

## 2022-08-09 NOTE — Unmapped (Signed)
[facility]  Emergency Department Provider Note     ED Clinical Impression     Final diagnoses:   SVT (supraventricular tachycardia) (CMS-HCC) (Primary)      Impression, Medical Decision Making, ED Course     Impression: 62 y.o. male who has a past medical history of Basal cell carcinoma, Bronchiectasis (CMS-HCC), Chronic sinusitis, CKD (chronic kidney disease) stage 3, GFR 30-59 ml/min (CMS-HCC) (09/21/2016), Interstitial lung disease (CMS-HCC), and Primary ciliary dyskinesia. who presents with tachycardia as described below.     DDx/MDM: 62 year old with a history significant for bilateral lung transplant in 2016 [place] to primary ciliary dyskinesia, CKD, interstitial lung disease presenting with an episode of tachycardia consistent with SVT.  On initial evaluation patient is in normal sinus rhythm, reviewed EKG rhythm strip with EMS consistent with SVT, responded well to 6 followed by 12 mg of adenosine.  Considered electrolyte abnormalities, metabolic abnormalities, primary arrhythmia, among other etiologies.  Plan for basic labs including CBC, CMP/magnesium, TSH, chest x-ray, EKG.  Considered ACS although patient denies any chest pain, shortness of breath, will defer troponin at this time.    Diagnostic workup as below.     Orders Placed This Encounter   Procedures    XR Chest 2 views    CBC w/ Differential    Comprehensive Metabolic Panel    Magnesium    TSH    ECG 12 Lead       ED Course as of 08/09/22 2014   Thu Aug 09, 2022   1431 XR Chest 2 views  IMPRESSION:     No acute findings.     1609 Magnesium 1.4, appears to be similar to baseline, given patient's arrhythmia today will replete in the ED.   1609 TSH: 1.440   1609 CBC w/ Differential(!):    WBC 8.1   RBC 3.86(!)   HGB 11.2(!)   HCT 34.1(!)   MCV 88.3   MCH 28.9   MCHC 32.7   RDW 20.0(!)   MPV 8.2   Platelet 156   Neutrophils % 75.5   Lymphocytes % 16.8   Monocytes % 4.2   Eosinophils % 2.9   Basophils % 0.6   Absolute Neutrophils 6.1   Absolute Lymphocytes 1.4   Absolute Monocytes  0.3   Absolute Eosinophils 0.2   Absolute Basophils  0.0   Anisocytosis Moderate(!)  WBC 8.1, hemoglobin 11.2, appears at baseline   1610 Comprehensive Metabolic Panel(!):    Sodium 142   Potassium 3.8   Chloride 118(!)   CO2 17.0(!)   Anion Gap 7   Bun 24(!)   Creatinine 1.98(!)   BUN/Creatinine Ratio 12   eGFR CKD-EPI (2021) Male 38(!)   Glucose 133   Calcium 7.9(!)   Albumin 3.5   Total Protein 6.4   Total Bilirubin 0.4   SGOT (AST) 29   ALT 32   Alkaline Phosphatase 76  Mild decrease in CO2 17 although patient is nontoxic, no acute interventions indicated.  Creatinine 1.98, appears similar to baseline.   1708 Stable for discharge.  Discussed discharge instructions and return cautions fully with patient expressed understanding.       MDM Elements  Discussion of Management with other Physicians, QHP or Appropriate Source: None  I have reviewed recent and relevant previous record, including: see HPI  Escalation of Care including OBS/Admission/Transfer was considered: However, patient was determined to be appropriate for outpatient management.    ____________________________________________    The case was discussed with the attending  physician, who is in agreement with the above assessment and plan.      History     Chief Complaint  Chief Complaint   Patient presents with    Tachycardia     HPI   Willie Snyder is a 62 y.o. male with past medical history as below who presents with elevated heart rate.  The patient was watching TV and noted palpitations, used pulse oximeter to evaluate, heart rate was noted to be in the 150s-170s.  EMS was called on scene they reported concern for SVT.  Vagal maneuvers were attempted with no improvement, was given 6 mg grams followed by 12 mg of adenosine with conversion to normal sinus rhythm.  Patient denies chest pain, shortness of breath, abdominal pain, nausea, vomiting, diarrhea, headache, blurred vision, dizziness, urinary symptoms, fever.  Patient has had normal p.o. intake.  Reports 1 previous episode of possible SVT 1 year ago which did not require evaluation, and spontaneously resolved.     Patient's transplant team came to the ED and evaluated the patient, they are okay with the patient having a workup in the ED and otherwise stable for discharge from a transplant perspective no further recommendations.    Outside Historian(s): I have obtained additional history/collateral from family at bedside.    Past Medical History:   Diagnosis Date    Basal cell carcinoma     Bronchiectasis (CMS-HCC)     Chronic sinusitis     CKD (chronic kidney disease) stage 3, GFR 30-59 ml/min (CMS-HCC) 09/21/2016    Interstitial lung disease (CMS-HCC)     Primary ciliary dyskinesia     s/p double lung tx 2016       Past Surgical History:   Procedure Laterality Date    APPENDECTOMY      CATARACT EXTRACTION EXTRACAPSULAR W/ INTRAOCULAR LENS IMPLANTATION Right 03/15/2016     [name], MD    INGUINAL HERNIA REPAIR Right     IR INSERT PORT AGE GREATER THAN 5 YRS  01/16/2021    IR INSERT PORT AGE GREATER THAN 5 YRS 01/16/2021 Gwenlyn Fudge, MD IMG VIR [facility]    LUNG TRANSPLANT Bilateral     Double lung     NASAL SINUS SURGERY      PR BRONCHOSCOPY,DIAGNOSTIC N/A 02/02/2015    Procedure: Bronchoscopy, Rigid Or Flexible, W/Wo Fluoroscopic Guidance; Diagnostic, With Cell Washing, When Performed;  Surgeon: Evert Kohl, MD;  Location: MAIN OR [facility];  Service: Cardiothoracic    PR BRONCHOSCOPY,DIAGNOSTIC N/A 02/01/2015    Procedure: Bronchoscopy, Rigid Or Flexible, W/Wo Fluoroscopic Guidance; Diagnostic, With Cell Washing, When Performed;  Surgeon: Evert Kohl, MD;  Location: MAIN OR [facility];  Service: Cardiothoracic    PR BRONCHOSCOPY,DIAGNOSTIC W LAVAGE Bilateral 03/02/2015    Procedure: BRONCHOSCOPY, RIGID OR FLEXIBLE, INCLUDE FLUOROSCOPIC GUIDANCE WHEN PERFORMED; W/BRONCHIAL ALVEOLAR LAVAGE With Moderate Sedation.;  Surgeon: Blair Dolphin, MD;  Location: BRONCH PROCEDURE LAB [facility];  Service: Pulmonary    PR BRONCHOSCOPY,DIAGNOSTIC W LAVAGE Bilateral 05/24/2015    Procedure: BRONCHOSCOPY, RIGID OR FLEXIBLE, INCLUDE FLUOROSCOPIC GUIDANCE WHEN PERFORMED; W/BRONCHIAL ALVEOLAR LAVAGE WITH MODERATE SEDATION;  Surgeon: Blair Dolphin, MD;  Location: BRONCH PROCEDURE LAB [facility];  Service: Pulmonary    PR BRONCHOSCOPY,DIAGNOSTIC W LAVAGE Bilateral 08/23/2015    Procedure: BRONCHOSCOPY, FLEXIBLE, INCLUDE FLUOROSCOPIC GUIDANCE WHEN PERFORMED; W/BRONCHIAL ALVEOLAR LAVAGE WITH MODERATE SEDATION;  Surgeon: Jamesetta Geralds, MD;  Location: [facility] PROCEDURE LAB [facility];  Service: Pulmonary    PR BRONCHOSCOPY,DIAGNOSTIC W LAVAGE Bilateral 12/15/2015  Procedure: BRONCHOSCOPY, RIGID OR FLEXIBLE, INCLUDE FLUOROSCOPIC GUIDANCE WHEN PERFORMED; W/BRONCHIAL ALVEOLAR LAVAGE WITH MODERATE SEDATION;  Surgeon: Jamesetta Geralds, MD;  Location: BRONCH PROCEDURE LAB [facility];  Service: Pulmonary    PR BRONCHOSCOPY,DIAGNOSTIC W LAVAGE Bilateral 09/19/2016    Procedure: BRONCHOSCOPY, RIGID OR FLEXIBLE, INCLUDE FLUOROSCOPIC GUIDANCE WHEN PERFORMED; W/BRONCHIAL ALVEOLAR LAVAGE WITH MODERATE SEDATION;  Surgeon: Blair Dolphin, MD;  Location: BRONCH PROCEDURE LAB [facility];  Service: Pulmonary    PR BRONCHOSCOPY,DIAGNOSTIC W LAVAGE Bilateral 04/15/2017    Procedure: BRONCHOSCOPY, RIGID OR FLEXIBLE, INCLUDE FLUOROSCOPIC GUIDANCE WHEN PERFORMED; W/BRONCHIAL ALVEOLAR LAVAGE WITH MODERATE SEDATION;  Surgeon: Blair Dolphin, MD;  Location: BRONCH PROCEDURE LAB [facility];  Service: Pulmonary    PR BRONCHOSCOPY,DIAGNOSTIC W LAVAGE Bilateral 02/11/2018    Procedure: BRONCHOSCOPY, RIGID OR FLEXIBLE, INCLUDE FLUOROSCOPIC GUIDANCE WHEN PERFORMED; W/BRONCHIAL ALVEOLAR LAVAGE WITH MODERATE SEDATION;  Surgeon: Jamesetta Geralds, MD;  Location: BRONCH PROCEDURE LAB [facility];  Service: Pulmonary    PR BRONCHOSCOPY,DIAGNOSTIC W LAVAGE Bilateral 03/27/2018    Procedure: BRONCHOSCOPY, RIGID OR FLEXIBLE, INCLUDE FLUOROSCOPIC GUIDANCE WHEN PERFORMED; W/BRONCHIAL ALVEOLAR LAVAGE WITH MODERATE SEDATION;  Surgeon: Jamesetta Geralds, MD;  Location: BRONCH PROCEDURE LAB [facility];  Service: Pulmonary    PR BRONCHOSCOPY,DIAGNOSTIC W LAVAGE Bilateral 01/13/2021    Procedure: BRONCHOSCOPY, RIGID OR FLEXIBLE, INCLUDE FLUOROSCOPIC GUIDANCE WHEN PERFORMED; W/BRONCHIAL ALVEOLAR LAVAGE;  Surgeon: Blair Dolphin, MD;  Location: BRONCH PROCEDURE LAB [facility];  Service: Pulmonary    PR BRONCHOSCOPY,DIAGNOSTIC W LAVAGE Bilateral 12/21/2021    Procedure: BRONCHOSCOPY, RIGID OR FLEXIBLE, INCLUDE FLUOROSCOPIC GUIDANCE WHEN PERFORMED; W/BRONCHIAL ALVEOLAR LAVAGE WITH MODERATE SEDATION;  Surgeon: Blair Dolphin, MD;  Location: BRONCH PROCEDURE LAB [facility];  Service: Pulmonary    PR BRONCHOSCOPY,TRANSBRONCH BIOPSY N/A 03/02/2015    Procedure: BRONCHOSCOPY, RIGID/FLEXIBLE, INCLUDE FLUORO GUIDANCE WHEN PERFORMED; W/TRANSBRONCHIAL LUNG BX, SINGLE LOBE With Moderate Sedation.;  Surgeon: Blair Dolphin, MD;  Location: BRONCH PROCEDURE LAB [facility];  Service: Pulmonary    PR BRONCHOSCOPY,TRANSBRONCH BIOPSY N/A 05/24/2015    Procedure: BRONCHOSCOPY, RIGID/FLEXIBLE, INCLUDE FLUORO GUIDANCE WHEN PERFORMED; W/TRANSBRONCHIAL LUNG BX, SINGLE LOBE WITH MODERATE SEDATION;  Surgeon: Blair Dolphin, MD;  Location: BRONCH PROCEDURE LAB [facility];  Service: Pulmonary    PR BRONCHOSCOPY,TRANSBRONCH BIOPSY N/A 08/23/2015    Procedure: BRONCHOSCOPY, FLEXIBLE, INCLUDE FLUORO GUIDANCE WHEN PERFORMED; W/TRANSBRONCHIAL LUNG BX, SINGLE LOBE WITH MODERATE SEDATION;  Surgeon: Jamesetta Geralds, MD;  Location: BRONCH PROCEDURE LAB [facility];  Service: Pulmonary    PR BRONCHOSCOPY,TRANSBRONCH BIOPSY N/A 12/15/2015    Procedure: BRONCHOSCOPY, RIGID/FLEXIBLE, INCLUDE FLUORO GUIDANCE WHEN PERFORMED; W/TRANSBRONCHIAL LUNG BX, SINGLE LOBE WITH MODERATE SEDATION;  Surgeon: Jamesetta Geralds, MD;  Location: BRONCH PROCEDURE LAB [facility];  Service: Pulmonary    PR BRONCHOSCOPY,TRANSBRONCH BIOPSY N/A 09/19/2016    Procedure: BRONCHOSCOPY, RIGID/FLEXIBLE, INCLUDE FLUORO GUIDANCE WHEN PERFORMED; W/TRANSBRONCHIAL LUNG BX, SINGLE LOBE WITH MODERATE SEDATION;  Surgeon: Blair Dolphin, MD;  Location: BRONCH PROCEDURE LAB [facility];  Service: Pulmonary    PR BRONCHOSCOPY,TRANSBRONCH BIOPSY N/A 04/15/2017    Procedure: BRONCHOSCOPY, RIGID/FLEXIBLE, INCLUDE FLUORO GUIDANCE WHEN PERFORMED; W/TRANSBRONCHIAL LUNG BX, SINGLE LOBE WITH MODERATE SEDATION;  Surgeon: Blair Dolphin, MD;  Location: BRONCH PROCEDURE LAB [facility];  Service: Pulmonary    PR BRONCHOSCOPY,TRANSBRONCH BIOPSY N/A 02/11/2018    Procedure: BRONCHOSCOPY, RIGID/FLEXIBLE, INCLUDE FLUORO GUIDANCE WHEN PERFORMED; W/TRANSBRONCHIAL LUNG BX, SINGLE LOBE WITH MODERATE SEDATION;  Surgeon: Jamesetta Geralds, MD;  Location: BRONCH PROCEDURE LAB [facility];  Service: Pulmonary    PR BRONCHOSCOPY,TRANSBRONCH BIOPSY N/A 01/13/2021    Procedure: BRONCHOSCOPY, RIGID/FLEXIBLE, INCLUDE FLUORO GUIDANCE WHEN PERFORMED; W/TRANSBRONCHIAL  LUNG BX, SINGLE LOBE WITH MODERATE SEDATION;  Surgeon: Blair Dolphin, MD;  Location: BRONCH PROCEDURE LAB [facility];  Service: Pulmonary    PR BRONCHOSCOPY,TRANSBRONCH BIOPSY N/A 12/21/2021    Procedure: BRONCHOSCOPY, RIGID/FLEXIBLE, INCLUDE FLUORO GUIDANCE WHEN PERFORMED; W/TRANSBRONCHIAL LUNG BX, SINGLE LOBE WITH MODERATE SEDATION;  Surgeon: Blair Dolphin, MD;  Location: BRONCH PROCEDURE LAB [facility];  Service: Pulmonary    PR COLONOSCOPY FLX DX W/COLLJ SPEC WHEN PFRMD N/A 11/18/2020    Procedure: COLONOSCOPY, FLEXIBLE, PROXIMAL TO SPLENIC FLEXURE; DIAGNOSTIC, W/WO COLLECTION SPECIMEN BY BRUSH OR WASH;  Surgeon: Leland Her, MD;  Location: [facility] PROCEDURES [facility];  Service: Gastroenterology    PR COLSC FLX W/RMVL OF TUMOR POLYP LESION SNARE TQ N/A 09/10/2014    Procedure: COLONOSCOPY FLEX; W/REMOV TUMOR/LES BY SNARE;  Surgeon: Tish Men, MD;  Location: [facility] PROCEDURES [facility]; Service: Gastroenterology    PR CREATE EARDRUM OPENING,GEN ANESTH Bilateral 09/13/2016    Procedure: TYMPANOSTOMY (REQUIRING INSERTION OF VENTILATING TUBE), GENERAL ANESTHESIA;  Surgeon: Freddy Finner, MD;  Location: MAIN OR [facility];  Service: ENT    PR ESOPHAGEAL MOTILITY STUDY, MANOMETRY N/A 05/27/2014    Procedure: ESOPHAGEAL MOTILITY STUDY W/INT & REP;  Surgeon: Nurse-Based Giproc;  Location: GI PROCEDURES [facility];  Service: Gastroenterology    PR ESOPHAGEAL MOTILITY STUDY, MANOMETRY N/A 04/26/2015    Procedure: ESOPHAGEAL MOTILITY STUDY W/INT & REP;  Surgeon: Nurse-Based Giproc;  Location: GI PROCEDURES [facility];  Service: Gastroenterology    PR EXPLOR POSTOP BLEED,INFEC,CLOT-CHST Bilateral 02/02/2015    Procedure: EXPLOR POSTOP HEMORR THROMBOSIS/INFEC; CHEST;  Surgeon: Evert Kohl, MD;  Location: MAIN OR [facility];  Service: Cardiothoracic    PR GERD TST W/ MUCOS IMPEDE ELECTROD,>1HR N/A 05/27/2014    Procedure: ESOPHAGEAL FUNCTION TEST, GASTROESOPHAGEAL REFLUX TEST W/ NASAL CATHETER INTRALUMINAL IMPEDANCE ELECTRODE(S) PLACEMENT, RECORDING, ANALYSIS AND INTERPRETATION; PROLONGED;  Surgeon: Nurse-Based Giproc;  Location: GI PROCEDURES [facility];  Service: Gastroenterology    PR GERD TST W/ MUCOS IMPEDE ELECTROD,>1HR N/A 04/26/2015    Procedure: ESOPHAGEAL FUNCTION TEST, GASTROESOPHAGEAL REFLUX TEST W/ NASAL CATHETER INTRALUMINAL IMPEDANCE ELECTRODE(S) PLACEMENT, RECORDING, ANALYSIS AND INTERPRETATION; PROLONGED;  Surgeon: Nurse-Based Giproc;  Location: GI PROCEDURES [facility];  Service: Gastroenterology    PR LUNG TRANSPLANT,DBL W CP BYPASS Bilateral 02/01/2015    Procedure: LUNG TRANSPL DBL; Ella Jubilee BYPASS;  Surgeon: Evert Kohl, MD;  Location: MAIN OR [facility];  Service: Cardiothoracic    PR MICROSURG TECHNIQUES,REQ OPER MICROSCOPE N/A 09/13/2016    Procedure: MICROSURGICAL TECHNIQUES, REQUIRING USE OF OPERATING MICROSCOPE (LIST SEPARATELY IN ADDITION TO CODE FOR PRIMARY PROCEDURE);  Surgeon: Freddy Finner, MD;  Location: MAIN OR [facility];  Service: ENT    PR NASAL/SINUS ENDOSCOPY,REMV TISS SPHENOID Bilateral 12/23/2015    Procedure: NASAL/SINUS ENDOSCOPY, SURGICAL, WITH SPHENOIDOTOMY; WITH REMOVAL OF TISSUE FROM THE SPHENOID SINUS;  Surgeon: Linde Gillis Senior, MD;  Location: MAIN OR [facility];  Service: ENT    PR NASAL/SINUS ENDOSCOPY,RMV TISS MAXILL SINUS Bilateral 12/23/2015    Procedure: NASAL/SINUS ENDOSCOPY, SURGICAL WITH MAXILLARY ANTROSTOMY; WITH REMOVAL OF TISSUE FROM MAXILLARY SINUS;  Surgeon: Linde Gillis Senior, MD;  Location: MAIN OR [facility];  Service: ENT    PR NASAL/SINUS NDSC W/RMVL TISS FROM FRONTAL SINUS Bilateral 12/23/2015    Procedure: NASAL/SINUS ENDOSCOPY, SURGICAL WITH FRONTAL SINUS EXPLORATION, W/WO REMOVAL OF TISSUE FROM FRONTAL SINUS;  Surgeon: Linde Gillis Senior, MD;  Location: MAIN OR [facility];  Service: ENT    PR NASAL/SINUS NDSC W/TOTAL ETHOIDECTOMY Bilateral 12/23/2015    Procedure: NASAL/SINUS ENDOSCOPY,  SURGICAL; WITH ETHMOIDECTOMY, TOTAL (ANTERIOR AND POSTERIOR);  Surgeon: Linde Gillis Senior, MD;  Location: MAIN OR [facility];  Service: ENT    PR STEREOTACTIC COMP ASSIST PROC,CRANIAL,EXTRADURAL N/A 12/23/2015    Procedure: STEREOTACTIC COMPUTER-ASSISTED (NAVIGATIONAL) PROCEDURE; CRANIAL, EXTRADURAL;  Surgeon: Linde Gillis Senior, MD;  Location: MAIN OR [place];  Service: ENT    PR XCAPSL CTRC RMVL INSJ IO LENS PROSTH W/O ECP Right 03/15/2016    Procedure: EXTRACAPSULAR CATARACT REMOVAL W/INSERTION OF INTRAOCULAR LENS PROSTHESIS, MANUAL OR MECHANICAL TECHNIQUE;  Surgeon: Arville Care, MD;  Location: [facility] OR [facility];  Service: Ophthalmology    PR XCAPSL CTRC RMVL INSJ IO LENS PROSTH W/O ECP Left 04/26/2016    Procedure: EXTRACAPSULAR CATARACT REMOVAL W/INSERTION OF INTRAOCULAR LENS PROSTHESIS, MANUAL OR MECHANICAL TECHNIQUE;  Surgeon: Arville Care, MD;  Location: [facility] OR [facility];  Service: Ophthalmology    SKIN BIOPSY         No current facility-administered medications for this encounter.    Current Outpatient Medications:     acetaminophen (TYLENOL) 325 MG tablet, Take 2 tablets (650 mg total) by mouth every four (4) hours as needed (headache)., Disp: , Rfl:     albuterol 2.5 mg /3 mL (0.083 %) nebulizer solution, Inhale 3 mL (2.5 mg total) by nebulization every four (4) hours as needed for wheezing., Disp: 225 mL, Rfl: 3    albuterol HFA 90 mcg/actuation inhaler, Inhale 2 puffs every six (6) hours as needed for wheezing., Disp: 8.5 g, Rfl: 11    allopurinol (ZYLOPRIM) 100 MG tablet, Take 1.5 tablets (150 mg total) by mouth daily., Disp: 135 tablet, Rfl: 3    amikacin (AMIKIN) 500 mg/2 mL injection, MIX 0.5 ML IN OF WATER AND USE IN NETI POT. DO THIS TWICE DAILY FOR EACH NOSTRIL. DISCARD VIAL AFTER USE (DO NOT SAVE REMAINING FOR LATER USE), Disp: 120 mL, Rfl: 11    aspirin 81 MG chewable tablet, Chew 1 tablet (81 mg total) daily., Disp: 30 tablet, Rfl: 0    atorvastatin (LIPITOR) 20 MG tablet, Take 1 tablet (20 mg total) by mouth once daily in the evening., Disp: 30 tablet, Rfl: 11    azithromycin (ZITHROMAX) 250 MG tablet, Take 1 tablet (250 mg total) by mouth daily., Disp: 90 tablet, Rfl: 3    aztreonam lysine (CAYSTON) 75 mg/mL Nebu nebulization solution, INHALE 1 VIAL (75 MG) VIA ALTERA NEBULIZER THREE TIMES DAILY CYCLING FOR 21 DAYS ON AND 21 DAYS OFF Strength: 75 mg/mL, Disp: 84 mL, Rfl: 4    budesonide-formoterol (SYMBICORT) 160-4.5 mcg/actuation inhaler, Inhale 2 puffs Two (2) times a day., Disp: 10.2 g, Rfl: 11    CALCIUM ORAL, Take 1 gum by mouth two (2) times a day., Disp: , Rfl:     cholecalciferol, vitamin D3, (VITAMIN D3 ORAL), Take 2 gum by mouth two (2) times a day., Disp: , Rfl:     filgrastim-aafi (NIVESTYM) 300 mcg/0.5 mL Syrg injection syringe, Inject 0.5 mL (300 mcg total) under the skin daily for 3 days., Disp: 1.5 mL, Rfl: 0    fluticasone propionate (FLONASE) 50 mcg/actuation nasal spray, 2 sprays into each nostril two (2) times a day., Disp: , Rfl:     furosemide (LASIX) 40 MG tablet, Take 1 tablet (40 mg total) by mouth two (2) times a day., Disp: 60 tablet, Rfl: 5    magnesium oxide (MAG-OX) 400 mg (241.3 mg magnesium) tablet, Take 1 tablet (400 mg total) by mouth two (2) times a day., Disp: , Rfl:  metoPROLOL tartrate (LOPRESSOR) 25 MG tablet, Take 1 tablet (25 mg total) by mouth every four (4) hours as needed (for SVT, HR>150)., Disp: 30 tablet, Rfl: 0    montelukast (SINGULAIR) 10 mg tablet, Take 1 tablet (10 mg total) by mouth nightly., Disp: 90 tablet, Rfl: 3    multivitamin,tx-minerals Tab, Take 1 tablet by mouth., Disp: , Rfl:     mycophenolate (CELLCEPT) 250 mg capsule, Take 1 capsule (250 mg total) by mouth daily., Disp: 90 capsule, Rfl: 0    pantoprazole (PROTONIX) 40 MG tablet, Take 1 tablet (40 mg total) by mouth two (2) times a day., Disp: 180 tablet, Rfl: 3    predniSONE (DELTASONE) 2.5 MG tablet, Take 1 tablet (2.5 mg total) by mouth in the morning. Take with 5mg  tablet for total dose of 7.5mg  daily., Disp: 90 tablet, Rfl: 3    predniSONE (DELTASONE) 5 MG tablet, Take 1 tablet by mouth daily (take with 2.5mg  tablet for a total daily dose of 7.5 MG), Disp: 90 tablet, Rfl: 3    SITagliptin phosphate (JANUVIA) 50 MG tablet, Take 1 tablet (50 mg total) by mouth daily., Disp: 90 tablet, Rfl: 3    sulfamethoxazole-trimethoprim (BACTRIM) 400-80 mg per tablet, Take 1 tablet (80 mg of trimethoprim total) by mouth Every Monday, Wednesday, and Friday., Disp: 36 tablet, Rfl: 3    syringe with needle (BD LUER-LOK SYRINGE) 3 mL 20 gauge x 1 1/2 Syrg, Use as directed to withdraw Amikacin, Disp: 510 each, Rfl: 0    syringe with needle (BD LUER-LOK SYRINGE) 3 mL 20 gauge x 1 Syrg, Use as directed to withdraw Amikacin, Disp: 60 each, Rfl: 9    syringe with needle (BD LUER-LOK SYRINGE) 3 mL 21 gauge x 1 Syrg, Use as directed, Disp: 60 each, Rfl: 8    syringe with needle (BD LUER-LOK SYRINGE) 3 mL 25 x 5/8 Syrg, Use as directed with amikacin twice daily, Disp: 182 each, Rfl: 0    syringe with needle (SYRINGE 3CC/21GX1) 3 mL 21 gauge x 1 Syrg, Use as directed, Disp: 1028 each, Rfl: 0    tacrolimus (PROGRAF) 1 MG capsule, Take 1 capsule (1 mg total) by mouth nightly. Take with one 5mg  capsule (total of 6mg  nightly), Disp: 90 capsule, Rfl: 3    tacrolimus (PROGRAF) 5 MG capsule, Take 1 capsule (5 mg total) by mouth two (2) times a day. Take with one 1mg  capsule nightly (total of 6mg  nightly), Disp: 180 capsule, Rfl: 3    valGANciclovir (VALCYTE) 450 mg tablet, Take 1 tablet (450 mg total) by mouth daily., Disp: 90 tablet, Rfl: 3    valsartan (DIOVAN) 40 MG tablet, Take 1 tablet (40 mg total) by mouth daily., Disp: 30 tablet, Rfl: 11    Allergies  Cefepime; Cephalosporins; Piperacillin-tazobactam; Iodinated contrast media; Adhesive; Adhesive tape-silicones; Chlorhexidine; Latex, natural rubber; Meropenem; and Tegaderm adhesive-no drug-allergy check    Family History  Family History   Problem Relation Age of Onset    Heart disease Father     Diabetes Other     Glaucoma Neg Hx     Melanoma Neg Hx     Basal cell carcinoma Neg Hx     Squamous cell carcinoma Neg Hx        Social History  Social History     Tobacco Use    Smoking status: Never     Passive exposure: Never    Smokeless tobacco: Never   Vaping Use    Vaping status: Never Used  Substance Use Topics    Alcohol use: Not Currently     Comment: once/year    Drug use: No        Physical Exam     VITAL SIGNS:      Vitals:    08/09/22 1347 08/09/22 1543 08/09/22 1718 08/09/22 1724   BP: 139/88 115/78 134/84 134/84   Pulse: 88 83 75 76   Resp: 18 20 20 18    Temp: 36.7 ??C (98 ??F) 36.7 ??C (98.1 ??F)  36.7 ??C (98.1 ??F)   TempSrc: Oral Oral     SpO2: 97% 95% 97% 98%     Constitutional: Alert and oriented. No acute distress.  Eyes: Conjunctivae are normal.  HEENT: Normocephalic and atraumatic. Conjunctivae clear. No congestion. Moist mucous membranes.   Cardiovascular: Rate as above, regular rhythm. Normal and symmetric distal pulses. Brisk capillary refill. Normal skin turgor.  Respiratory: Normal respiratory effort. Breath sounds are normal. There are no wheezing or crackles heard.  Gastrointestinal: Soft, non-distended, non-tender.  Genitourinary: Deferred.  Musculoskeletal: Non-tender with normal range of motion in all extremities.  Neurologic: Normal speech and language. No gross focal neurologic deficits are appreciated. Patient is moving all extremities equally, face is symmetric at rest and with speech.  Skin: Skin is warm, dry and intact. No rash noted.  Psychiatric: Mood and affect are normal. Speech and behavior are normal.     Radiology     XR Chest 2 views   Final Result      No acute findings.        Pertinent labs & imaging results that were available during my care of the patient were independently interpreted by me and considered in my medical decision making (see chart for details).    Portions of this record have been created using Scientist, clinical (histocompatibility and immunogenetics). Dictation errors have been sought, but may not have been identified and corrected.         Cheryle Horsfall, MD  Resident  08/09/22 [phone]

## 2022-08-09 NOTE — Unmapped (Signed)
Pulmonary Transplant Consult    HISTORY:   HPI:  62 y.o. y/o with  PCD  s/p BOLT on 02/01/15 here for c/o toe pain and swelling following PsA exacerbation s/p 2 weeks of IV mero, following up after recent discontinuation of voriconazole and transition from DS bactrim to ppx dosing on SS bactrim for treatment of PJP on bronch BAL and scedosporium from his maxillary sinus cx.     Interval history 08/09/22:  - Presented to [facility] ER today for tachycardia to 200s at home, [facility]  - Converted to NSR with adenosine PTA  - Asymptomatic here  - Endorses trying to cut down on caffeinated beverages, though states he has at least 3 diet pepsi's per day  - No chest pain    ROS:   - the balance of 10 systems is negative other than noted above     PAST MEDICAL HISTORY:   - Primary ciliary dyskinesia c/b bronchiectasis and chronic sinusitis s/p BOLT    --> Induction: Basiliximab    --> CMV: D+ / R-    --> EBV: D+ / R+    --> HLA status: No DSAs but anti-HLAs  - Chronic sinusitis s/p FESS 12/23/2015       Past surgical history  Appendectomy  Sinus surgery  BOLT  Hernia repair     Medications and Allergies: Reviewed in EMR, pertinent findings noted in assessment and plan     Other History: The social history and family history were personally reviewed and updated in the patient's electronic medical record.      OBJECTIVE DATA:   PHYSICAL EXAM:    BP 139/88  - Pulse 88  - Temp 36.7 ??C (98 ??F) (Oral)  - Resp 18  - SpO2 97%     Gen: - awake, alert, in NAD    HEENT: - no adenopathy, TMs clear   CV: - RRR, no murmers or gallops   Pulm: - CTA bilaterally, comfortable   Ext: - +3 pitting LE edema, no clubbing       LABS: (reviewd in Epic, pertinent values noted below)     PFTs:     Date: FVC (% Pred) FEV1 (% Pred) FEV1/FVC FEF25-75(% Pred) TBBx/Results           06/29/22     Deferred   05/25/22 3.43 (85%) 2.57 (81%)  1.93 (71%)    01/12/22 3.73 (92%) 2.98 (94%)  3.10 (113%)    12/19/21 3.54 (81%) 2.90 (85.1%)  3.28 (113.1%)    11/21/21 3.88 (88.7%) 3.21 (94.1%)  3.54 (121.8%)    08/10/21 3.69 (84%) 3.03 (89%)       02/21/2021 3.72 (84.6%) 3.08 (89.8%)   3.40 (115.3%)       01/12/2021 3.6 (81.6%) 2.82 (82%)   2.4   (81.4%)     12/23/20 3.78 (85.6%) 3.12 (90.6%)   3.39 (114.5%)     08/24/20 3.87 (87.5%) 3.15 (91.4%)   3.34 (112.3%     02/19/20 3.87 (87.1%) 3.05 (87.9%)   2.80 (93.2%)     01/27/20 3.94 (86.2%) 3.13 (88.1%)   3.02 (98.3%)     11/27/19 3.99 (89.8%) 3.10 (89.1%)   2.74 (90.8%)     10/02/19 4.05 (88.6%) 3.23 (90.5%)   2.99 (96.8%)     08/14/19 3.67 (81%) 2.93 (82%)   2.74 (88.4%)     05/15/2019 4.14 (90.2%) 3.30 (92.1%)   3.12 (100.3%)     02/25/2019 4.05 (86.1%) 3.28 (91.2%)   3.15 (102.5%)  11/10/2018 4.20 (89.1%) 3.37 (93.7%)   3.26 (106.1%)      08/26/18  4.01  3.14    2.79      03/07/2018 3.70 (78%)  3.01 (83%)   3.11 (100%)     02/12/2019 2.92 (80%) 3.56 (80%)   3.10 (99%)     02/03/18 3.79 (80%) 2.96 (81.4%)   2.50 (80.2%)     01/06/2018 3.67 (77.4%) 2.87 (78.9%)   2.56 (82%)     10/03/17 3.88 (81.5%) 3.10 (84.5%)   2.96 (93.3%)     09/20/2017 3.58 (75%) 2.89 (79%)   2.97 (94%)     06/28/17 4.07 (85%)  3.50 (97%)    4.79 (151%)      04/08/17 3.5 (73.5%) 2.82 (77%)   2.96 (92%) A0Bx 04/2017   03/11/17 3.67 (77%) 3.03 (83%)   83  3.21 (101%)     02/21/16 3.79 (79%) 3.13 (85%)   3.41 (108%)     12/2016 3.83 (81%) 3.18 (88%)   3.48 (111%)      10/16/16 3.82 (80%)  3.06 (84%)  80  2.85 (90%)       09/18/16 3.60 (75%) 2.86 (77%)  80 2.60 (81%) A0Bx     08/28/16 3.78 (81%)  3.07 (85%)   81 3.02 (95%)     06/21/16 3.89 .13   2.90     06/15/16 3.82 (82%) 3.15 (88%)   3.30 (105%)      05/19/16  3.83 (82%)  3.15 (88%)    3.11 (99%)     04/13/16 4.02 (85%)  3.23 (89%)   3.08 (97%)     03/23/16 3.84 (82.3%) 3.20 (89.0%)   3.30 (104.6%)     02/17/16 4.00 (85.7%) 3.34 (92.7%) 83 3.39 (107.6%)     01/13/2016 3.78 3.18 84 3.21 A0B0 12/15/15:    11/23/2015 4.08 (87.55) 3.40 (94.5%)   3.45 (109.4%)     11/10/15 3.96 (85%) 3.26 (90.4%)   3.20 (101.5%)     11/08/15 3.63 (77.9%) 2.94 (81.7%)   2.89 (91.8%) Sick visit   10/28/2015 4.04 (86.6%) 3.40 (94.5%) 84 3.88 (123.1%)     09/30/2015 4.12 (87.8%) 3.48 (96%) 85 4.00 (125.1%)     08/23/15 4.03 (85%) 3.43 (94%) 85 3.78 (118%) A0Bx   07/06/15 3.99 (85%)  3.48 (96%)    4.23 (132%)     06/24/15 4.01 (86.7%) 3.41 (95.3%) 85 3.83 (120.9%)     06/17/15 4.15 (88.4%) 3.52 (97.1%) 85 3.95 (123.4%) A0B0   05/06/15 3.96 (82.1) 3.51 (94.5) 89 4.45 (136.2)      04/22/15  3.92 (81%)  3.39 (91%)    4.12 (126%)     April 08 2015 3.81 (79.1%) 3.34 (89.7%) 88 4.33 (132.4%)     March 25, 2015 3.54 (73.3%) 3.19 ( 85.5%) 90 4.47(136.6%)      March 11, 2015 3.60 (74.7%) 3.16 (84.8%) 88 3.63 (111.0%) A0Bx   February 23, 2015 2.97 (61.6%) 2.59 (69.5%) 87 3.31 (101%)            IMAGING: (Personally reviewed, our interpretation is below)  CXR 01/12/2021 looks unchanged, no infiltrate seen.    ASSESSMENT and PLAN   Willie Snyder is a 62 y.o. male with h/o PCD s/p BOLT 02/01/15, Stage 3b CKD, GERD, hypogammaglobinemia, gout, and OSA that presented to [facility] with positive PJP PCR via recent BAL/Tbbx on 12/21/21 and positive scedosporium growth from sinus culture 12/19/21, here today for clinic follow up.    SVT:   Converted to NSR with adenosine via  EMS. No chest pain/pressure and is now asymptomatic. Encourage to decrease caffeine intake.   - Agree with BMP, Mg, TSH  - Barring any major lab abnormalities, dispo per primary ER team    Graft Function and Immunosuppression:  - PFTs deferred today for sick visit, CXR has been stable, pt feels great from a pulmonary standpoint.  - RPP negative at last visit apart from rhino (chronic shedder)  - S/p pred 40mg  x3 days, consulted with ICID and appreciate their input.  - S/p IV mero 2g Q12hrs 10-12/2021.  - 12/2017 CT chest with air trapping suggestive of BOS, but no focal consolidation               - 02/09/18 Bronch A0B0, 50% lymphs on paucicellular BAL  - 02/21/2021 CT chest with resolution of nodules, CT 12/19/21 with no acute abnormality, no evidence of pneumonia.  - bronch TBBx 11/9 to look for e/o ACR iso being off MMF   - No DSAs but positive non-DSA anti-HLAs.  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  - Treated most recently with IV meropenem 10/10-10/24/23, 11/7 imipenem/cilastin/relebactam--> 11/8 imipenem/cilastin/relebactam + inhaled amikacin, systemic abx stopped 11/10   - will get sinus culture today and MADD order so we have expanded sensitivities for the next time he is sick.  - Follows with ENT  - Home amikacin sinus rinses.  -  Inhaled cayston 3 weeks on 3 weeks off  - Nasonex twice daily  - Neti-pot with JJ baby wash TID  - s/p Sinus endoscopy with ethmoidectomy antrostomy and tissue removal  - Voriconzole initiated at 200mg  BID for 3 months (mid feb) and patients tacrolimus dose was titrated to 0.5mg  daily, subsequently titrated to 250mg  BID on 01/02/22 --> 300mg  BID (01/12/22).  - Last vori level 2.8 (04/06/22), now OFF vori  - CT sinus 05/23/22 with:  Impression   --Overall similar appearing diffuse paranasal sinus opacification and mucosal thickening on a background of prior functional endoscopic sinus surgery. No areas of new osseous erosion or significant soft tissue inflammatory changes outside of the sinuses to suggest invasive fungal disease or complicated sinusitis.   - OFF voriconazole    Bronchiolitis obliterans w/ organizing pneumonia  Bronchoscopy 12/2021 iso of cough, dyspnea, hypoxia subsequently found to have PJP. However prior to that result TBBx came back with BOOP iso of cellcept being held for cytopenia.   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 Drop in FEV1 and FEF 25-75%. CT chest with LUL nodularity. Bronchoscopy 12/2 with RSV positive, and pseudomonas non aeruginosa species from RML.   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 Minimal oxygen requirements, SpO2 92% at rest on our review.     - back to baseline dose of prednisone 7.5mg  after pred bump and taper 40mg , decreased by 10mg  every 5 days.     CT chest LUL nodularity 01/2021  - bronch cx c/w pan-sensitive PsA  - fu CT chest completed 02/20/2021, resolution of nodules.     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    Gout - Continue allopurinol, prn colcrys  - Increase allopurinol to 150mg  daily    Elevated ASCVD Risk - Continue asa and statin     Bone health:  - Continue ca/vit D  - Last vit D 58.5 (11/2021)  - DEXA 06/2022 low bone density, received reclast 06/29/22      Health Maintenance:   - Last derm visit:                     Summer 2023  - Last Colonsocopy (if >40):   Next due 11/2023  - Influenza vaccine:                 booster 01/12/2021, 11/21/21, 01/12/22  - Prevnar:                                09/24/2013  - Last Pneumovax:                  02/14/2007, 12/28/2015, prevnar 20 02/2021  - Last Dexa:  06/22/22 with osteopenia, giving reclast 06/29/22  - Last vitamin D:                     58.5 (11/2021)  - Last HbA1c:                          6.6 (05/2022)  - Tdap:                                     02/2011, 02/23/21  - Shingrix:                                10/2016, 09/2017  - COVID:                                 03/2019, 04/2019 (Pfizer), 09/13/19, 02/2020, 08/20/20, 11/27/20 (bivalent), spikevax 11/21/21, 05/09/22  - COVID PPX: Evusheld 02/2020, 08/24/2020.  - RSV    01/22/22             Immunization History   Administered Date(s) Administered    COVID-19 VAC,IM,BV(97YR UP)BOOST,PFIZER 11/27/2020    COVID-19 VAC,IM,BV(5-89yr)BOOST,PFIZER 11/27/2020    COVID-19 VACC,MRNA,(PFIZER)(PF)(IM) 04/11/2019, 05/02/2019, 09/13/2019, 02/22/2020, 08/20/2020    INFLUENZA INJ MDCK PF, QUAD,(FLUCELVAX)(15MO AND UP EGG FREE) 12/21/2018    INFLUENZA TIV (TRI) PF (IM) 03/03/2011, 11/13/2011    Influenza Vaccine Quad (IIV4 PF) 37mo+ injectable 10/27/2013, 10/28/2015, 12/14/2016, 03/12/2017, 11/22/2017, 01/06/2018, 11/11/2018, 11/27/2019    Influenza Virus Vaccine, unspecified formulation 11/13/2014, 11/27/2020    PNEUMOCOCCAL POLYSACCHARIDE 23 02/14/2007, 12/28/2015    PPD Test 12/04/2010, 12/11/2010, 05/11/2014, 05/11/2014    Pneumococcal Conjugate 13-Valent 09/24/2013    SHINGRIX-ZOSTER VACCINE (HZV), RECOMBINANT,SUB-UNIT,ADJUVANTED IM 10/16/2016, 09/20/2017    TdaP 03/03/2011       >60min was spent with the patient face to face and >47min was spent reviewing chart/imaging.     Complex medical decision making was done as we adjust medications based on blood work/drug levels and multiple complaints and problems were addressed    The patient was assessed and discussed with Dr. Glenard Haring who is in agreement with plan.

## 2022-08-10 ENCOUNTER — Ambulatory Visit: Admit: 2022-08-10 | Discharge: 2022-08-11 | Payer: PRIVATE HEALTH INSURANCE

## 2022-08-10 LAB — CBC W/ AUTO DIFF
BASOPHILS ABSOLUTE COUNT: 0.1 10*9/L (ref 0.0–0.1)
BASOPHILS RELATIVE PERCENT: 0.8 %
EOSINOPHILS ABSOLUTE COUNT: 0.3 10*9/L (ref 0.0–0.5)
EOSINOPHILS RELATIVE PERCENT: 4 %
HEMATOCRIT: 34.4 % — ABNORMAL LOW (ref 39.0–48.0)
HEMOGLOBIN: 11.1 g/dL — ABNORMAL LOW (ref 12.9–16.5)
LYMPHOCYTES ABSOLUTE COUNT: 3.3 10*9/L (ref 1.1–3.6)
LYMPHOCYTES RELATIVE PERCENT: 38.2 %
MEAN CORPUSCULAR HEMOGLOBIN CONC: 32.3 g/dL (ref 32.0–36.0)
MEAN CORPUSCULAR HEMOGLOBIN: 28.8 pg (ref 25.9–32.4)
MEAN CORPUSCULAR VOLUME: 89.1 fL (ref 77.6–95.7)
MEAN PLATELET VOLUME: 8.2 fL (ref 6.8–10.7)
MONOCYTES ABSOLUTE COUNT: 0.5 10*9/L (ref 0.3–0.8)
MONOCYTES RELATIVE PERCENT: 6.3 %
NEUTROPHILS ABSOLUTE COUNT: 4.4 10*9/L (ref 1.8–7.8)
NEUTROPHILS RELATIVE PERCENT: 50.7 %
PLATELET COUNT: 169 10*9/L (ref 150–450)
RED BLOOD CELL COUNT: 3.86 10*12/L — ABNORMAL LOW (ref 4.26–5.60)
RED CELL DISTRIBUTION WIDTH: 20.5 % — ABNORMAL HIGH (ref 12.2–15.2)
WBC ADJUSTED: 8.6 10*9/L (ref 3.6–11.2)

## 2022-08-10 LAB — COMPREHENSIVE METABOLIC PANEL
ALBUMIN: 3.5 g/dL (ref 3.4–5.0)
ALKALINE PHOSPHATASE: 79 U/L (ref 46–116)
ALT (SGPT): 30 U/L (ref 10–49)
ANION GAP: 6 mmol/L (ref 5–14)
AST (SGOT): 28 U/L (ref <=34)
BILIRUBIN TOTAL: 0.5 mg/dL (ref 0.3–1.2)
BLOOD UREA NITROGEN: 28 mg/dL — ABNORMAL HIGH (ref 9–23)
BUN / CREAT RATIO: 13
CALCIUM: 7.8 mg/dL — ABNORMAL LOW (ref 8.7–10.4)
CHLORIDE: 114 mmol/L — ABNORMAL HIGH (ref 98–107)
CO2: 23 mmol/L (ref 20.0–31.0)
CREATININE: 2.11 mg/dL — ABNORMAL HIGH
EGFR CKD-EPI (2021) MALE: 35 mL/min/{1.73_m2} — ABNORMAL LOW (ref >=60)
GLUCOSE RANDOM: 114 mg/dL (ref 70–179)
POTASSIUM: 4.3 mmol/L (ref 3.4–4.8)
PROTEIN TOTAL: 6.3 g/dL (ref 5.7–8.2)
SODIUM: 143 mmol/L (ref 135–145)

## 2022-08-10 LAB — TACROLIMUS LEVEL: TACROLIMUS BLOOD: 3.8 ng/mL

## 2022-08-10 MED ORDER — MYCOPHENOLATE MOFETIL 250 MG CAPSULE
ORAL_CAPSULE | Freq: Every day | ORAL | 0 refills | 90 days
Start: 2022-08-10 — End: 2023-08-10

## 2022-08-10 MED ORDER — FUROSEMIDE 40 MG TABLET
ORAL_TABLET | Freq: Two times a day (BID) | ORAL | 5 refills | 30 days
Start: 2022-08-10 — End: 2023-02-06

## 2022-08-10 MED ORDER — ATORVASTATIN 20 MG TABLET
ORAL_TABLET | Freq: Every day | ORAL | 11 refills | 30 days | Status: CP
Start: 2022-08-10 — End: 2023-08-10
  Filled 2022-08-10: qty 30, 30d supply, fill #0

## 2022-08-10 NOTE — Unmapped (Signed)
Called Willie Snyder to discuss lab results. Tacrolimus level was decreased at 3.8 with a goal of 4-6. Medication regimen is currently 5/6. Dosing at this time to remain the same as trough is 2hrs late. Repeat labs scheduled in 2 weeks. Pt is feeling better after receiving adenosine for SVT episode in the ER.  Toe is fine.  Resulted labs reviewed. All questions answered. Pt verbalized understanding.

## 2022-08-11 NOTE — Unmapped (Signed)
Send to tplt provider

## 2022-08-14 DIAGNOSIS — Z942 Lung transplant status: Principal | ICD-10-CM

## 2022-08-14 MED ORDER — MYCOPHENOLATE MOFETIL 250 MG CAPSULE
ORAL_CAPSULE | Freq: Every day | ORAL | 3 refills | 90 days | Status: CP
Start: 2022-08-14 — End: 2023-08-14
  Filled 2022-08-14: qty 30, 30d supply, fill #0

## 2022-08-14 MED ORDER — AZITHROMYCIN 250 MG TABLET
ORAL_TABLET | Freq: Every day | ORAL | 3 refills | 90 days | Status: CP
Start: 2022-08-14 — End: 2023-08-14
  Filled 2022-08-14: qty 90, 90d supply, fill #0

## 2022-08-14 MED FILL — SULFAMETHOXAZOLE 400 MG-TRIMETHOPRIM 80 MG TABLET: ORAL | 84 days supply | Qty: 36 | Fill #1

## 2022-08-17 DIAGNOSIS — Z942 Lung transplant status: Principal | ICD-10-CM

## 2022-08-17 DIAGNOSIS — D801 Nonfamilial hypogammaglobulinemia: Principal | ICD-10-CM

## 2022-08-20 NOTE — Unmapped (Signed)
Specialty Medication(s): tacrolimus, mycophenolate, valganciclovir    Willie Snyder has been dis-enrolled from the [facility] specialty pharmacy services due to a pharmacy change. The patient is now filling at [facility]. Marland Kitchen    Additional information provided to the patient: Patient fills all his meds at [facility]    Tera Helper, [facility]  [facility] Specialty Pharmacist

## 2022-08-24 ENCOUNTER — Ambulatory Visit: Admit: 2022-08-24 | Discharge: 2022-08-24 | Payer: PRIVATE HEALTH INSURANCE

## 2022-08-24 ENCOUNTER — Ambulatory Visit: Admit: 2022-08-24 | Discharge: 2022-08-24 | Payer: PRIVATE HEALTH INSURANCE | Attending: Family | Primary: Family

## 2022-08-24 DIAGNOSIS — Q348 Other specified congenital malformations of respiratory system: Principal | ICD-10-CM

## 2022-08-24 DIAGNOSIS — D801 Nonfamilial hypogammaglobulinemia: Principal | ICD-10-CM

## 2022-08-24 DIAGNOSIS — E1169 Type 2 diabetes mellitus with other specified complication: Principal | ICD-10-CM

## 2022-08-24 DIAGNOSIS — I270 Primary pulmonary hypertension: Principal | ICD-10-CM

## 2022-08-24 DIAGNOSIS — Z942 Lung transplant status: Principal | ICD-10-CM

## 2022-08-27 ENCOUNTER — Ambulatory Visit: Admit: 2022-08-27 | Discharge: 2022-08-28 | Payer: PRIVATE HEALTH INSURANCE

## 2022-08-28 DIAGNOSIS — Z942 Lung transplant status: Principal | ICD-10-CM

## 2022-08-28 DIAGNOSIS — R942 Abnormal results of pulmonary function studies: Principal | ICD-10-CM

## 2022-08-30 MED FILL — PANTOPRAZOLE 40 MG TABLET,DELAYED RELEASE: ORAL | 90 days supply | Qty: 180 | Fill #1

## 2022-08-30 MED FILL — TACROLIMUS 5 MG CAPSULE, IMMEDIATE-RELEASE: ORAL | 30 days supply | Qty: 60 | Fill #1

## 2022-08-31 ENCOUNTER — Ambulatory Visit: Admit: 2022-08-31 | Discharge: 2022-08-31 | Payer: PRIVATE HEALTH INSURANCE

## 2022-08-31 ENCOUNTER — Ambulatory Visit: Payer: PRIVATE HEALTH INSURANCE

## 2022-08-31 ENCOUNTER — Ambulatory Visit: Admit: 2022-08-31 | Discharge: 2022-09-01 | Payer: PRIVATE HEALTH INSURANCE

## 2022-09-03 MED ORDER — FUROSEMIDE 40 MG TABLET
ORAL_TABLET | Freq: Two times a day (BID) | ORAL | 5 refills | 30 days
Start: 2022-09-03 — End: 2023-03-02

## 2022-09-03 MED FILL — ALBUTEROL SULFATE HFA 90 MCG/ACTUATION AEROSOL INHALER: RESPIRATORY_TRACT | 25 days supply | Qty: 8.5 | Fill #4

## 2022-09-03 MED FILL — ALBUTEROL SULFATE 2.5 MG/3 ML (0.083 %) SOLUTION FOR NEBULIZATION: RESPIRATORY_TRACT | 15 days supply | Qty: 180 | Fill #3

## 2022-09-03 MED FILL — MONTELUKAST 10 MG TABLET: ORAL | 90 days supply | Qty: 90 | Fill #2

## 2022-09-03 MED FILL — TACROLIMUS 1 MG CAPSULE, IMMEDIATE-RELEASE: ORAL | 30 days supply | Qty: 30 | Fill #0

## 2022-09-03 MED FILL — BUDESONIDE-FORMOTEROL HFA 160 MCG-4.5 MCG/ACTUATION AEROSOL INHALER: RESPIRATORY_TRACT | 30 days supply | Qty: 10.2 | Fill #3

## 2022-09-04 MED ORDER — FUROSEMIDE 40 MG TABLET
ORAL_TABLET | Freq: Two times a day (BID) | ORAL | 5 refills | 30 days
Start: 2022-09-04 — End: 2023-03-03

## 2022-09-05 MED ORDER — FUROSEMIDE 40 MG TABLET
ORAL_TABLET | Freq: Two times a day (BID) | ORAL | 5 refills | 30 days | Status: CP
Start: 2022-09-05 — End: 2023-03-04
  Filled 2022-09-05: qty 60, 30d supply, fill #0

## 2022-09-15 DIAGNOSIS — Z942 Lung transplant status: Principal | ICD-10-CM

## 2022-09-15 DIAGNOSIS — D801 Nonfamilial hypogammaglobulinemia: Principal | ICD-10-CM

## 2022-09-17 DIAGNOSIS — Z942 Lung transplant status: Principal | ICD-10-CM

## 2022-09-18 ENCOUNTER — Ambulatory Visit
Admit: 2022-09-18 | Discharge: 2022-09-21 | Disposition: A | Discharge status: Home / Self Care | Payer: PRIVATE HEALTH INSURANCE | Source: Ambulatory Visit

## 2022-09-18 ENCOUNTER — Ambulatory Visit: Admit: 2022-09-18 | Discharge: 2022-09-19 | Payer: PRIVATE HEALTH INSURANCE

## 2022-09-21 DIAGNOSIS — M109 Gout, unspecified: Principal | ICD-10-CM

## 2022-09-21 DIAGNOSIS — Z942 Lung transplant status: Principal | ICD-10-CM

## 2022-09-21 MED ORDER — EPINEPHRINE 0.3 MG/0.3 ML INJECTION, AUTO-INJECTOR
Freq: Once | INTRAMUSCULAR | 1 refills | 0 days | Status: CP | PRN
Start: 2022-09-21 — End: ?
  Filled 2022-09-21: qty 2, 2d supply, fill #0

## 2022-09-23 ENCOUNTER — Ambulatory Visit
Admit: 2022-09-23 | Discharge: 2022-09-25 | Disposition: A | Discharge status: Home / Self Care | Payer: PRIVATE HEALTH INSURANCE | Admitting: Infectious Disease

## 2022-09-23 ENCOUNTER — Ambulatory Visit
Admit: 2022-09-23 | Discharge: 2022-09-25 | Disposition: A | Discharge status: Home / Self Care | Payer: PRIVATE HEALTH INSURANCE | Attending: Internal Medicine | Admitting: Infectious Disease

## 2022-09-25 MED ORDER — COLCHICINE 0.6 MG TABLET
ORAL_TABLET | Freq: Every day | ORAL | 0 refills | 30 days
Start: 2022-09-25 — End: ?

## 2022-09-26 MED ORDER — DOXYCYCLINE HYCLATE 100 MG TABLET
ORAL_TABLET | Freq: Two times a day (BID) | ORAL | 0 refills | 12 days | Status: CP
Start: 2022-09-26 — End: 2022-10-08
  Filled 2022-09-25: qty 24, 12d supply, fill #0

## 2022-10-05 ENCOUNTER — Telehealth
Admit: 2022-10-05 | Discharge: 2022-10-06 | Payer: PRIVATE HEALTH INSURANCE | Attending: Infectious Disease | Primary: Infectious Disease

## 2022-10-05 DIAGNOSIS — M7022 Olecranon bursitis, left elbow: Principal | ICD-10-CM

## 2022-10-05 MED ORDER — PREDNISONE 2.5 MG TABLET
ORAL_TABLET | Freq: Every day | ORAL | 3 refills | 90 days | Status: CP
Start: 2022-10-05 — End: ?
  Filled 2022-10-05: qty 90, 90d supply, fill #0

## 2022-10-05 MED FILL — TACROLIMUS 5 MG CAPSULE, IMMEDIATE-RELEASE: ORAL | 30 days supply | Qty: 60 | Fill #2

## 2022-10-05 MED FILL — TACROLIMUS 1 MG CAPSULE, IMMEDIATE-RELEASE: ORAL | 30 days supply | Qty: 30 | Fill #1

## 2022-10-05 MED FILL — VALSARTAN 40 MG TABLET: ORAL | 30 days supply | Qty: 30 | Fill #7

## 2022-10-05 MED FILL — MYCOPHENOLATE MOFETIL 250 MG CAPSULE: ORAL | 30 days supply | Qty: 30 | Fill #1

## 2022-10-05 MED FILL — JANUVIA 50 MG TABLET: ORAL | 90 days supply | Qty: 90 | Fill #2

## 2022-10-05 MED FILL — BUDESONIDE-FORMOTEROL HFA 160 MCG-4.5 MCG/ACTUATION AEROSOL INHALER: RESPIRATORY_TRACT | 30 days supply | Qty: 10.2 | Fill #4

## 2022-10-05 MED FILL — PREDNISONE 5 MG TABLET: 90 days supply | Qty: 90 | Fill #1

## 2022-10-05 MED FILL — ATORVASTATIN 20 MG TABLET: ORAL | 30 days supply | Qty: 30 | Fill #1

## 2022-10-05 MED FILL — ALBUTEROL SULFATE HFA 90 MCG/ACTUATION AEROSOL INHALER: RESPIRATORY_TRACT | 25 days supply | Qty: 8.5 | Fill #5

## 2022-10-17 DIAGNOSIS — M719 Bursopathy, unspecified: Principal | ICD-10-CM

## 2022-10-17 DIAGNOSIS — B259 Cytomegaloviral disease, unspecified: Principal | ICD-10-CM

## 2022-10-17 MED ORDER — VALGANCICLOVIR 450 MG TABLET
ORAL_TABLET | Freq: Every day | ORAL | 3 refills | 90 days | Status: CP
Start: 2022-10-17 — End: 2023-10-17
  Filled 2022-10-17: qty 90, 90d supply, fill #0

## 2022-10-17 MED ORDER — COLCHICINE 0.6 MG TABLET
ORAL_TABLET | Freq: Every day | ORAL | 11 refills | 0.00000 days | Status: CP
Start: 2022-10-17 — End: 2022-10-21

## 2022-10-17 MED FILL — SULFAMETHOXAZOLE 400 MG-TRIMETHOPRIM 80 MG TABLET: ORAL | 84 days supply | Qty: 36 | Fill #2

## 2022-10-18 MED ORDER — COLCHICINE 0.6 MG TABLET
ORAL_TABLET | Freq: Every day | ORAL | 0 refills | 30 days | Status: CP
Start: 2022-10-18 — End: 2022-10-18
  Filled 2022-10-17: qty 15, 14d supply, fill #0
  Filled 2022-10-30: qty 15, 14d supply, fill #1

## 2022-10-19 ENCOUNTER — Ambulatory Visit: Admit: 2022-10-19 | Discharge: 2022-10-20 | Payer: PRIVATE HEALTH INSURANCE

## 2022-10-30 MED FILL — TACROLIMUS 5 MG CAPSULE, IMMEDIATE-RELEASE: ORAL | 30 days supply | Qty: 60 | Fill #3

## 2022-10-30 MED FILL — MYCOPHENOLATE MOFETIL 250 MG CAPSULE: ORAL | 30 days supply | Qty: 30 | Fill #2

## 2022-10-30 MED FILL — ATORVASTATIN 20 MG TABLET: ORAL | 30 days supply | Qty: 30 | Fill #2

## 2022-10-30 MED FILL — VALSARTAN 40 MG TABLET: ORAL | 30 days supply | Qty: 30 | Fill #8

## 2022-11-07 DIAGNOSIS — Z942 Lung transplant status: Principal | ICD-10-CM

## 2022-11-07 DIAGNOSIS — M109 Gout, unspecified: Principal | ICD-10-CM

## 2022-11-09 ENCOUNTER — Ambulatory Visit: Admit: 2022-11-09 | Discharge: 2022-11-09 | Payer: PRIVATE HEALTH INSURANCE

## 2022-11-09 ENCOUNTER — Ambulatory Visit
Admit: 2022-11-09 | Discharge: 2022-11-09 | Payer: PRIVATE HEALTH INSURANCE | Attending: Infectious Disease | Primary: Infectious Disease

## 2022-11-09 DIAGNOSIS — M109 Gout, unspecified: Principal | ICD-10-CM

## 2022-11-09 DIAGNOSIS — M7022 Olecranon bursitis, left elbow: Principal | ICD-10-CM

## 2022-11-09 DIAGNOSIS — Z942 Lung transplant status: Principal | ICD-10-CM

## 2022-11-09 MED ORDER — ALLOPURINOL 100 MG TABLET
ORAL_TABLET | Freq: Every day | ORAL | 3 refills | 90 days | Status: CP
Start: 2022-11-09 — End: 2023-11-09
  Filled 2022-11-28: qty 270, 90d supply, fill #0

## 2022-11-13 DIAGNOSIS — D801 Nonfamilial hypogammaglobulinemia: Principal | ICD-10-CM

## 2022-11-13 DIAGNOSIS — Z942 Lung transplant status: Principal | ICD-10-CM

## 2022-11-16 ENCOUNTER — Ambulatory Visit: Admit: 2022-11-16 | Discharge: 2022-11-17 | Payer: PRIVATE HEALTH INSURANCE

## 2022-11-16 DIAGNOSIS — Z942 Lung transplant status: Principal | ICD-10-CM

## 2022-11-16 DIAGNOSIS — Q348 Other specified congenital malformations of respiratory system: Principal | ICD-10-CM

## 2022-11-16 DIAGNOSIS — D801 Nonfamilial hypogammaglobulinemia: Principal | ICD-10-CM

## 2022-11-26 DIAGNOSIS — Q348 Other specified congenital malformations of respiratory system: Principal | ICD-10-CM

## 2022-11-26 DIAGNOSIS — Z942 Lung transplant status: Principal | ICD-10-CM

## 2022-11-26 DIAGNOSIS — A498 Other bacterial infections of unspecified site: Principal | ICD-10-CM

## 2022-11-26 MED ORDER — CAYSTON 75 MG/ML SOLUTION FOR NEBULIZATION
4 refills | 0 days
Start: 2022-11-26 — End: ?

## 2022-11-28 MED ORDER — CAYSTON 75 MG/ML SOLUTION FOR NEBULIZATION
4 refills | 0 days | Status: CP
Start: 2022-11-28 — End: ?

## 2022-11-28 MED FILL — MYCOPHENOLATE MOFETIL 250 MG CAPSULE: ORAL | 30 days supply | Qty: 30 | Fill #3

## 2022-11-28 MED FILL — MONTELUKAST 10 MG TABLET: ORAL | 90 days supply | Qty: 90 | Fill #3

## 2022-11-28 MED FILL — COLCHICINE 0.6 MG TABLET: 14 days supply | Qty: 15 | Fill #2

## 2022-11-28 MED FILL — ALBUTEROL SULFATE HFA 90 MCG/ACTUATION AEROSOL INHALER: RESPIRATORY_TRACT | 25 days supply | Qty: 8.5 | Fill #6

## 2022-11-28 MED FILL — VALSARTAN 40 MG TABLET: ORAL | 30 days supply | Qty: 30 | Fill #9

## 2022-11-28 MED FILL — PANTOPRAZOLE 40 MG TABLET,DELAYED RELEASE: ORAL | 90 days supply | Qty: 180 | Fill #2

## 2022-11-28 MED FILL — ATORVASTATIN 20 MG TABLET: ORAL | 30 days supply | Qty: 30 | Fill #3

## 2022-11-28 MED FILL — AZITHROMYCIN 250 MG TABLET: ORAL | 90 days supply | Qty: 90 | Fill #1

## 2022-11-28 MED FILL — BUDESONIDE-FORMOTEROL HFA 160 MCG-4.5 MCG/ACTUATION AEROSOL INHALER: RESPIRATORY_TRACT | 30 days supply | Qty: 10.2 | Fill #5

## 2022-11-28 MED FILL — TACROLIMUS 1 MG CAPSULE, IMMEDIATE-RELEASE: ORAL | 30 days supply | Qty: 30 | Fill #2

## 2022-12-07 MED FILL — TACROLIMUS 5 MG CAPSULE, IMMEDIATE-RELEASE: ORAL | 30 days supply | Qty: 60 | Fill #4

## 2022-12-09 ENCOUNTER — Emergency Department
Admit: 2022-12-09 | Discharge: 2022-12-09 | Disposition: A | Discharge status: Home / Self Care | Payer: BLUE CROSS/BLUE SHIELD | Attending: Emergency Medicine

## 2022-12-09 ENCOUNTER — Ambulatory Visit
Admit: 2022-12-09 | Discharge: 2022-12-09 | Disposition: A | Discharge status: Home / Self Care | Payer: PRIVATE HEALTH INSURANCE | Attending: Emergency Medicine

## 2022-12-09 DIAGNOSIS — I471 SVT (supraventricular tachycardia) (CMS-HCC): Principal | ICD-10-CM

## 2022-12-14 ENCOUNTER — Ambulatory Visit: Admit: 2022-12-14 | Discharge: 2022-12-15 | Payer: BLUE CROSS/BLUE SHIELD

## 2022-12-14 DIAGNOSIS — Z942 Lung transplant status: Principal | ICD-10-CM

## 2022-12-14 DIAGNOSIS — Q348 Other specified congenital malformations of respiratory system: Principal | ICD-10-CM

## 2022-12-14 DIAGNOSIS — D801 Nonfamilial hypogammaglobulinemia: Principal | ICD-10-CM

## 2022-12-28 MED ORDER — ALBUTEROL SULFATE 2.5 MG/3 ML (0.083 %) SOLUTION FOR NEBULIZATION
RESPIRATORY_TRACT | 3 refills | 13 days | PRN
Start: 2022-12-28 — End: 2023-12-28

## 2022-12-31 DIAGNOSIS — A498 Other bacterial infections of unspecified site: Principal | ICD-10-CM

## 2022-12-31 DIAGNOSIS — Q348 Other specified congenital malformations of respiratory system: Principal | ICD-10-CM

## 2022-12-31 MED ORDER — ALBUTEROL SULFATE 2.5 MG/3 ML (0.083 %) SOLUTION FOR NEBULIZATION
RESPIRATORY_TRACT | 3 refills | 15 days | Status: CP | PRN
Start: 2022-12-31 — End: 2023-12-31
  Filled 2023-01-01: qty 270, 23d supply, fill #0

## 2022-12-31 MED ORDER — AMIKACIN 500 MG/2 ML INJECTION SOLUTION
Freq: Two times a day (BID) | RESPIRATORY_TRACT | 11 refills | 120 days
Start: 2022-12-31 — End: 2023-12-31

## 2022-12-31 MED ORDER — FUROSEMIDE 20 MG TABLET
ORAL_TABLET | Freq: Every day | ORAL | 0 refills | 7 days
Start: 2022-12-31 — End: ?

## 2023-01-01 ENCOUNTER — Ambulatory Visit
Admit: 2023-01-01 | Discharge: 2023-01-02 | Payer: BLUE CROSS/BLUE SHIELD | Attending: Internal Medicine | Primary: Internal Medicine

## 2023-01-01 DIAGNOSIS — I2510 Atherosclerotic heart disease of native coronary artery without angina pectoris: Principal | ICD-10-CM

## 2023-01-01 DIAGNOSIS — R739 Hyperglycemia, unspecified: Principal | ICD-10-CM

## 2023-01-01 DIAGNOSIS — I10 Essential (primary) hypertension: Principal | ICD-10-CM

## 2023-01-01 DIAGNOSIS — Z942 Lung transplant status: Principal | ICD-10-CM

## 2023-01-01 DIAGNOSIS — I471 SVT (supraventricular tachycardia) (CMS-HCC): Principal | ICD-10-CM

## 2023-01-01 DIAGNOSIS — Q348 Other specified congenital malformations of respiratory system: Principal | ICD-10-CM

## 2023-01-01 DIAGNOSIS — N183 Stage 3 chronic kidney disease, unspecified whether stage 3a or 3b CKD (CMS-HCC): Principal | ICD-10-CM

## 2023-01-01 DIAGNOSIS — R600 Localized edema: Principal | ICD-10-CM

## 2023-01-01 DIAGNOSIS — E785 Hyperlipidemia, unspecified: Principal | ICD-10-CM

## 2023-01-01 MED ORDER — FUROSEMIDE 20 MG TABLET
ORAL_TABLET | Freq: Every day | ORAL | 3 refills | 90.00000 days | Status: CP
Start: 2023-01-01 — End: 2023-01-01
  Filled 2023-01-01: qty 7, 7d supply, fill #0

## 2023-01-01 MED ORDER — FUROSEMIDE 40 MG TABLET
ORAL_TABLET | Freq: Every day | ORAL | 3 refills | 90 days | Status: CP
Start: 2023-01-01 — End: 2023-01-01

## 2023-01-01 MED ORDER — METOPROLOL SUCCINATE ER 25 MG TABLET,EXTENDED RELEASE 24 HR
ORAL_TABLET | Freq: Every day | ORAL | 3 refills | 90 days | Status: CP
Start: 2023-01-01 — End: 2024-01-01
  Filled 2023-01-02: qty 90, 90d supply, fill #0

## 2023-01-01 MED FILL — ALBUTEROL SULFATE HFA 90 MCG/ACTUATION AEROSOL INHALER: RESPIRATORY_TRACT | 25 days supply | Qty: 8.5 | Fill #7

## 2023-01-01 MED FILL — JANUVIA 50 MG TABLET: ORAL | 90 days supply | Qty: 90 | Fill #3

## 2023-01-01 MED FILL — BD LUER-LOK SYRINGE 3 ML 21 GAUGE X 1": 90 days supply | Qty: 100 | Fill #0

## 2023-01-01 MED FILL — BUDESONIDE-FORMOTEROL HFA 160 MCG-4.5 MCG/ACTUATION AEROSOL INHALER: RESPIRATORY_TRACT | 30 days supply | Qty: 10.2 | Fill #6

## 2023-01-01 MED FILL — PREDNISONE 2.5 MG TABLET: ORAL | 90 days supply | Qty: 90 | Fill #1

## 2023-01-01 MED FILL — ATORVASTATIN 20 MG TABLET: ORAL | 30 days supply | Qty: 30 | Fill #4

## 2023-01-01 MED FILL — PREDNISONE 5 MG TABLET: 90 days supply | Qty: 90 | Fill #2

## 2023-01-01 MED FILL — TACROLIMUS 1 MG CAPSULE, IMMEDIATE-RELEASE: ORAL | 30 days supply | Qty: 30 | Fill #3

## 2023-01-01 MED FILL — TACROLIMUS 5 MG CAPSULE, IMMEDIATE-RELEASE: ORAL | 30 days supply | Qty: 60 | Fill #5

## 2023-01-01 MED FILL — MYCOPHENOLATE MOFETIL 250 MG CAPSULE: ORAL | 30 days supply | Qty: 30 | Fill #4

## 2023-01-01 MED FILL — VALSARTAN 40 MG TABLET: ORAL | 30 days supply | Qty: 30 | Fill #10

## 2023-01-02 ENCOUNTER — Ambulatory Visit
Admit: 2023-01-02 | Discharge: 2023-01-02 | Disposition: A | Discharge status: Home / Self Care | Payer: BLUE CROSS/BLUE SHIELD | Attending: Student in an Organized Health Care Education/Training Program

## 2023-01-02 ENCOUNTER — Emergency Department
Admit: 2023-01-02 | Discharge: 2023-01-02 | Disposition: A | Discharge status: Home / Self Care | Payer: BLUE CROSS/BLUE SHIELD | Attending: Student in an Organized Health Care Education/Training Program

## 2023-01-02 DIAGNOSIS — R000 Tachycardia, unspecified: Principal | ICD-10-CM

## 2023-01-02 MED ORDER — AMIKACIN 500 MG/2 ML INJECTION SOLUTION
Freq: Two times a day (BID) | RESPIRATORY_TRACT | 11 refills | 120 days | Status: CP
Start: 2023-01-02 — End: 2024-01-02
  Filled 2023-01-02: qty 120, 30d supply, fill #0

## 2023-01-09 ENCOUNTER — Ambulatory Visit: Admit: 2023-01-09 | Discharge: 2023-01-10 | Payer: BLUE CROSS/BLUE SHIELD

## 2023-01-09 DIAGNOSIS — A498 Other bacterial infections of unspecified site: Principal | ICD-10-CM

## 2023-01-09 DIAGNOSIS — Z942 Lung transplant status: Principal | ICD-10-CM

## 2023-01-09 DIAGNOSIS — Q348 Other specified congenital malformations of respiratory system: Principal | ICD-10-CM

## 2023-01-09 MED ORDER — CAYSTON 75 MG/ML SOLUTION FOR NEBULIZATION
4 refills | 0 days | Status: CP
Start: 2023-01-09 — End: ?

## 2023-01-09 MED FILL — FUROSEMIDE 20 MG TABLET: ORAL | 90 days supply | Qty: 90 | Fill #0

## 2023-01-09 MED FILL — VALGANCICLOVIR 450 MG TABLET: ORAL | 90 days supply | Qty: 90 | Fill #1

## 2023-01-09 MED FILL — SULFAMETHOXAZOLE 400 MG-TRIMETHOPRIM 80 MG TABLET: ORAL | 84 days supply | Qty: 36 | Fill #3

## 2023-01-14 DIAGNOSIS — Z942 Lung transplant status: Principal | ICD-10-CM

## 2023-01-18 ENCOUNTER — Ambulatory Visit: Admit: 2023-01-18 | Discharge: 2023-01-18 | Payer: BLUE CROSS/BLUE SHIELD

## 2023-01-18 ENCOUNTER — Ambulatory Visit: Admit: 2023-01-18 | Discharge: 2023-01-18 | Payer: BLUE CROSS/BLUE SHIELD | Attending: Acute Care | Primary: Acute Care

## 2023-01-18 ENCOUNTER — Ambulatory Visit
Admit: 2023-01-18 | Discharge: 2023-01-18 | Payer: BLUE CROSS/BLUE SHIELD | Attending: Internal Medicine | Primary: Internal Medicine

## 2023-01-18 DIAGNOSIS — Z942 Lung transplant status: Principal | ICD-10-CM

## 2023-01-18 DIAGNOSIS — Q348 Other specified congenital malformations of respiratory system: Principal | ICD-10-CM

## 2023-01-18 DIAGNOSIS — D801 Nonfamilial hypogammaglobulinemia: Principal | ICD-10-CM

## 2023-01-18 DIAGNOSIS — Z48298 Encounter for aftercare following other organ transplant: Principal | ICD-10-CM

## 2023-01-18 MED ORDER — FUROSEMIDE 20 MG TABLET
ORAL_TABLET | Freq: Every day | ORAL | 3 refills | 90 days | Status: CP
Start: 2023-01-18 — End: 2024-01-18
  Filled 2023-02-04: qty 180, 90d supply, fill #0

## 2023-01-18 MED ORDER — COLCHICINE 0.6 MG TABLET
ORAL_TABLET | 11 refills | 29 days | Status: CP
Start: 2023-01-18 — End: 2023-02-17
  Filled 2023-01-18: qty 30, 29d supply, fill #0

## 2023-01-18 MED ORDER — SODIUM CHLORIDE 0.9 % IRRIGATION SOLUTION
Freq: Every day | 1 refills | 28 days | Status: CP
Start: 2023-01-18 — End: ?

## 2023-01-18 MED ORDER — PREDNISONE 20 MG TABLET
ORAL_TABLET | Freq: Every day | ORAL | 0 refills | 10 days | Status: CP
Start: 2023-01-18 — End: 2023-01-28
  Filled 2023-01-18: qty 20, 10d supply, fill #0

## 2023-01-21 DIAGNOSIS — R0602 Shortness of breath: Principal | ICD-10-CM

## 2023-01-21 DIAGNOSIS — Z942 Lung transplant status: Principal | ICD-10-CM

## 2023-01-25 ENCOUNTER — Ambulatory Visit
Admit: 2023-01-25 | Discharge: 2023-01-31 | Payer: BLUE CROSS/BLUE SHIELD | Attending: Internal Medicine | Primary: Internal Medicine

## 2023-01-25 ENCOUNTER — Ambulatory Visit
Admit: 2023-01-25 | Discharge: 2023-01-31 | Disposition: A | Discharge status: Home / Self Care | Payer: BLUE CROSS/BLUE SHIELD | Admitting: Student in an Organized Health Care Education/Training Program

## 2023-01-25 ENCOUNTER — Ambulatory Visit: Admit: 2023-01-25 | Discharge: 2023-01-31 | Payer: BLUE CROSS/BLUE SHIELD

## 2023-01-30 MED ORDER — DIPHENHYDRAMINE 25 MG TABLET
Freq: Three times a day (TID) | ORAL | 0 refills | 34.00 days | Status: CP
Start: 2023-01-30 — End: 2023-03-05
  Filled 2023-01-31: qty 100, 34d supply, fill #0

## 2023-01-30 MED ORDER — TACROLIMUS 1 MG CAPSULE, IMMEDIATE-RELEASE
ORAL_CAPSULE | Freq: Two times a day (BID) | ORAL | 3 refills | 90.00 days | Status: CP
Start: 2023-01-30 — End: 2024-01-30
  Filled 2023-01-31: qty 240, 30d supply, fill #0

## 2023-01-30 MED ORDER — ACETAMINOPHEN 325 MG TABLET
ORAL_TABLET | Freq: Three times a day (TID) | ORAL | 0 refills | 10.00 days | Status: CP
Start: 2023-01-30 — End: 2023-02-09
  Filled 2023-01-31: qty 60, 10d supply, fill #0

## 2023-01-31 MED ORDER — VALSARTAN 40 MG TABLET
ORAL_TABLET | Freq: Every day | ORAL | 11 refills | 30.00 days | Status: CP
Start: 2023-01-31 — End: 2024-01-31
  Filled 2023-02-04: qty 30, 30d supply, fill #0

## 2023-02-02 ENCOUNTER — Ambulatory Visit
Admit: 2023-02-02 | Discharge: 2023-02-02 | Disposition: A | Discharge status: Home / Self Care | Payer: BLUE CROSS/BLUE SHIELD

## 2023-02-04 ENCOUNTER — Encounter: Admit: 2023-02-04 | Discharge: 2023-02-04 | Payer: BLUE CROSS/BLUE SHIELD

## 2023-02-04 ENCOUNTER — Ambulatory Visit: Admit: 2023-02-04 | Discharge: 2023-02-04 | Payer: BLUE CROSS/BLUE SHIELD

## 2023-02-04 ENCOUNTER — Encounter
Admit: 2023-02-04 | Discharge: 2023-02-04 | Payer: BLUE CROSS/BLUE SHIELD | Attending: Internal Medicine | Primary: Internal Medicine

## 2023-02-04 MED FILL — ATORVASTATIN 20 MG TABLET: ORAL | 30 days supply | Qty: 30 | Fill #5

## 2023-02-04 MED FILL — AMIKACIN 500 MG/2 ML INJECTION SOLUTION: RESPIRATORY_TRACT | 30 days supply | Qty: 120 | Fill #1

## 2023-02-04 MED FILL — TACROLIMUS 5 MG CAPSULE, IMMEDIATE-RELEASE: ORAL | 30 days supply | Qty: 60 | Fill #6

## 2023-02-04 MED FILL — ALBUTEROL SULFATE HFA 90 MCG/ACTUATION AEROSOL INHALER: RESPIRATORY_TRACT | 25 days supply | Qty: 8.5 | Fill #8

## 2023-02-04 MED FILL — BUDESONIDE-FORMOTEROL HFA 160 MCG-4.5 MCG/ACTUATION AEROSOL INHALER: RESPIRATORY_TRACT | 30 days supply | Qty: 10.2 | Fill #7

## 2023-02-04 MED FILL — ALBUTEROL SULFATE 2.5 MG/3 ML (0.083 %) SOLUTION FOR NEBULIZATION: RESPIRATORY_TRACT | 23 days supply | Qty: 270 | Fill #1

## 2023-02-04 MED FILL — MYCOPHENOLATE MOFETIL 250 MG CAPSULE: ORAL | 30 days supply | Qty: 30 | Fill #5

## 2023-03-05 MED ORDER — MONTELUKAST 10 MG TABLET
ORAL_TABLET | Freq: Every evening | ORAL | 3 refills | 90.00 days
Start: 2023-03-05 — End: 2024-03-04

## 2023-03-06 MED ORDER — MONTELUKAST 10 MG TABLET
ORAL_TABLET | Freq: Every evening | ORAL | 3 refills | 90.00 days | Status: CP
Start: 2023-03-06 — End: 2024-03-05
  Filled 2023-03-06: qty 90, 90d supply, fill #0

## 2023-03-06 MED FILL — ALLOPURINOL 100 MG TABLET: ORAL | 30 days supply | Qty: 90 | Fill #1

## 2023-03-06 MED FILL — ALBUTEROL SULFATE HFA 90 MCG/ACTUATION AEROSOL INHALER: RESPIRATORY_TRACT | 25 days supply | Qty: 8.5 | Fill #9

## 2023-03-06 MED FILL — BUDESONIDE-FORMOTEROL HFA 160 MCG-4.5 MCG/ACTUATION AEROSOL INHALER: RESPIRATORY_TRACT | 30 days supply | Qty: 10.2 | Fill #8

## 2023-03-06 MED FILL — ALBUTEROL SULFATE 2.5 MG/3 ML (0.083 %) SOLUTION FOR NEBULIZATION: RESPIRATORY_TRACT | 23 days supply | Qty: 270 | Fill #2

## 2023-03-06 MED FILL — VALSARTAN 40 MG TABLET: ORAL | 30 days supply | Qty: 30 | Fill #1

## 2023-03-06 MED FILL — ATORVASTATIN 20 MG TABLET: ORAL | 30 days supply | Qty: 30 | Fill #6

## 2023-03-06 MED FILL — PANTOPRAZOLE 40 MG TABLET,DELAYED RELEASE: ORAL | 90 days supply | Qty: 180 | Fill #3

## 2023-03-06 MED FILL — TACROLIMUS 5 MG CAPSULE, IMMEDIATE-RELEASE: ORAL | 30 days supply | Qty: 60 | Fill #7

## 2023-03-06 MED FILL — MYCOPHENOLATE MOFETIL 250 MG CAPSULE: ORAL | 30 days supply | Qty: 30 | Fill #6

## 2023-03-06 MED FILL — TACROLIMUS 1 MG CAPSULE, IMMEDIATE-RELEASE: ORAL | 30 days supply | Qty: 240 | Fill #1

## 2023-03-06 MED FILL — COLCHICINE 0.6 MG TABLET: 30 days supply | Qty: 30 | Fill #1

## 2023-03-06 MED FILL — AZITHROMYCIN 250 MG TABLET: ORAL | 90 days supply | Qty: 90 | Fill #2

## 2023-03-06 MED FILL — AMIKACIN 500 MG/2 ML INJECTION SOLUTION: RESPIRATORY_TRACT | 30 days supply | Qty: 120 | Fill #2

## 2023-03-07 DIAGNOSIS — Z942 Lung transplant status: Principal | ICD-10-CM

## 2023-03-08 ENCOUNTER — Encounter: Admit: 2023-03-08 | Discharge: 2023-03-08 | Payer: BLUE CROSS/BLUE SHIELD

## 2023-03-08 ENCOUNTER — Ambulatory Visit: Admit: 2023-03-08 | Discharge: 2023-03-08 | Payer: BLUE CROSS/BLUE SHIELD

## 2023-03-08 ENCOUNTER — Inpatient Hospital Stay: Admit: 2023-03-08 | Discharge: 2023-03-08 | Payer: BLUE CROSS/BLUE SHIELD

## 2023-03-08 ENCOUNTER — Ambulatory Visit
Admit: 2023-03-08 | Discharge: 2023-03-08 | Payer: BLUE CROSS/BLUE SHIELD | Attending: Infectious Disease | Primary: Infectious Disease

## 2023-03-08 DIAGNOSIS — Q348 Other specified congenital malformations of respiratory system: Principal | ICD-10-CM

## 2023-03-08 DIAGNOSIS — Z942 Lung transplant status: Principal | ICD-10-CM

## 2023-03-08 MED ORDER — COLCHICINE 0.6 MG TABLET
ORAL_TABLET | Freq: Every day | ORAL | 11 refills | 30.00 days | Status: CP | PRN
Start: 2023-03-08 — End: ?

## 2023-03-08 MED ORDER — COLISTIMETHATE (COLISTIN) INHALATION
Freq: Every day | RESPIRATORY_TRACT | 11 refills | 0.00 days | Status: CP
Start: 2023-03-08 — End: 2023-04-07

## 2023-03-08 MED ORDER — WATER FOR INJECTION, STERILE INJECTION SOLUTION
Freq: Once | RESPIRATORY_TRACT | 11 refills | 30 days | Status: CP
Start: 2023-03-08 — End: 2023-03-08

## 2023-03-08 MED ORDER — SYRINGE WITH NEEDLE 3 ML 20 GAUGE X 1"
Freq: Every day | 11 refills | 0.00 days | Status: CP | PRN
Start: 2023-03-08 — End: ?

## 2023-03-11 DIAGNOSIS — Q348 Other specified congenital malformations of respiratory system: Principal | ICD-10-CM

## 2023-03-11 DIAGNOSIS — Z942 Lung transplant status: Principal | ICD-10-CM

## 2023-03-11 MED ORDER — WATER FOR INJECTION, STERILE INJECTION SOLUTION
Freq: Once | RESPIRATORY_TRACT | 11 refills | 30 days | Status: CP
Start: 2023-03-11 — End: 2023-03-11

## 2023-03-11 MED ORDER — SYRINGE WITH NEEDLE 3 ML 20 GAUGE X 1"
Freq: Every day | 11 refills | 0.00 days | Status: CP | PRN
Start: 2023-03-11 — End: ?

## 2023-03-20 ENCOUNTER — Ambulatory Visit
Admit: 2023-03-20 | Discharge: 2023-03-21 | Payer: BLUE CROSS/BLUE SHIELD | Attending: Nurse Practitioner | Primary: Nurse Practitioner

## 2023-03-20 DIAGNOSIS — I471 SVT (supraventricular tachycardia) (CMS-HCC): Principal | ICD-10-CM

## 2023-04-04 DIAGNOSIS — Z942 Lung transplant status: Principal | ICD-10-CM

## 2023-04-04 DIAGNOSIS — Q348 Other specified congenital malformations of respiratory system: Principal | ICD-10-CM

## 2023-04-04 MED ORDER — COLISTIMETHATE (COLISTIN) INHALATION
Freq: Every day | RESPIRATORY_TRACT | 11 refills | 0.00 days | Status: CP
Start: 2023-04-04 — End: 2023-05-04

## 2023-04-05 ENCOUNTER — Ambulatory Visit
Admit: 2023-04-05 | Discharge: 2023-04-05 | Payer: BLUE CROSS/BLUE SHIELD | Attending: Internal Medicine | Primary: Internal Medicine

## 2023-04-05 ENCOUNTER — Encounter: Admit: 2023-04-05 | Discharge: 2023-04-05 | Payer: BLUE CROSS/BLUE SHIELD

## 2023-04-05 DIAGNOSIS — I10 Essential (primary) hypertension: Principal | ICD-10-CM

## 2023-04-05 DIAGNOSIS — I471 SVT (supraventricular tachycardia) (CMS-HCC): Principal | ICD-10-CM

## 2023-04-05 DIAGNOSIS — D801 Nonfamilial hypogammaglobulinemia: Principal | ICD-10-CM

## 2023-04-05 DIAGNOSIS — E785 Hyperlipidemia, unspecified: Principal | ICD-10-CM

## 2023-04-05 DIAGNOSIS — Z942 Lung transplant status: Principal | ICD-10-CM

## 2023-04-05 DIAGNOSIS — I2510 Atherosclerotic heart disease of native coronary artery without angina pectoris: Principal | ICD-10-CM

## 2023-04-05 DIAGNOSIS — N183 Stage 3 chronic kidney disease, unspecified whether stage 3a or 3b CKD (CMS-HCC): Principal | ICD-10-CM

## 2023-04-05 DIAGNOSIS — Q348 Other specified congenital malformations of respiratory system: Principal | ICD-10-CM

## 2023-04-05 MED ORDER — SULFAMETHOXAZOLE 400 MG-TRIMETHOPRIM 80 MG TABLET
ORAL_TABLET | ORAL | 3 refills | 84.00 days | Status: CP
Start: 2023-04-05 — End: 2024-04-04
  Filled 2023-04-05: qty 36, 84d supply, fill #0

## 2023-04-05 MED FILL — MYCOPHENOLATE MOFETIL 250 MG CAPSULE: ORAL | 30 days supply | Qty: 30 | Fill #7

## 2023-04-10 DIAGNOSIS — Q348 Other specified congenital malformations of respiratory system: Principal | ICD-10-CM

## 2023-04-10 DIAGNOSIS — Z942 Lung transplant status: Principal | ICD-10-CM

## 2023-04-10 MED ORDER — SYRINGE (DISPOSABLE) 5 ML
Freq: Two times a day (BID) | 11 refills | 0.00 days | Status: CP
Start: 2023-04-10 — End: 2024-04-09

## 2023-04-10 MED ORDER — BD LUER-LOK SYRINGE 3 ML 21 GAUGE X 1"
8 refills | 0.00 days | Status: CP
Start: 2023-04-10 — End: 2023-04-10
  Filled 2023-04-11: qty 56, 28d supply, fill #0

## 2023-04-10 MED ORDER — WATER FOR INJECTION, STERILE INJECTION SOLUTION
11 refills | 0 days | Status: CP
Start: 2023-04-10 — End: 2023-04-10

## 2023-04-10 MED ORDER — MONOJECT HYPODERMIC NEEDLES 22 GAUGE X 1"
Freq: Two times a day (BID) | 11 refills | 0.00 days | Status: CP
Start: 2023-04-10 — End: 2024-04-09

## 2023-04-11 ENCOUNTER — Inpatient Hospital Stay: Admit: 2023-04-11 | Discharge: 2023-04-12 | Payer: BLUE CROSS/BLUE SHIELD

## 2023-04-11 ENCOUNTER — Ambulatory Visit: Admit: 2023-04-11 | Discharge: 2023-04-12 | Payer: BLUE CROSS/BLUE SHIELD

## 2023-04-11 ENCOUNTER — Ambulatory Visit
Admit: 2023-04-11 | Discharge: 2023-04-12 | Payer: BLUE CROSS/BLUE SHIELD | Attending: Internal Medicine | Primary: Internal Medicine

## 2023-04-11 DIAGNOSIS — Q348 Other specified congenital malformations of respiratory system: Principal | ICD-10-CM

## 2023-04-11 DIAGNOSIS — Z942 Lung transplant status: Principal | ICD-10-CM

## 2023-04-11 MED ORDER — COLISTIN (COLISTIMETHATE SODIUM) 150 MG SOLUTION FOR INJECTION
RESPIRATORY_TRACT | 11 refills | 0.00 days
Start: 2023-04-11 — End: ?

## 2023-04-11 MED ORDER — BD LUER-LOK SYRINGE 3 ML 21 GAUGE X 1"
Freq: Two times a day (BID) | 11 refills | 0.00 days | Status: CP
Start: 2023-04-11 — End: 2024-04-10

## 2023-04-11 MED ORDER — SYRINGE (DISPOSABLE) 3 ML
Freq: Two times a day (BID) | 11 refills | 0.00 days | Status: CP
Start: 2023-04-11 — End: ?

## 2023-04-11 MED ORDER — BD LUER-LOK SYRINGE 10 ML 21 GAUGE X 1"
Freq: Two times a day (BID) | 11 refills | 0.00 days | Status: CP
Start: 2023-04-11 — End: 2024-04-10
  Filled 2023-04-11: qty 40, 20d supply, fill #0

## 2023-04-11 MED ORDER — PANTOPRAZOLE 40 MG TABLET,DELAYED RELEASE
ORAL_TABLET | Freq: Two times a day (BID) | ORAL | 3 refills | 90.00 days | Status: CP
Start: 2023-04-11 — End: 2024-04-10
  Filled 2023-05-31: qty 180, 90d supply, fill #0

## 2023-04-11 MED ORDER — WATER FOR INJECTION, STERILE INJECTION SOLUTION
11 refills | 0 days | Status: CP
Start: 2023-04-11 — End: ?
  Filled 2023-04-11: qty 250, 12d supply, fill #0

## 2023-04-11 MED ORDER — COLISTIMETHATE (COLISTIN) INHALATION
Freq: Every day | RESPIRATORY_TRACT | 11 refills | 0.00 days | Status: CP
Start: 2023-04-11 — End: 2023-04-11

## 2023-04-11 MED FILL — ALBUTEROL SULFATE 2.5 MG/3 ML (0.083 %) SOLUTION FOR NEBULIZATION: RESPIRATORY_TRACT | 23 days supply | Qty: 270 | Fill #3

## 2023-04-11 MED FILL — ALBUTEROL SULFATE HFA 90 MCG/ACTUATION AEROSOL INHALER: RESPIRATORY_TRACT | 25 days supply | Qty: 8.5 | Fill #10

## 2023-04-11 MED FILL — JANUVIA 50 MG TABLET: ORAL | 30 days supply | Qty: 30 | Fill #4

## 2023-04-11 MED FILL — TACROLIMUS 1 MG CAPSULE, IMMEDIATE-RELEASE: ORAL | 30 days supply | Qty: 240 | Fill #2

## 2023-04-11 MED FILL — PREDNISONE 2.5 MG TABLET: ORAL | 90 days supply | Qty: 90 | Fill #2

## 2023-04-11 MED FILL — ATORVASTATIN 20 MG TABLET: ORAL | 30 days supply | Qty: 30 | Fill #7

## 2023-04-11 MED FILL — ALLOPURINOL 100 MG TABLET: ORAL | 60 days supply | Qty: 180 | Fill #2

## 2023-04-11 MED FILL — VALSARTAN 40 MG TABLET: ORAL | 30 days supply | Qty: 30 | Fill #2

## 2023-04-11 MED FILL — BUDESONIDE-FORMOTEROL HFA 160 MCG-4.5 MCG/ACTUATION AEROSOL INHALER: RESPIRATORY_TRACT | 30 days supply | Qty: 10.2 | Fill #9

## 2023-04-11 MED FILL — PREDNISONE 5 MG TABLET: 90 days supply | Qty: 90 | Fill #3

## 2023-04-11 MED FILL — VALGANCICLOVIR 450 MG TABLET: ORAL | 90 days supply | Qty: 90 | Fill #2

## 2023-04-16 DIAGNOSIS — Q348 Other specified congenital malformations of respiratory system: Principal | ICD-10-CM

## 2023-04-16 DIAGNOSIS — Z942 Lung transplant status: Principal | ICD-10-CM

## 2023-04-16 DIAGNOSIS — D801 Nonfamilial hypogammaglobulinemia: Principal | ICD-10-CM

## 2023-04-30 DIAGNOSIS — Z942 Lung transplant status: Principal | ICD-10-CM

## 2023-05-03 MED ORDER — ALBUTEROL SULFATE HFA 90 MCG/ACTUATION AEROSOL INHALER
Freq: Four times a day (QID) | RESPIRATORY_TRACT | 11 refills | 25.00 days | Status: CP | PRN
Start: 2023-05-03 — End: 2024-05-02

## 2023-05-03 MED ORDER — ALBUTEROL SULFATE 2.5 MG/3 ML (0.083 %) SOLUTION FOR NEBULIZATION
RESPIRATORY_TRACT | 3 refills | 15.00 days | Status: CP | PRN
Start: 2023-05-03 — End: 2024-05-02

## 2023-05-03 MED FILL — FUROSEMIDE 20 MG TABLET: ORAL | 90 days supply | Qty: 180 | Fill #1

## 2023-05-03 MED FILL — ALBUTEROL SULFATE 2.5 MG/3 ML (0.083 %) SOLUTION FOR NEBULIZATION: RESPIRATORY_TRACT | 23 days supply | Qty: 270 | Fill #0

## 2023-05-03 MED FILL — ALBUTEROL SULFATE HFA 90 MCG/ACTUATION AEROSOL INHALER: RESPIRATORY_TRACT | 25 days supply | Qty: 8.5 | Fill #0

## 2023-05-03 MED FILL — MYCOPHENOLATE MOFETIL 250 MG CAPSULE: ORAL | 30 days supply | Qty: 30 | Fill #8

## 2023-05-10 ENCOUNTER — Ambulatory Visit: Admit: 2023-05-10 | Discharge: 2023-05-11

## 2023-05-10 ENCOUNTER — Inpatient Hospital Stay: Admit: 2023-05-10 | Discharge: 2023-05-11

## 2023-05-10 ENCOUNTER — Encounter: Admit: 2023-05-10 | Discharge: 2023-05-11

## 2023-05-10 DIAGNOSIS — Z942 Lung transplant status: Principal | ICD-10-CM

## 2023-05-10 MED ORDER — BUDESONIDE-FORMOTEROL HFA 160 MCG-4.5 MCG/ACTUATION AEROSOL INHALER
Freq: Two times a day (BID) | RESPIRATORY_TRACT | 11 refills | 31 days | Status: CP
Start: 2023-05-10 — End: 2024-05-09

## 2023-05-10 MED ORDER — MYCOPHENOLATE MOFETIL 250 MG CAPSULE
ORAL_CAPSULE | Freq: Two times a day (BID) | ORAL | 3 refills | 90 days | Status: CP
Start: 2023-05-10 — End: 2024-05-09
  Filled 2023-05-17: qty 60, 30d supply, fill #0

## 2023-05-13 DIAGNOSIS — R0789 Other chest pain: Principal | ICD-10-CM

## 2023-05-13 DIAGNOSIS — R0602 Shortness of breath: Principal | ICD-10-CM

## 2023-05-15 DIAGNOSIS — Z942 Lung transplant status: Principal | ICD-10-CM

## 2023-05-15 DIAGNOSIS — D801 Nonfamilial hypogammaglobulinemia: Principal | ICD-10-CM

## 2023-05-16 ENCOUNTER — Inpatient Hospital Stay: Admit: 2023-05-16 | Discharge: 2023-05-16

## 2023-05-16 ENCOUNTER — Ambulatory Visit
Admit: 2023-05-16 | Discharge: 2023-05-16 | Attending: Student in an Organized Health Care Education/Training Program | Primary: Student in an Organized Health Care Education/Training Program

## 2023-05-16 DIAGNOSIS — M109 Gout, unspecified: Principal | ICD-10-CM

## 2023-05-16 MED FILL — ATORVASTATIN 20 MG TABLET: ORAL | 30 days supply | Qty: 30 | Fill #8

## 2023-05-16 MED FILL — VALSARTAN 40 MG TABLET: ORAL | 30 days supply | Qty: 30 | Fill #3

## 2023-05-16 MED FILL — BUDESONIDE-FORMOTEROL HFA 160 MCG-4.5 MCG/ACTUATION AEROSOL INHALER: RESPIRATORY_TRACT | 30 days supply | Qty: 10.2 | Fill #0

## 2023-05-16 MED FILL — TACROLIMUS 1 MG CAPSULE, IMMEDIATE-RELEASE: ORAL | 30 days supply | Qty: 240 | Fill #3

## 2023-05-16 MED FILL — JANUVIA 50 MG TABLET: ORAL | 30 days supply | Qty: 30 | Fill #5

## 2023-05-17 ENCOUNTER — Ambulatory Visit: Admit: 2023-05-17 | Discharge: 2023-05-18

## 2023-05-17 DIAGNOSIS — K219 Gastro-esophageal reflux disease without esophagitis: Principal | ICD-10-CM

## 2023-05-17 DIAGNOSIS — R059 Cough, unspecified type: Principal | ICD-10-CM

## 2023-05-17 DIAGNOSIS — Z942 Lung transplant status: Principal | ICD-10-CM

## 2023-05-29 ENCOUNTER — Ambulatory Visit: Admit: 2023-05-29 | Discharge: 2023-05-29 | Payer: BLUE CROSS/BLUE SHIELD

## 2023-05-29 ENCOUNTER — Inpatient Hospital Stay: Admit: 2023-05-29 | Discharge: 2023-05-29 | Payer: BLUE CROSS/BLUE SHIELD

## 2023-05-29 MED ORDER — POSACONAZOLE 100 MG TABLET,DELAYED RELEASE
ORAL_TABLET | Freq: Every day | ORAL | 0 refills | 14.00 days | Status: CN
Start: 2023-05-29 — End: 2023-06-12

## 2023-05-30 DIAGNOSIS — Z942 Lung transplant status: Principal | ICD-10-CM

## 2023-05-30 MED ORDER — POSACONAZOLE 100 MG TABLET,DELAYED RELEASE
ORAL_TABLET | Freq: Every day | ORAL | 11 refills | 30.00 days | Status: CP
Start: 2023-05-30 — End: ?
  Filled 2023-05-31: qty 90, 30d supply, fill #0

## 2023-05-31 DIAGNOSIS — A498 Other bacterial infections of unspecified site: Principal | ICD-10-CM

## 2023-05-31 DIAGNOSIS — Z5181 Encounter for therapeutic drug level monitoring: Principal | ICD-10-CM

## 2023-05-31 DIAGNOSIS — Q348 Other specified congenital malformations of respiratory system: Principal | ICD-10-CM

## 2023-05-31 DIAGNOSIS — Z942 Lung transplant status: Principal | ICD-10-CM

## 2023-05-31 MED FILL — MONTELUKAST 10 MG TABLET: ORAL | 90 days supply | Qty: 90 | Fill #1

## 2023-05-31 MED FILL — AZITHROMYCIN 250 MG TABLET: ORAL | 90 days supply | Qty: 90 | Fill #3

## 2023-06-03 ENCOUNTER — Ambulatory Visit: Admit: 2023-06-03 | Discharge: 2023-06-04 | Payer: BLUE CROSS/BLUE SHIELD

## 2023-06-07 ENCOUNTER — Encounter: Admit: 2023-06-07 | Discharge: 2023-06-07 | Payer: BLUE CROSS/BLUE SHIELD

## 2023-06-11 ENCOUNTER — Ambulatory Visit: Admit: 2023-06-11 | Discharge: 2023-06-12 | Payer: BLUE CROSS/BLUE SHIELD

## 2023-06-11 MED ORDER — SITAGLIPTIN PHOSPHATE 50 MG TABLET
ORAL_TABLET | Freq: Every day | ORAL | 3 refills | 90.00000 days
Start: 2023-06-11 — End: 2024-06-10

## 2023-06-12 DIAGNOSIS — Z942 Lung transplant status: Principal | ICD-10-CM

## 2023-06-12 MED ORDER — SITAGLIPTIN PHOSPHATE 50 MG TABLET
ORAL_TABLET | Freq: Every day | ORAL | 3 refills | 90.00000 days | Status: CP
Start: 2023-06-12 — End: 2024-06-11
  Filled 2023-06-28: qty 90, 90d supply, fill #0

## 2023-06-12 MED ORDER — TACROLIMUS 1 MG CAPSULE, IMMEDIATE-RELEASE
ORAL_CAPSULE | Freq: Two times a day (BID) | ORAL | 3 refills | 90.00000 days | Status: CP
Start: 2023-06-12 — End: 2024-06-11

## 2023-06-14 ENCOUNTER — Encounter
Admit: 2023-06-14 | Discharge: 2023-06-15 | Payer: BLUE CROSS/BLUE SHIELD | Attending: Internal Medicine | Primary: Internal Medicine

## 2023-06-14 ENCOUNTER — Ambulatory Visit: Admit: 2023-06-14 | Discharge: 2023-06-15 | Payer: BLUE CROSS/BLUE SHIELD

## 2023-06-17 DIAGNOSIS — Z942 Lung transplant status: Principal | ICD-10-CM

## 2023-06-17 MED ORDER — TACROLIMUS 0.5 MG CAPSULE, IMMEDIATE-RELEASE
ORAL_CAPSULE | Freq: Every evening | ORAL | 3 refills | 90.00000 days | Status: CP
Start: 2023-06-17 — End: 2024-06-16
  Filled 2023-06-20: qty 30, 30d supply, fill #0

## 2023-06-17 MED ORDER — TACROLIMUS 1 MG CAPSULE, IMMEDIATE-RELEASE
ORAL_CAPSULE | Freq: Every day | ORAL | 3 refills | 90.00000 days | Status: CP
Start: 2023-06-17 — End: 2024-06-16

## 2023-06-20 ENCOUNTER — Ambulatory Visit: Admit: 2023-06-20 | Discharge: 2023-06-21 | Payer: BLUE CROSS/BLUE SHIELD

## 2023-06-20 ENCOUNTER — Encounter: Admit: 2023-06-20 | Discharge: 2023-06-21 | Payer: BLUE CROSS/BLUE SHIELD

## 2023-06-20 MED FILL — ATORVASTATIN 20 MG TABLET: ORAL | 30 days supply | Qty: 30 | Fill #9

## 2023-06-20 MED FILL — VALSARTAN 40 MG TABLET: ORAL | 30 days supply | Qty: 30 | Fill #4

## 2023-06-20 MED FILL — ALBUTEROL SULFATE 2.5 MG/3 ML (0.083 %) SOLUTION FOR NEBULIZATION: RESPIRATORY_TRACT | 23 days supply | Qty: 270 | Fill #1

## 2023-06-20 MED FILL — TACROLIMUS 0.5 MG CAPSULE, IMMEDIATE-RELEASE: ORAL | 30 days supply | Qty: 30 | Fill #0

## 2023-06-20 MED FILL — MYCOPHENOLATE MOFETIL 250 MG CAPSULE: ORAL | 30 days supply | Qty: 60 | Fill #1

## 2023-06-20 MED FILL — ALLOPURINOL 100 MG TABLET: ORAL | 90 days supply | Qty: 270 | Fill #3

## 2023-06-25 ENCOUNTER — Ambulatory Visit: Admit: 2023-06-25 | Discharge: 2023-06-26 | Payer: BLUE CROSS/BLUE SHIELD

## 2023-06-28 MED ORDER — PREDNISONE 5 MG TABLET
ORAL_TABLET | 3 refills | 0.00000 days
Start: 2023-06-28 — End: 2024-06-27

## 2023-06-28 MED FILL — POSACONAZOLE 100 MG TABLET,DELAYED RELEASE: ORAL | 30 days supply | Qty: 90 | Fill #1

## 2023-06-28 MED FILL — SULFAMETHOXAZOLE 400 MG-TRIMETHOPRIM 80 MG TABLET: ORAL | 84 days supply | Qty: 36 | Fill #1

## 2023-07-01 MED ORDER — PREDNISONE 5 MG TABLET
ORAL_TABLET | Freq: Every day | ORAL | 3 refills | 90.00000 days | Status: CP
Start: 2023-07-01 — End: 2024-06-30
  Filled 2023-07-02: qty 90, 90d supply, fill #0

## 2023-07-02 ENCOUNTER — Ambulatory Visit: Admit: 2023-07-02 | Discharge: 2023-07-03 | Payer: BLUE CROSS/BLUE SHIELD

## 2023-07-02 MED FILL — BUDESONIDE-FORMOTEROL HFA 160 MCG-4.5 MCG/ACTUATION AEROSOL INHALER: RESPIRATORY_TRACT | 30 days supply | Qty: 10.2 | Fill #1

## 2023-07-09 ENCOUNTER — Ambulatory Visit: Admit: 2023-07-09 | Discharge: 2023-07-10 | Payer: BLUE CROSS/BLUE SHIELD

## 2023-07-19 ENCOUNTER — Ambulatory Visit: Admit: 2023-07-19 | Discharge: 2023-07-20 | Payer: BLUE CROSS/BLUE SHIELD

## 2023-07-25 ENCOUNTER — Ambulatory Visit: Admit: 2023-07-25 | Discharge: 2023-07-26 | Payer: BLUE CROSS/BLUE SHIELD

## 2023-07-25 DIAGNOSIS — Z5181 Encounter for therapeutic drug level monitoring: Principal | ICD-10-CM

## 2023-07-25 DIAGNOSIS — N183 Stage 3 chronic kidney disease, unspecified whether stage 3a or 3b CKD (CMS-HCC): Principal | ICD-10-CM

## 2023-07-25 DIAGNOSIS — A498 Other bacterial infections of unspecified site: Principal | ICD-10-CM

## 2023-07-25 DIAGNOSIS — Z942 Lung transplant status: Principal | ICD-10-CM

## 2023-07-25 MED FILL — ATORVASTATIN 20 MG TABLET: ORAL | 30 days supply | Qty: 30 | Fill #10

## 2023-07-25 MED FILL — TACROLIMUS 0.5 MG CAPSULE, IMMEDIATE-RELEASE: ORAL | 30 days supply | Qty: 30 | Fill #1

## 2023-07-25 MED FILL — TACROLIMUS 1 MG CAPSULE, IMMEDIATE-RELEASE: ORAL | 30 days supply | Qty: 30 | Fill #1

## 2023-07-25 MED FILL — VALGANCICLOVIR 450 MG TABLET: ORAL | 90 days supply | Qty: 90 | Fill #3

## 2023-07-25 MED FILL — ALBUTEROL SULFATE 2.5 MG/3 ML (0.083 %) SOLUTION FOR NEBULIZATION: RESPIRATORY_TRACT | 23 days supply | Qty: 270 | Fill #2

## 2023-07-25 MED FILL — PREDNISONE 2.5 MG TABLET: ORAL | 90 days supply | Qty: 90 | Fill #3

## 2023-07-25 MED FILL — VALSARTAN 40 MG TABLET: ORAL | 30 days supply | Qty: 30 | Fill #5

## 2023-07-25 MED FILL — AMIKACIN 500 MG/2 ML INJECTION SOLUTION: RESPIRATORY_TRACT | 30 days supply | Qty: 120 | Fill #3

## 2023-07-25 MED FILL — MYCOPHENOLATE MOFETIL 250 MG CAPSULE: ORAL | 30 days supply | Qty: 60 | Fill #2

## 2023-07-31 MED ORDER — COLCHICINE 0.6 MG TABLET
ORAL_TABLET | ORAL | 11 refills | 29.00000 days | Status: CP
Start: 2023-07-31 — End: 2023-08-30
  Filled 2023-07-31: qty 30, 29d supply, fill #0

## 2023-07-31 MED FILL — POSACONAZOLE 100 MG TABLET,DELAYED RELEASE: ORAL | 30 days supply | Qty: 90 | Fill #2

## 2023-08-12 ENCOUNTER — Inpatient Hospital Stay: Admit: 2023-08-12 | Discharge: 2023-08-12 | Payer: BLUE CROSS/BLUE SHIELD

## 2023-08-12 ENCOUNTER — Encounter: Admit: 2023-08-12 | Discharge: 2023-08-12 | Payer: BLUE CROSS/BLUE SHIELD

## 2023-08-14 ENCOUNTER — Ambulatory Visit
Admit: 2023-08-14 | Discharge: 2023-08-14 | Payer: BLUE CROSS/BLUE SHIELD | Attending: Internal Medicine | Primary: Internal Medicine

## 2023-08-14 ENCOUNTER — Encounter: Admit: 2023-08-14 | Discharge: 2023-08-14 | Payer: BLUE CROSS/BLUE SHIELD

## 2023-08-14 ENCOUNTER — Inpatient Hospital Stay: Admit: 2023-08-14 | Discharge: 2023-08-14 | Payer: BLUE CROSS/BLUE SHIELD

## 2023-08-14 DIAGNOSIS — D801 Nonfamilial hypogammaglobulinemia: Principal | ICD-10-CM

## 2023-08-14 DIAGNOSIS — J4A9 Chronic lung allograft dysfunction (CLAD): Principal | ICD-10-CM

## 2023-08-14 DIAGNOSIS — Z942 Lung transplant status: Principal | ICD-10-CM

## 2023-08-14 DIAGNOSIS — K219 Gastro-esophageal reflux disease without esophagitis: Principal | ICD-10-CM

## 2023-08-14 DIAGNOSIS — N183 Stage 3 chronic kidney disease, unspecified whether stage 3a or 3b CKD (CMS-HCC): Principal | ICD-10-CM

## 2023-08-14 DIAGNOSIS — Z5181 Encounter for therapeutic drug level monitoring: Principal | ICD-10-CM

## 2023-08-14 DIAGNOSIS — Q348 Other specified congenital malformations of respiratory system: Principal | ICD-10-CM

## 2023-08-14 DIAGNOSIS — A498 Other bacterial infections of unspecified site: Principal | ICD-10-CM

## 2023-08-19 ENCOUNTER — Ambulatory Visit: Admit: 2023-08-19 | Discharge: 2023-08-20 | Payer: BLUE CROSS/BLUE SHIELD

## 2023-08-20 ENCOUNTER — Inpatient Hospital Stay: Admit: 2023-08-20 | Discharge: 2023-08-20 | Payer: BLUE CROSS/BLUE SHIELD

## 2023-08-22 ENCOUNTER — Ambulatory Visit
Admit: 2023-08-22 | Discharge: 2023-08-22 | Payer: BLUE CROSS/BLUE SHIELD | Attending: Rheumatology | Primary: Rheumatology

## 2023-08-22 ENCOUNTER — Ambulatory Visit: Admit: 2023-08-22 | Discharge: 2023-08-22 | Payer: BLUE CROSS/BLUE SHIELD | Attending: Family | Primary: Family

## 2023-08-22 DIAGNOSIS — M25532 Pain in left wrist: Principal | ICD-10-CM

## 2023-08-22 DIAGNOSIS — M1A0320 Idiopathic chronic gout, left wrist, without tophus (tophi): Principal | ICD-10-CM

## 2023-08-22 MED ORDER — PREDNISONE 10 MG TABLET
ORAL_TABLET | ORAL | 0 refills | 88.00000 days
Start: 2023-08-22 — End: 2023-11-18

## 2023-08-23 MED ORDER — PREDNISONE 10 MG TABLET
ORAL_TABLET | ORAL | 0 refills | 88.00000 days | Status: CP
Start: 2023-08-23 — End: 2023-11-19

## 2023-08-30 ENCOUNTER — Ambulatory Visit: Admit: 2023-08-30 | Discharge: 2023-08-31 | Payer: BLUE CROSS/BLUE SHIELD

## 2023-08-30 DIAGNOSIS — Z942 Lung transplant status: Principal | ICD-10-CM

## 2023-08-30 MED ORDER — ATORVASTATIN 20 MG TABLET
ORAL_TABLET | Freq: Every day | ORAL | 11 refills | 30.00000 days | Status: CP
Start: 2023-08-30 — End: 2024-08-29
  Filled 2023-08-30: qty 30, 30d supply, fill #0

## 2023-08-30 MED ORDER — AZITHROMYCIN 250 MG TABLET
ORAL_TABLET | Freq: Every day | ORAL | 3 refills | 90.00000 days | Status: CP
Start: 2023-08-30 — End: 2024-08-29
  Filled 2023-08-30: qty 90, 90d supply, fill #0

## 2023-08-30 MED ORDER — PREDNISONE 2.5 MG TABLET
ORAL_TABLET | Freq: Every day | ORAL | 3 refills | 90.00000 days | Status: CP
Start: 2023-08-30 — End: ?

## 2023-08-30 MED FILL — TACROLIMUS 1 MG CAPSULE, IMMEDIATE-RELEASE: ORAL | 30 days supply | Qty: 30 | Fill #2

## 2023-08-30 MED FILL — FUROSEMIDE 20 MG TABLET: ORAL | 90 days supply | Qty: 180 | Fill #2

## 2023-08-30 MED FILL — AMIKACIN 500 MG/2 ML INJECTION SOLUTION: RESPIRATORY_TRACT | 30 days supply | Qty: 120 | Fill #4

## 2023-08-30 MED FILL — MYCOPHENOLATE MOFETIL 250 MG CAPSULE: ORAL | 30 days supply | Qty: 60 | Fill #3

## 2023-08-30 MED FILL — TACROLIMUS 0.5 MG CAPSULE, IMMEDIATE-RELEASE: ORAL | 30 days supply | Qty: 30 | Fill #2

## 2023-08-30 MED FILL — VALSARTAN 40 MG TABLET: ORAL | 30 days supply | Qty: 30 | Fill #6

## 2023-08-30 MED FILL — POSACONAZOLE 100 MG TABLET,DELAYED RELEASE: ORAL | 3 days supply | Qty: 9 | Fill #3

## 2023-08-30 MED FILL — PANTOPRAZOLE 40 MG TABLET,DELAYED RELEASE: ORAL | 90 days supply | Qty: 180 | Fill #1

## 2023-08-30 MED FILL — MONTELUKAST 10 MG TABLET: ORAL | 90 days supply | Qty: 90 | Fill #2

## 2023-08-30 MED FILL — BUDESONIDE-FORMOTEROL HFA 160 MCG-4.5 MCG/ACTUATION AEROSOL INHALER: RESPIRATORY_TRACT | 30 days supply | Qty: 10.2 | Fill #2

## 2023-08-30 MED FILL — ALBUTEROL SULFATE 2.5 MG/3 ML (0.083 %) SOLUTION FOR NEBULIZATION: RESPIRATORY_TRACT | 23 days supply | Qty: 270 | Fill #3

## 2023-09-02 ENCOUNTER — Inpatient Hospital Stay: Admit: 2023-09-02 | Discharge: 2023-09-02 | Payer: BLUE CROSS/BLUE SHIELD

## 2023-09-02 DIAGNOSIS — M109 Gout, unspecified: Principal | ICD-10-CM

## 2023-09-02 DIAGNOSIS — M1A0320 Idiopathic chronic gout, left wrist, without tophus (tophi): Principal | ICD-10-CM

## 2023-09-02 MED ORDER — ALLOPURINOL 100 MG TABLET
ORAL_TABLET | Freq: Every day | ORAL | 0 refills | 90.00000 days | Status: CP
Start: 2023-09-02 — End: 2024-09-01
  Filled 2023-09-02: qty 315, 90d supply, fill #0

## 2023-09-02 MED ORDER — PREDNISONE 10 MG TABLET
ORAL_TABLET | ORAL | 0 refills | 5.00000 days | Status: CP
Start: 2023-09-02 — End: 2023-09-07
  Filled 2023-09-02: qty 12, 5d supply, fill #0

## 2023-09-02 MED FILL — POSACONAZOLE 100 MG TABLET,DELAYED RELEASE: ORAL | 30 days supply | Qty: 90 | Fill #4

## 2023-09-09 ENCOUNTER — Ambulatory Visit: Admit: 2023-09-09 | Discharge: 2023-09-10 | Payer: BLUE CROSS/BLUE SHIELD

## 2023-09-09 DIAGNOSIS — Z942 Lung transplant status: Principal | ICD-10-CM

## 2023-09-09 DIAGNOSIS — M109 Gout, unspecified: Principal | ICD-10-CM

## 2023-09-09 MED ORDER — MYCOPHENOLATE MOFETIL 250 MG CAPSULE
ORAL_CAPSULE | ORAL | 3 refills | 0.00000 days | Status: CP
Start: 2023-09-09 — End: 2023-09-09

## 2023-09-10 DIAGNOSIS — Z942 Lung transplant status: Principal | ICD-10-CM

## 2023-09-11 DIAGNOSIS — Z942 Lung transplant status: Principal | ICD-10-CM

## 2023-09-11 DIAGNOSIS — A498 Other bacterial infections of unspecified site: Principal | ICD-10-CM

## 2023-09-11 DIAGNOSIS — Z5181 Encounter for therapeutic drug level monitoring: Principal | ICD-10-CM

## 2023-09-13 ENCOUNTER — Encounter: Admit: 2023-09-13 | Discharge: 2023-09-14 | Payer: BLUE CROSS/BLUE SHIELD

## 2023-09-13 ENCOUNTER — Ambulatory Visit: Admit: 2023-09-13 | Discharge: 2023-09-14 | Payer: BLUE CROSS/BLUE SHIELD

## 2023-09-13 DIAGNOSIS — Z942 Lung transplant status: Principal | ICD-10-CM

## 2023-09-13 DIAGNOSIS — Z5181 Encounter for therapeutic drug level monitoring: Principal | ICD-10-CM

## 2023-09-16 ENCOUNTER — Encounter: Admit: 2023-09-16 | Discharge: 2023-09-17 | Payer: BLUE CROSS/BLUE SHIELD | Attending: Acute Care | Primary: Acute Care

## 2023-09-16 DIAGNOSIS — Z942 Lung transplant status: Principal | ICD-10-CM

## 2023-09-16 DIAGNOSIS — M85859 Other specified disorders of bone density and structure, unspecified thigh: Principal | ICD-10-CM

## 2023-09-20 ENCOUNTER — Ambulatory Visit: Admit: 2023-09-20 | Discharge: 2023-09-21 | Payer: BLUE CROSS/BLUE SHIELD

## 2023-09-20 DIAGNOSIS — Z942 Lung transplant status: Principal | ICD-10-CM

## 2023-09-20 DIAGNOSIS — A498 Other bacterial infections of unspecified site: Principal | ICD-10-CM

## 2023-09-20 DIAGNOSIS — Q348 Other specified congenital malformations of respiratory system: Principal | ICD-10-CM

## 2023-09-20 MED ORDER — CAYSTON 75 MG/ML SOLUTION FOR NEBULIZATION
RESPIRATORY_TRACT | 4 refills | 0.00000 days | Status: CP
Start: 2023-09-20 — End: ?

## 2023-09-26 DIAGNOSIS — M85859 Other specified disorders of bone density and structure, unspecified thigh: Principal | ICD-10-CM

## 2023-09-26 DIAGNOSIS — Z942 Lung transplant status: Principal | ICD-10-CM

## 2023-09-27 ENCOUNTER — Ambulatory Visit: Admit: 2023-09-27 | Discharge: 2023-09-28 | Payer: BLUE CROSS/BLUE SHIELD

## 2023-09-27 MED ORDER — ALBUTEROL SULFATE 2.5 MG/3 ML (0.083 %) SOLUTION FOR NEBULIZATION
RESPIRATORY_TRACT | 3 refills | 15.00000 days | Status: CP | PRN
Start: 2023-09-27 — End: 2024-09-26
  Filled 2023-10-26: qty 270, 15d supply, fill #0

## 2023-09-27 MED FILL — COLCHICINE 0.6 MG TABLET: ORAL | 29 days supply | Qty: 30 | Fill #1

## 2023-09-27 MED FILL — ATORVASTATIN 20 MG TABLET: ORAL | 30 days supply | Qty: 30 | Fill #1

## 2023-09-27 MED FILL — TACROLIMUS 0.5 MG CAPSULE, IMMEDIATE-RELEASE: ORAL | 30 days supply | Qty: 30 | Fill #3

## 2023-09-27 MED FILL — TACROLIMUS 1 MG CAPSULE, IMMEDIATE-RELEASE: ORAL | 30 days supply | Qty: 30 | Fill #3

## 2023-09-27 MED FILL — SULFAMETHOXAZOLE 400 MG-TRIMETHOPRIM 80 MG TABLET: ORAL | 84 days supply | Qty: 36 | Fill #2

## 2023-09-27 MED FILL — PREDNISONE 5 MG TABLET: ORAL | 90 days supply | Qty: 90 | Fill #1

## 2023-09-27 MED FILL — AMIKACIN 500 MG/2 ML INJECTION SOLUTION: RESPIRATORY_TRACT | 30 days supply | Qty: 120 | Fill #5

## 2023-09-27 MED FILL — BUDESONIDE-FORMOTEROL HFA 160 MCG-4.5 MCG/ACTUATION AEROSOL INHALER: RESPIRATORY_TRACT | 30 days supply | Qty: 10.2 | Fill #3

## 2023-09-27 MED FILL — VALSARTAN 40 MG TABLET: ORAL | 30 days supply | Qty: 30 | Fill #7

## 2023-09-27 MED FILL — POSACONAZOLE 100 MG TABLET,DELAYED RELEASE: ORAL | 30 days supply | Qty: 90 | Fill #5

## 2023-09-27 MED FILL — JANUVIA 50 MG TABLET: ORAL | 90 days supply | Qty: 90 | Fill #1

## 2023-10-01 DIAGNOSIS — Z942 Lung transplant status: Principal | ICD-10-CM

## 2023-10-01 DIAGNOSIS — M109 Gout, unspecified: Principal | ICD-10-CM

## 2023-10-03 ENCOUNTER — Ambulatory Visit: Admit: 2023-10-03 | Discharge: 2023-10-04 | Payer: BLUE CROSS/BLUE SHIELD

## 2023-10-16 ENCOUNTER — Ambulatory Visit: Admit: 2023-10-16 | Discharge: 2023-10-16 | Payer: BLUE CROSS/BLUE SHIELD

## 2023-10-16 ENCOUNTER — Encounter: Admit: 2023-10-16 | Discharge: 2023-10-16 | Payer: BLUE CROSS/BLUE SHIELD

## 2023-10-16 ENCOUNTER — Ambulatory Visit
Admit: 2023-10-16 | Discharge: 2023-10-21 | Disposition: A | Discharge status: Home / Self Care | Payer: BLUE CROSS/BLUE SHIELD | Admitting: Internal Medicine

## 2023-10-16 ENCOUNTER — Inpatient Hospital Stay: Admit: 2023-10-16 | Discharge: 2023-10-16 | Payer: BLUE CROSS/BLUE SHIELD

## 2023-10-16 ENCOUNTER — Inpatient Hospital Stay
Admit: 2023-10-16 | Discharge: 2023-10-21 | Disposition: A | Discharge status: Home / Self Care | Payer: BLUE CROSS/BLUE SHIELD | Admitting: Internal Medicine

## 2023-10-18 MED ORDER — GRANIX 480 MCG/0.8 ML SUBCUTANEOUS SYRINGE
Freq: Every day | SUBCUTANEOUS | 0 refills | 5.00000 days | Status: CP | PRN
Start: 2023-10-18 — End: ?

## 2023-10-18 MED ORDER — FILGRASTIM-AAFI 480 MCG/0.8 ML SUBCUTANEOUS SYRINGE
Freq: Every day | SUBCUTANEOUS | PRN refills | 5.00000 days | Status: CP | PRN
Start: 2023-10-18 — End: 2023-10-18

## 2023-10-21 MED ORDER — PREDNISONE 2.5 MG TABLET
ORAL_TABLET | Freq: Every day | ORAL | 0 refills | 30.00000 days | Status: CP
Start: 2023-10-21 — End: ?

## 2023-10-21 MED ORDER — PREDNISONE 5 MG TABLET
ORAL_TABLET | Freq: Every day | ORAL | 0 refills | 30.00000 days | Status: CP
Start: 2023-10-21 — End: ?

## 2023-10-22 DIAGNOSIS — B259 Cytomegaloviral disease, unspecified: Principal | ICD-10-CM

## 2023-10-22 MED ORDER — VALGANCICLOVIR 450 MG TABLET
ORAL_TABLET | Freq: Every day | ORAL | 3 refills | 90.00000 days
Start: 2023-10-22 — End: 2024-10-21

## 2023-10-23 DIAGNOSIS — R600 Localized edema: Principal | ICD-10-CM

## 2023-10-23 DIAGNOSIS — Z942 Lung transplant status: Principal | ICD-10-CM

## 2023-10-23 DIAGNOSIS — B999 Unspecified infectious disease: Principal | ICD-10-CM

## 2023-10-23 DIAGNOSIS — D709 Neutropenia, unspecified: Principal | ICD-10-CM

## 2023-10-23 DIAGNOSIS — D801 Nonfamilial hypogammaglobulinemia: Principal | ICD-10-CM

## 2023-10-23 DIAGNOSIS — D508 Other iron deficiency anemias: Principal | ICD-10-CM

## 2023-10-23 MED ORDER — VALGANCICLOVIR 450 MG TABLET
ORAL_TABLET | Freq: Every day | ORAL | 3 refills | 90.00000 days | Status: CP
Start: 2023-10-23 — End: 2024-10-22
  Filled 2023-10-26: qty 90, 90d supply, fill #0

## 2023-10-24 ENCOUNTER — Ambulatory Visit: Admit: 2023-10-24 | Discharge: 2023-10-25 | Payer: BLUE CROSS/BLUE SHIELD

## 2023-10-24 DIAGNOSIS — A498 Other bacterial infections of unspecified site: Principal | ICD-10-CM

## 2023-10-24 DIAGNOSIS — B49 Unspecified mycosis: Principal | ICD-10-CM

## 2023-10-24 DIAGNOSIS — B999 Unspecified infectious disease: Principal | ICD-10-CM

## 2023-10-24 DIAGNOSIS — J329 Chronic sinusitis, unspecified: Principal | ICD-10-CM

## 2023-10-24 DIAGNOSIS — Z942 Lung transplant status: Principal | ICD-10-CM

## 2023-10-25 DIAGNOSIS — B999 Unspecified infectious disease: Principal | ICD-10-CM

## 2023-10-25 MED ORDER — PREDNISONE 10 MG TABLET
ORAL_TABLET | ORAL | 0 refills | 8.00000 days | Status: CP
Start: 2023-10-25 — End: 2023-11-02
  Filled 2023-10-26: qty 20, 8d supply, fill #0

## 2023-10-26 ENCOUNTER — Ambulatory Visit: Admit: 2023-10-26 | Discharge: 2023-10-27 | Payer: BLUE CROSS/BLUE SHIELD

## 2023-10-26 MED FILL — ALBUTEROL SULFATE HFA 90 MCG/ACTUATION AEROSOL INHALER: RESPIRATORY_TRACT | 25 days supply | Qty: 8.5 | Fill #1

## 2023-10-26 MED FILL — POSACONAZOLE 100 MG TABLET,DELAYED RELEASE: ORAL | 30 days supply | Qty: 90 | Fill #6

## 2023-10-30 DIAGNOSIS — Z0000 Encounter for general adult medical examination without abnormal findings: Principal | ICD-10-CM

## 2023-10-30 DIAGNOSIS — B999 Unspecified infectious disease: Principal | ICD-10-CM

## 2023-10-30 DIAGNOSIS — M810 Age-related osteoporosis without current pathological fracture: Principal | ICD-10-CM

## 2023-10-31 DIAGNOSIS — B999 Unspecified infectious disease: Principal | ICD-10-CM

## 2023-11-11 DIAGNOSIS — B999 Unspecified infectious disease: Principal | ICD-10-CM

## 2023-11-12 DIAGNOSIS — B999 Unspecified infectious disease: Principal | ICD-10-CM

## 2023-11-12 MED ORDER — PREDNISONE 10 MG TABLET
ORAL_TABLET | ORAL | 0 refills | 8.00000 days | Status: CP
Start: 2023-11-12 — End: 2023-11-20
  Filled 2023-11-13: qty 20, 8d supply, fill #0

## 2023-11-12 MED ORDER — FEBUXOSTAT 80 MG TABLET
ORAL_TABLET | Freq: Every day | ORAL | 3 refills | 90.00000 days | Status: CP
Start: 2023-11-12 — End: 2024-11-11
  Filled 2023-11-13: qty 60, 60d supply, fill #0

## 2023-11-13 MED FILL — ATORVASTATIN 20 MG TABLET: ORAL | 30 days supply | Qty: 30 | Fill #2

## 2023-11-13 MED FILL — VALSARTAN 40 MG TABLET: ORAL | 30 days supply | Qty: 30 | Fill #8

## 2023-11-13 MED FILL — TACROLIMUS 1 MG CAPSULE, IMMEDIATE-RELEASE: ORAL | 30 days supply | Qty: 30 | Fill #4

## 2023-11-13 MED FILL — TACROLIMUS 0.5 MG CAPSULE, IMMEDIATE-RELEASE: ORAL | 30 days supply | Qty: 30 | Fill #4

## 2023-11-17 DIAGNOSIS — B999 Unspecified infectious disease: Principal | ICD-10-CM

## 2023-11-18 DIAGNOSIS — Z942 Lung transplant status: Principal | ICD-10-CM

## 2023-11-18 DIAGNOSIS — B999 Unspecified infectious disease: Principal | ICD-10-CM

## 2023-11-18 MED ORDER — POSACONAZOLE 100 MG TABLET,DELAYED RELEASE
ORAL_TABLET | Freq: Every day | ORAL | 3 refills | 90.00000 days | Status: CP
Start: 2023-11-18 — End: ?
  Filled 2023-12-02: qty 270, 90d supply, fill #0

## 2023-11-20 ENCOUNTER — Ambulatory Visit
Admit: 2023-11-20 | Discharge: 2023-11-21 | Payer: BLUE CROSS/BLUE SHIELD | Attending: Internal Medicine | Primary: Internal Medicine

## 2023-11-20 ENCOUNTER — Encounter: Admit: 2023-11-20 | Discharge: 2023-11-21 | Payer: BLUE CROSS/BLUE SHIELD | Attending: Acute Care | Primary: Acute Care

## 2023-11-20 ENCOUNTER — Ambulatory Visit: Admit: 2023-11-20 | Discharge: 2023-11-21 | Payer: BLUE CROSS/BLUE SHIELD

## 2023-11-20 ENCOUNTER — Inpatient Hospital Stay: Admit: 2023-11-20 | Discharge: 2023-11-21 | Payer: BLUE CROSS/BLUE SHIELD

## 2023-11-20 ENCOUNTER — Ambulatory Visit
Admit: 2023-11-20 | Discharge: 2023-11-21 | Payer: BLUE CROSS/BLUE SHIELD | Attending: Student in an Organized Health Care Education/Training Program | Primary: Student in an Organized Health Care Education/Training Program

## 2023-11-20 DIAGNOSIS — Z1283 Encounter for screening for malignant neoplasm of skin: Principal | ICD-10-CM

## 2023-11-20 DIAGNOSIS — Z942 Lung transplant status: Principal | ICD-10-CM

## 2023-11-20 DIAGNOSIS — M109 Gout, unspecified: Principal | ICD-10-CM

## 2023-11-20 DIAGNOSIS — Z79899 Other long term (current) drug therapy: Principal | ICD-10-CM

## 2023-11-20 DIAGNOSIS — B999 Unspecified infectious disease: Principal | ICD-10-CM

## 2023-11-20 DIAGNOSIS — Z1211 Encounter for screening for malignant neoplasm of colon: Principal | ICD-10-CM

## 2023-11-20 DIAGNOSIS — Q348 Other specified congenital malformations of respiratory system: Principal | ICD-10-CM

## 2023-11-20 DIAGNOSIS — D801 Nonfamilial hypogammaglobulinemia: Principal | ICD-10-CM

## 2023-11-20 DIAGNOSIS — J4A9 Chronic lung allograft dysfunction (CLAD) (CMS-HCC): Principal | ICD-10-CM

## 2023-11-20 DIAGNOSIS — M85859 Other specified disorders of bone density and structure, unspecified thigh: Principal | ICD-10-CM

## 2023-11-20 DIAGNOSIS — D702 Other drug-induced agranulocytosis: Principal | ICD-10-CM

## 2023-11-26 DIAGNOSIS — B999 Unspecified infectious disease: Principal | ICD-10-CM

## 2023-11-27 ENCOUNTER — Encounter: Admit: 2023-11-27 | Discharge: 2023-11-27 | Payer: BLUE CROSS/BLUE SHIELD | Attending: Family | Primary: Family

## 2023-11-27 DIAGNOSIS — Z942 Lung transplant status: Principal | ICD-10-CM

## 2023-11-27 DIAGNOSIS — R52 Pain, unspecified: Principal | ICD-10-CM

## 2023-11-28 DIAGNOSIS — B999 Unspecified infectious disease: Principal | ICD-10-CM

## 2023-11-30 ENCOUNTER — Ambulatory Visit: Admit: 2023-11-30 | Discharge: 2023-12-01 | Payer: BLUE CROSS/BLUE SHIELD

## 2023-11-30 DIAGNOSIS — B999 Unspecified infectious disease: Principal | ICD-10-CM

## 2023-12-02 ENCOUNTER — Ambulatory Visit: Admit: 2023-12-02 | Discharge: 2023-12-03 | Payer: BLUE CROSS/BLUE SHIELD

## 2023-12-02 DIAGNOSIS — B999 Unspecified infectious disease: Principal | ICD-10-CM

## 2023-12-02 DIAGNOSIS — M109 Gout, unspecified: Principal | ICD-10-CM

## 2023-12-02 MED ORDER — PREDNISONE 10 MG TABLET
ORAL_TABLET | ORAL | 0 refills | 8.00000 days | Status: CP
Start: 2023-12-02 — End: 2023-12-10
  Filled 2023-12-02: qty 20, 8d supply, fill #0

## 2023-12-02 MED FILL — AZITHROMYCIN 250 MG TABLET: ORAL | 90 days supply | Qty: 90 | Fill #1

## 2023-12-15 DIAGNOSIS — B999 Unspecified infectious disease: Principal | ICD-10-CM

## 2023-12-16 DIAGNOSIS — B999 Unspecified infectious disease: Principal | ICD-10-CM

## 2023-12-16 MED ORDER — PREDNISONE 2.5 MG TABLET
ORAL_TABLET | Freq: Every day | ORAL | 3 refills | 90.00000 days | Status: CP
Start: 2023-12-16 — End: ?
  Filled 2023-12-17: qty 90, 90d supply, fill #0

## 2023-12-16 MED ORDER — PREDNISONE 5 MG TABLET
ORAL_TABLET | Freq: Every day | ORAL | 3 refills | 90.00000 days | Status: CP
Start: 2023-12-16 — End: ?
  Filled 2023-12-17: qty 90, 90d supply, fill #0

## 2023-12-16 MED ORDER — ALBUTEROL SULFATE 2.5 MG/3 ML (0.083 %) SOLUTION FOR NEBULIZATION
RESPIRATORY_TRACT | 6 refills | 30.00000 days | Status: CP | PRN
Start: 2023-12-16 — End: 2024-12-15
  Filled 2023-12-17: qty 540, 45d supply, fill #0

## 2023-12-17 DIAGNOSIS — B999 Unspecified infectious disease: Principal | ICD-10-CM

## 2023-12-17 MED ORDER — PREDNISONE 10 MG TABLET
ORAL_TABLET | ORAL | 0 refills | 8.00000 days | Status: CP
Start: 2023-12-17 — End: 2023-12-25

## 2023-12-17 MED FILL — PANTOPRAZOLE 40 MG TABLET,DELAYED RELEASE: ORAL | 90 days supply | Qty: 180 | Fill #2

## 2023-12-17 MED FILL — BUDESONIDE-FORMOTEROL HFA 160 MCG-4.5 MCG/ACTUATION AEROSOL INHALER: RESPIRATORY_TRACT | 30 days supply | Qty: 10.2 | Fill #4

## 2023-12-17 MED FILL — ATORVASTATIN 20 MG TABLET: ORAL | 30 days supply | Qty: 30 | Fill #3

## 2023-12-17 MED FILL — TACROLIMUS 1 MG CAPSULE, IMMEDIATE-RELEASE: ORAL | 30 days supply | Qty: 30 | Fill #5

## 2023-12-17 MED FILL — BD LUER-LOK SYRINGE 10 ML 21 GAUGE X 1": 20 days supply | Qty: 40 | Fill #1

## 2023-12-17 MED FILL — BD LUER-LOK SYRINGE 3 ML 21 GAUGE X 1": 28 days supply | Qty: 56 | Fill #1

## 2023-12-17 MED FILL — AMIKACIN 500 MG/2 ML INJECTION SOLUTION: RESPIRATORY_TRACT | 30 days supply | Qty: 120 | Fill #6

## 2023-12-17 MED FILL — FUROSEMIDE 20 MG TABLET: ORAL | 90 days supply | Qty: 180 | Fill #3

## 2023-12-17 MED FILL — VALSARTAN 40 MG TABLET: ORAL | 30 days supply | Qty: 30 | Fill #9

## 2023-12-17 MED FILL — ALBUTEROL SULFATE HFA 90 MCG/ACTUATION AEROSOL INHALER: RESPIRATORY_TRACT | 25 days supply | Qty: 8.5 | Fill #2

## 2023-12-17 MED FILL — MONTELUKAST 10 MG TABLET: ORAL | 90 days supply | Qty: 90 | Fill #3

## 2023-12-17 MED FILL — TACROLIMUS 0.5 MG CAPSULE, IMMEDIATE-RELEASE: ORAL | 30 days supply | Qty: 30 | Fill #5

## 2023-12-17 MED FILL — SULFAMETHOXAZOLE 400 MG-TRIMETHOPRIM 80 MG TABLET: ORAL | 84 days supply | Qty: 36 | Fill #3

## 2023-12-19 ENCOUNTER — Encounter: Admit: 2023-12-19 | Discharge: 2023-12-19 | Payer: BLUE CROSS/BLUE SHIELD | Attending: Acute Care | Primary: Acute Care

## 2023-12-19 ENCOUNTER — Encounter
Admit: 2023-12-19 | Discharge: 2023-12-19 | Payer: BLUE CROSS/BLUE SHIELD | Attending: Internal Medicine | Primary: Internal Medicine

## 2023-12-19 DIAGNOSIS — B999 Unspecified infectious disease: Principal | ICD-10-CM

## 2023-12-19 MED FILL — WATER FOR INJECTION, STERILE INJECTION SOLUTION: ORAL | 12 days supply | Qty: 250 | Fill #1

## 2023-12-19 MED FILL — PREDNISONE 10 MG TABLET: ORAL | 8 days supply | Qty: 20 | Fill #0

## 2023-12-23 DIAGNOSIS — B999 Unspecified infectious disease: Principal | ICD-10-CM

## 2023-12-24 ENCOUNTER — Ambulatory Visit: Admit: 2023-12-24 | Discharge: 2023-12-24 | Payer: BLUE CROSS/BLUE SHIELD

## 2023-12-24 ENCOUNTER — Ambulatory Visit: Admit: 2023-12-24 | Discharge: 2023-12-24 | Payer: BLUE CROSS/BLUE SHIELD | Attending: Family | Primary: Family

## 2023-12-24 ENCOUNTER — Inpatient Hospital Stay: Admit: 2023-12-24 | Discharge: 2023-12-24 | Payer: BLUE CROSS/BLUE SHIELD

## 2023-12-24 DIAGNOSIS — M109 Gout, unspecified: Principal | ICD-10-CM

## 2023-12-30 DIAGNOSIS — B999 Unspecified infectious disease: Principal | ICD-10-CM

## 2023-12-30 DIAGNOSIS — Z942 Lung transplant status: Principal | ICD-10-CM

## 2023-12-30 DIAGNOSIS — M1A0320 Idiopathic chronic gout, left wrist, without tophus (tophi): Principal | ICD-10-CM

## 2023-12-30 MED ORDER — FEBUXOSTAT 40 MG TABLET
ORAL_TABLET | Freq: Every day | ORAL | 3 refills | 90.00000 days | Status: CP
Start: 2023-12-30 — End: 2024-12-29
  Filled 2024-01-01: qty 90, 30d supply, fill #0

## 2023-12-30 MED ORDER — MYCOPHENOLATE MOFETIL 250 MG CAPSULE
ORAL_CAPSULE | Freq: Two times a day (BID) | ORAL | 3 refills | 90.00000 days | Status: CP
Start: 2023-12-30 — End: 2024-12-29
  Filled 2024-01-01: qty 60, 30d supply, fill #0

## 2024-01-01 ENCOUNTER — Encounter: Admit: 2024-01-01 | Discharge: 2024-01-01 | Payer: BLUE CROSS/BLUE SHIELD

## 2024-01-01 DIAGNOSIS — R059 Cough, unspecified type: Principal | ICD-10-CM

## 2024-01-01 DIAGNOSIS — Z942 Lung transplant status: Principal | ICD-10-CM

## 2024-01-01 DIAGNOSIS — B999 Unspecified infectious disease: Principal | ICD-10-CM

## 2024-01-01 MED FILL — JANUVIA 50 MG TABLET: ORAL | 90 days supply | Qty: 90 | Fill #2

## 2024-01-02 DIAGNOSIS — B999 Unspecified infectious disease: Principal | ICD-10-CM

## 2024-01-05 MED ORDER — PREDNISONE 10 MG TABLET
ORAL_TABLET | ORAL | 0 refills | 8.00000 days | Status: CP
Start: 2024-01-05 — End: 2024-01-13
  Filled 2024-01-06: qty 20, 8d supply, fill #0

## 2024-01-06 ENCOUNTER — Ambulatory Visit
Admit: 2024-01-06 | Discharge: 2024-01-06 | Payer: BLUE CROSS/BLUE SHIELD | Attending: Pulmonary Disease | Primary: Pulmonary Disease

## 2024-01-06 ENCOUNTER — Ambulatory Visit: Admit: 2024-01-06 | Discharge: 2024-01-06 | Payer: BLUE CROSS/BLUE SHIELD

## 2024-01-06 DIAGNOSIS — R062 Wheezing: Principal | ICD-10-CM

## 2024-01-06 DIAGNOSIS — J189 Pneumonia, unspecified organism: Principal | ICD-10-CM

## 2024-01-06 DIAGNOSIS — B999 Unspecified infectious disease: Principal | ICD-10-CM

## 2024-01-06 DIAGNOSIS — J4A9 Chronic lung allograft dysfunction (CLAD) (CMS-HCC): Principal | ICD-10-CM

## 2024-01-06 DIAGNOSIS — R0609 Other forms of dyspnea: Principal | ICD-10-CM

## 2024-01-06 DIAGNOSIS — Z942 Lung transplant status: Principal | ICD-10-CM

## 2024-01-06 DIAGNOSIS — R058 Productive cough: Principal | ICD-10-CM

## 2024-01-06 DIAGNOSIS — J3489 Other specified disorders of nose and nasal sinuses: Principal | ICD-10-CM

## 2024-01-07 DIAGNOSIS — B999 Unspecified infectious disease: Principal | ICD-10-CM

## 2024-01-08 ENCOUNTER — Inpatient Hospital Stay: Admit: 2024-01-08 | Discharge: 2024-01-08 | Payer: BLUE CROSS/BLUE SHIELD

## 2024-01-12 DIAGNOSIS — B999 Unspecified infectious disease: Principal | ICD-10-CM

## 2024-01-13 DIAGNOSIS — J189 Pneumonia, unspecified organism: Principal | ICD-10-CM

## 2024-01-13 DIAGNOSIS — B999 Unspecified infectious disease: Principal | ICD-10-CM

## 2024-01-14 DIAGNOSIS — R0609 Other forms of dyspnea: Principal | ICD-10-CM

## 2024-01-14 DIAGNOSIS — Z942 Lung transplant status: Principal | ICD-10-CM

## 2024-01-14 DIAGNOSIS — J4A9 Chronic lung allograft dysfunction (CLAD) (CMS-HCC): Principal | ICD-10-CM

## 2024-01-14 MED ORDER — AVYCAZ 2.5 GRAM INTRAVENOUS SOLUTION
Freq: Three times a day (TID) | INTRAVENOUS | 0 refills | 14.00000 days | Status: CP
Start: 2024-01-14 — End: 2024-01-28

## 2024-01-16 ENCOUNTER — Ambulatory Visit
Admit: 2024-01-16 | Discharge: 2024-01-16 | Payer: BLUE CROSS/BLUE SHIELD | Attending: Internal Medicine | Primary: Internal Medicine

## 2024-01-16 ENCOUNTER — Encounter: Admit: 2024-01-16 | Discharge: 2024-01-16 | Payer: BLUE CROSS/BLUE SHIELD

## 2024-01-16 ENCOUNTER — Inpatient Hospital Stay: Admit: 2024-01-16 | Discharge: 2024-01-16 | Payer: BLUE CROSS/BLUE SHIELD

## 2024-01-16 DIAGNOSIS — J4A9 Chronic lung allograft dysfunction (CLAD) (CMS-HCC): Principal | ICD-10-CM

## 2024-01-16 DIAGNOSIS — Z942 Lung transplant status: Principal | ICD-10-CM

## 2024-01-16 DIAGNOSIS — D801 Nonfamilial hypogammaglobulinemia: Principal | ICD-10-CM

## 2024-01-16 DIAGNOSIS — B999 Unspecified infectious disease: Principal | ICD-10-CM

## 2024-01-16 DIAGNOSIS — Z79899 Other long term (current) drug therapy: Principal | ICD-10-CM

## 2024-01-16 DIAGNOSIS — Q348 Other specified congenital malformations of respiratory system: Principal | ICD-10-CM

## 2024-01-16 MED FILL — TACROLIMUS 1 MG CAPSULE, IMMEDIATE-RELEASE: ORAL | 30 days supply | Qty: 30 | Fill #6

## 2024-01-16 MED FILL — TACROLIMUS 0.5 MG CAPSULE, IMMEDIATE-RELEASE: ORAL | 30 days supply | Qty: 30 | Fill #6

## 2024-01-16 MED FILL — BUDESONIDE-FORMOTEROL HFA 160 MCG-4.5 MCG/ACTUATION AEROSOL INHALER: RESPIRATORY_TRACT | 30 days supply | Qty: 10.2 | Fill #5

## 2024-01-16 MED FILL — ATORVASTATIN 20 MG TABLET: ORAL | 30 days supply | Qty: 30 | Fill #4

## 2024-01-16 MED FILL — ALBUTEROL SULFATE HFA 90 MCG/ACTUATION AEROSOL INHALER: RESPIRATORY_TRACT | 25 days supply | Qty: 8.5 | Fill #3

## 2024-01-16 MED FILL — VALSARTAN 40 MG TABLET: ORAL | 30 days supply | Qty: 30 | Fill #10

## 2024-01-17 DIAGNOSIS — B999 Unspecified infectious disease: Principal | ICD-10-CM

## 2024-01-20 MED ORDER — AVYCAZ 2.5 GRAM INTRAVENOUS SOLUTION
Freq: Three times a day (TID) | INTRAVENOUS | PRN refills | 6.00000 days | Status: CP
Start: 2024-01-20 — End: 2024-02-04

## 2024-01-21 ENCOUNTER — Ambulatory Visit: Admit: 2024-01-21 | Discharge: 2024-01-22 | Payer: BLUE CROSS/BLUE SHIELD

## 2024-01-21 DIAGNOSIS — B999 Unspecified infectious disease: Principal | ICD-10-CM

## 2024-01-21 DIAGNOSIS — Z942 Lung transplant status: Principal | ICD-10-CM

## 2024-01-21 MED ORDER — TACROLIMUS 0.5 MG CAPSULE, IMMEDIATE-RELEASE
ORAL_CAPSULE | Freq: Two times a day (BID) | ORAL | 3 refills | 90.00000 days | Status: CP
Start: 2024-01-21 — End: 2025-01-20
  Filled 2024-01-28: qty 60, 30d supply, fill #0

## 2024-01-22 DIAGNOSIS — B999 Unspecified infectious disease: Principal | ICD-10-CM

## 2024-01-22 DIAGNOSIS — J189 Pneumonia, unspecified organism: Principal | ICD-10-CM

## 2024-01-22 MED ORDER — AVYCAZ 2.5 GRAM INTRAVENOUS SOLUTION
Freq: Three times a day (TID) | INTRAVENOUS | PRN refills | 6.00000 days | Status: CP
Start: 2024-01-22 — End: 2024-01-28

## 2024-01-23 DIAGNOSIS — B999 Unspecified infectious disease: Principal | ICD-10-CM

## 2024-01-23 DIAGNOSIS — D801 Nonfamilial hypogammaglobulinemia: Principal | ICD-10-CM

## 2024-01-23 DIAGNOSIS — Z942 Lung transplant status: Principal | ICD-10-CM

## 2024-01-27 DIAGNOSIS — B999 Unspecified infectious disease: Principal | ICD-10-CM

## 2024-01-27 DIAGNOSIS — J189 Pneumonia, unspecified organism: Principal | ICD-10-CM

## 2024-01-28 DIAGNOSIS — B999 Unspecified infectious disease: Principal | ICD-10-CM

## 2024-01-28 MED FILL — FEBUXOSTAT 40 MG TABLET: ORAL | 30 days supply | Qty: 90 | Fill #1

## 2024-01-28 MED FILL — VALGANCICLOVIR 450 MG TABLET: ORAL | 90 days supply | Qty: 90 | Fill #1

## 2024-01-28 MED FILL — ALBUTEROL SULFATE 2.5 MG/3 ML (0.083 %) SOLUTION FOR NEBULIZATION: RESPIRATORY_TRACT | 45 days supply | Qty: 540 | Fill #1

## 2024-01-28 MED FILL — MYCOPHENOLATE MOFETIL 250 MG CAPSULE: ORAL | 30 days supply | Qty: 60 | Fill #1

## 2024-01-29 DIAGNOSIS — B999 Unspecified infectious disease: Principal | ICD-10-CM

## 2024-02-04 ENCOUNTER — Inpatient Hospital Stay: Admit: 2024-02-04 | Discharge: 2024-02-04 | Payer: BLUE CROSS/BLUE SHIELD

## 2024-02-04 ENCOUNTER — Ambulatory Visit: Admit: 2024-02-04 | Discharge: 2024-02-04 | Payer: BLUE CROSS/BLUE SHIELD

## 2024-02-04 ENCOUNTER — Inpatient Hospital Stay: Admit: 2024-02-04 | Discharge: 2024-02-04 | Payer: Medicaid (Managed Care)

## 2024-02-05 DIAGNOSIS — B999 Unspecified infectious disease: Principal | ICD-10-CM

## 2024-02-05 MED ORDER — PREDNISONE 20 MG TABLET
ORAL_TABLET | Freq: Every day | ORAL | 0 refills | 10.00000 days | Status: CP
Start: 2024-02-05 — End: 2024-02-15
  Filled 2024-02-05: qty 20, 10d supply, fill #0

## 2024-02-05 MED FILL — ALBUTEROL SULFATE HFA 90 MCG/ACTUATION AEROSOL INHALER: RESPIRATORY_TRACT | 25 days supply | Qty: 8.5 | Fill #4

## 2024-02-10 DIAGNOSIS — B999 Unspecified infectious disease: Principal | ICD-10-CM

## 2024-02-11 MED ORDER — VALSARTAN 40 MG TABLET
ORAL_TABLET | Freq: Every day | ORAL | 11 refills | 30.00000 days | Status: CP
Start: 2024-02-11 — End: 2025-02-10
  Filled 2024-02-12: qty 30, 30d supply, fill #0

## 2024-02-12 MED FILL — ATORVASTATIN 20 MG TABLET: ORAL | 30 days supply | Qty: 30 | Fill #5

## 2024-02-12 MED FILL — BUDESONIDE-FORMOTEROL HFA 160 MCG-4.5 MCG/ACTUATION AEROSOL INHALER: RESPIRATORY_TRACT | 30 days supply | Qty: 10.2 | Fill #6

## 2024-02-13 DIAGNOSIS — B999 Unspecified infectious disease: Principal | ICD-10-CM

## 2024-02-15 DIAGNOSIS — B999 Unspecified infectious disease: Principal | ICD-10-CM

## 2024-02-16 DIAGNOSIS — B999 Unspecified infectious disease: Principal | ICD-10-CM

## 2024-02-17 DIAGNOSIS — B999 Unspecified infectious disease: Principal | ICD-10-CM

## 2024-02-20 DIAGNOSIS — B999 Unspecified infectious disease: Principal | ICD-10-CM

## 2024-02-24 DIAGNOSIS — B999 Unspecified infectious disease: Principal | ICD-10-CM

## 2024-02-25 DIAGNOSIS — B999 Unspecified infectious disease: Principal | ICD-10-CM

## 2024-02-26 DIAGNOSIS — B999 Unspecified infectious disease: Principal | ICD-10-CM

## 2024-02-27 DIAGNOSIS — M109 Gout, unspecified: Principal | ICD-10-CM

## 2024-02-27 DIAGNOSIS — R799 Abnormal finding of blood chemistry, unspecified: Principal | ICD-10-CM

## 2024-02-27 DIAGNOSIS — E669 Obesity, unspecified: Principal | ICD-10-CM

## 2024-02-27 DIAGNOSIS — Z1211 Encounter for screening for malignant neoplasm of colon: Principal | ICD-10-CM

## 2024-02-27 DIAGNOSIS — Z7689 Persons encountering health services in other specified circumstances: Principal | ICD-10-CM

## 2024-02-27 DIAGNOSIS — Q348 Other specified congenital malformations of respiratory system: Principal | ICD-10-CM

## 2024-02-27 DIAGNOSIS — Z2239 Carrier of other specified bacterial diseases: Principal | ICD-10-CM

## 2024-02-27 DIAGNOSIS — B999 Unspecified infectious disease: Principal | ICD-10-CM

## 2024-02-27 DIAGNOSIS — N1832 Stage 3b chronic kidney disease (CMS-HCC): Principal | ICD-10-CM

## 2024-02-27 DIAGNOSIS — Z942 Lung transplant status: Principal | ICD-10-CM

## 2024-02-29 DIAGNOSIS — B999 Unspecified infectious disease: Principal | ICD-10-CM

## 2024-03-02 DIAGNOSIS — B999 Unspecified infectious disease: Principal | ICD-10-CM

## 2024-03-03 DIAGNOSIS — B999 Unspecified infectious disease: Principal | ICD-10-CM

## 2024-03-04 DIAGNOSIS — D801 Nonfamilial hypogammaglobulinemia: Principal | ICD-10-CM

## 2024-03-04 DIAGNOSIS — B999 Unspecified infectious disease: Principal | ICD-10-CM

## 2024-03-04 DIAGNOSIS — M1A0320 Idiopathic chronic gout, left wrist, without tophus (tophi): Principal | ICD-10-CM

## 2024-03-04 DIAGNOSIS — Z942 Lung transplant status: Principal | ICD-10-CM

## 2024-03-06 DIAGNOSIS — Z8701 Personal history of pneumonia (recurrent): Secondary | ICD-10-CM

## 2024-03-06 DIAGNOSIS — Z942 Lung transplant status: Principal | ICD-10-CM

## 2024-03-06 DIAGNOSIS — B999 Unspecified infectious disease: Secondary | ICD-10-CM

## 2024-03-06 MED ORDER — COLISTIMETHATE (COLISTIN) INHALATION
Freq: Two times a day (BID) | RESPIRATORY_TRACT | 0 refills | 0.00000 days | Status: CP
Start: 2024-03-06 — End: ?

## 2024-03-07 MED FILL — AZITHROMYCIN 250 MG TABLET: ORAL | 90 days supply | Qty: 90 | Fill #2

## 2024-03-07 MED FILL — MYCOPHENOLATE MOFETIL 250 MG CAPSULE: ORAL | 30 days supply | Qty: 60 | Fill #2

## 2024-03-07 MED FILL — POSACONAZOLE 100 MG TABLET,DELAYED RELEASE: ORAL | 3 days supply | Qty: 9 | Fill #1

## 2024-03-07 MED FILL — TACROLIMUS 0.5 MG CAPSULE, IMMEDIATE-RELEASE: ORAL | 30 days supply | Qty: 60 | Fill #1

## 2024-03-07 MED FILL — FEBUXOSTAT 40 MG TABLET: ORAL | 3 days supply | Qty: 9 | Fill #2

## 2024-03-07 MED FILL — ALBUTEROL SULFATE HFA 90 MCG/ACTUATION AEROSOL INHALER: RESPIRATORY_TRACT | 25 days supply | Qty: 8.5 | Fill #5

## 2024-03-09 DIAGNOSIS — Z942 Lung transplant status: Principal | ICD-10-CM

## 2024-03-09 DIAGNOSIS — B999 Unspecified infectious disease: Secondary | ICD-10-CM

## 2024-03-10 ENCOUNTER — Inpatient Hospital Stay: Admit: 2024-03-10 | Discharge: 2024-03-10 | Payer: BLUE CROSS/BLUE SHIELD

## 2024-03-10 ENCOUNTER — Ambulatory Visit
Admit: 2024-03-10 | Discharge: 2024-03-10 | Payer: BLUE CROSS/BLUE SHIELD | Attending: Pulmonary Disease | Primary: Pulmonary Disease

## 2024-03-10 ENCOUNTER — Ambulatory Visit: Admit: 2024-03-10 | Discharge: 2024-03-10 | Payer: BLUE CROSS/BLUE SHIELD

## 2024-03-10 DIAGNOSIS — B999 Unspecified infectious disease: Secondary | ICD-10-CM

## 2024-03-10 DIAGNOSIS — M1A0320 Idiopathic chronic gout, left wrist, without tophus (tophi): Principal | ICD-10-CM

## 2024-03-10 MED ORDER — FEBUXOSTAT 40 MG TABLET
ORAL_TABLET | Freq: Every day | ORAL | 3 refills | 90.00000 days | Status: CP
Start: 2024-03-10 — End: 2025-03-10
  Filled 2024-03-16: qty 90, 30d supply, fill #0

## 2024-03-10 MED FILL — POSACONAZOLE 100 MG TABLET,DELAYED RELEASE: ORAL | 1 days supply | Qty: 3 | Fill #2

## 2024-03-11 ENCOUNTER — Inpatient Hospital Stay: Admit: 2024-03-11 | Discharge: 2024-03-12 | Payer: BLUE CROSS/BLUE SHIELD

## 2024-03-11 ENCOUNTER — Ambulatory Visit: Admit: 2024-03-11 | Discharge: 2024-03-12 | Payer: BLUE CROSS/BLUE SHIELD

## 2024-03-11 ENCOUNTER — Inpatient Hospital Stay: Admit: 2024-03-11 | Discharge: 2024-03-13 | Payer: BLUE CROSS/BLUE SHIELD

## 2024-03-11 DIAGNOSIS — R0609 Other forms of dyspnea: Secondary | ICD-10-CM

## 2024-03-11 DIAGNOSIS — Z942 Lung transplant status: Principal | ICD-10-CM

## 2024-03-11 DIAGNOSIS — B999 Unspecified infectious disease: Principal | ICD-10-CM

## 2024-03-11 MED FILL — POSACONAZOLE 100 MG TABLET,DELAYED RELEASE: ORAL | 90 days supply | Qty: 270 | Fill #3

## 2024-03-11 MED FILL — PANTOPRAZOLE 40 MG TABLET,DELAYED RELEASE: ORAL | 90 days supply | Qty: 180 | Fill #3

## 2024-03-12 ENCOUNTER — Ambulatory Visit
Admission: EM | Admit: 2024-03-12 | Discharge: 2024-03-16 | Disposition: A | Discharge status: Home Health | Payer: BLUE CROSS/BLUE SHIELD | Admitting: Internal Medicine

## 2024-03-12 ENCOUNTER — Inpatient Hospital Stay
Admission: EM | Admit: 2024-03-12 | Discharge: 2024-03-16 | Disposition: A | Discharge status: Home Health | Payer: BLUE CROSS/BLUE SHIELD | Admitting: Internal Medicine

## 2024-03-12 DIAGNOSIS — B999 Unspecified infectious disease: Principal | ICD-10-CM

## 2024-03-14 MED ORDER — ATOVAQUONE 750 MG/5 ML ORAL SUSPENSION
Freq: Every day | ORAL | 11 refills | 30.00000 days
Start: 2024-03-14 — End: ?

## 2024-03-16 MED ORDER — ATOVAQUONE 750 MG/5 ML ORAL SUSPENSION
Freq: Every day | ORAL | 0 refills | 30.00000 days | Status: CP
Start: 2024-03-16 — End: 2024-03-16
  Filled 2024-03-16: qty 210, 21d supply, fill #0

## 2024-03-16 MED ORDER — CARBAMIDE PEROXIDE 6.5 % EAR DROPS
Freq: Two times a day (BID) | OTIC | 0 refills | 0.00000 days | Status: CP
Start: 2024-03-16 — End: ?

## 2024-03-16 MED ORDER — SODIUM CHLORIDE 3 % FOR NEBULIZATION
Freq: Four times a day (QID) | RESPIRATORY_TRACT | 0 refills | 15.00000 days | Status: CP | PRN
Start: 2024-03-16 — End: 2024-04-15
  Filled 2024-03-16: qty 240, 15d supply, fill #0

## 2024-03-17 ENCOUNTER — Inpatient Hospital Stay: Admit: 2024-03-17 | Payer: BLUE CROSS/BLUE SHIELD

## 2024-03-17 DIAGNOSIS — B999 Unspecified infectious disease: Principal | ICD-10-CM

## 2024-03-17 MED ORDER — PREDNISONE 5 MG TABLET
ORAL_TABLET | ORAL | 0 refills | 8.00000 days | Status: CP
Start: 2024-03-17 — End: 2024-03-25
  Filled 2024-03-16: qty 23, 8d supply, fill #0

## 2024-03-17 MED ORDER — FUROSEMIDE 20 MG TABLET
ORAL_TABLET | Freq: Every day | ORAL | 3 refills | 90.00000 days | Status: CP
Start: 2024-03-17 — End: 2025-03-17

## 2024-03-18 ENCOUNTER — Emergency Department
Admit: 2024-03-18 | Discharge: 2024-03-18 | Disposition: A | Discharge status: Home / Self Care | Payer: BLUE CROSS/BLUE SHIELD

## 2024-03-18 DIAGNOSIS — B999 Unspecified infectious disease: Principal | ICD-10-CM

## 2024-03-18 MED ORDER — HYDRALAZINE 50 MG TABLET
ORAL_TABLET | Freq: Three times a day (TID) | ORAL | 3 refills | 15.00000 days | Status: CP | PRN
Start: 2024-03-18 — End: ?
